# Patient Record
Sex: Female | Born: 1995 | Race: White | Hispanic: No | Smoking: Current every day smoker
Health system: Southern US, Community
[De-identification: ages and names within clinical notes are randomized; demographics above are authoritative.]

## PROBLEM LIST (undated history)

## (undated) ENCOUNTER — Inpatient Hospital Stay: Payer: Self-pay

## (undated) DIAGNOSIS — F909 Attention-deficit hyperactivity disorder, unspecified type: Secondary | ICD-10-CM

## (undated) DIAGNOSIS — K219 Gastro-esophageal reflux disease without esophagitis: Secondary | ICD-10-CM

## (undated) DIAGNOSIS — K589 Irritable bowel syndrome without diarrhea: Secondary | ICD-10-CM

## (undated) DIAGNOSIS — F419 Anxiety disorder, unspecified: Secondary | ICD-10-CM

## (undated) DIAGNOSIS — Z8614 Personal history of Methicillin resistant Staphylococcus aureus infection: Secondary | ICD-10-CM

## (undated) DIAGNOSIS — F329 Major depressive disorder, single episode, unspecified: Secondary | ICD-10-CM

## (undated) DIAGNOSIS — F32A Depression, unspecified: Secondary | ICD-10-CM

## (undated) DIAGNOSIS — R51 Headache: Secondary | ICD-10-CM

## (undated) DIAGNOSIS — J45909 Unspecified asthma, uncomplicated: Secondary | ICD-10-CM

## (undated) DIAGNOSIS — N189 Chronic kidney disease, unspecified: Secondary | ICD-10-CM

## (undated) DIAGNOSIS — N390 Urinary tract infection, site not specified: Secondary | ICD-10-CM

## (undated) DIAGNOSIS — N12 Tubulo-interstitial nephritis, not specified as acute or chronic: Secondary | ICD-10-CM

## (undated) DIAGNOSIS — R519 Headache, unspecified: Secondary | ICD-10-CM

## (undated) DIAGNOSIS — F4310 Post-traumatic stress disorder, unspecified: Secondary | ICD-10-CM

## (undated) DIAGNOSIS — G589 Mononeuropathy, unspecified: Secondary | ICD-10-CM

## (undated) HISTORY — DX: Post-traumatic stress disorder, unspecified: F43.10

## (undated) HISTORY — DX: Unspecified asthma, uncomplicated: J45.909

## (undated) HISTORY — PX: KIDNEY SURGERY: SHX687

## (undated) HISTORY — DX: Attention-deficit hyperactivity disorder, unspecified type: F90.9

## (undated) HISTORY — DX: Urinary tract infection, site not specified: N39.0

## (undated) HISTORY — DX: Irritable bowel syndrome, unspecified: K58.9

---
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
## 2014-03-12 ENCOUNTER — Emergency Department: Payer: Self-pay | Admitting: Emergency Medicine

## 2014-07-12 ENCOUNTER — Emergency Department: Payer: Self-pay | Admitting: Emergency Medicine

## 2014-07-12 LAB — URINALYSIS, COMPLETE
BILIRUBIN, UR: NEGATIVE
Blood: NEGATIVE
Glucose,UR: NEGATIVE mg/dL (ref 0–75)
Hyaline Cast: 2
Nitrite: NEGATIVE
PH: 5 (ref 4.5–8.0)
RBC,UR: 27 /HPF (ref 0–5)
Specific Gravity: 1.033 (ref 1.003–1.030)
Squamous Epithelial: 13
WBC UR: 18 /HPF (ref 0–5)

## 2014-07-12 LAB — PREGNANCY, URINE: Pregnancy Test, Urine: NEGATIVE m[IU]/mL

## 2014-07-14 LAB — URINE CULTURE

## 2014-08-14 ENCOUNTER — Emergency Department: Payer: Self-pay | Admitting: Emergency Medicine

## 2014-09-27 ENCOUNTER — Emergency Department: Payer: Self-pay | Admitting: Emergency Medicine

## 2014-11-23 NOTE — H&P (Signed)
L&D Evaluation:  History:  HPI 19 yo G1 @ 5275w5d gestation by 6wk US derived EDD of 02/13/13 presenting to clinic reporting intermitten menstrual type cramping and loss of mucous plug yesterday.  Noted to be 1cm dilated on susbequent exam, FFN was collected.  No LOF, no VB.  O positive / ABSC neg / RI / VZU / RPR NR / HIV neg / HBsAg neg / 1st Trimester screen negative / 1-hr OGTT 85   Patient's Medical History No Chronic Illness   Patient's Surgical History none   Medications Pre Natal Vitamins   Allergies NKDA   Social History none   Family History Non-Contributory   ROS:  ROS All systems were reviewed.  HEENT, CNS, GI, GU, Respiratory, CV, Renal and Musculoskeletal systems were found to be normal.   Exam:  Vital Signs stable   General no apparent distress   Abdomen gravid, non-tender   Estimated Fetal Weight Average for gestational age   Edema no edema   Pelvic 1/50/-3 in clinic   Mebranes Intact   FHT normal rate with no decels   Ucx absent   Impression:  Impression PTL   Plan:  Plan EFM/NST, monitor contractions and for cervical change   Comments - FFN sent - start BMZ  - Depending on whether monitoring reveals any contractions or cervical change will detmine whetehr to follow outpatient or continue inpatient monitoring.   Electronic Signatures: Lorrene ReidStaebler, Deadrian Toya M (MD)  (Signed 21-May-14 11:28)  Authored: L&D Evaluation   Last Updated: 21-May-14 11:28 by Lorrene ReidStaebler, Ludwika Rodd M (MD)

## 2014-11-23 NOTE — H&P (Signed)
L&D Evaluation:  History:  HPI 19 yo G1 at 583w6d gestational age by 6 week ultrasound. Pregnancy complicated by preterm labor at 31 weeks. She is status post BMTZ 1 week ago for preterm labor. She has been having contractions for several weeks, which she states have not changed in intensity as of today.  She presented to clinic this afternoon after noticing vaginal bleeding. Of note, she had sexual intercourse this morning. She had a speculum exam that did reveal a small amount of blood in the vaginal vault.  Her cervix was checked and found to be 2 cm (up from 1 cm 1 week ago).  She was sent to L&D for further evaluation.  She notes positive fetal movement, she thinks her bleeding has slowed down.  She notes contractions, as above.  Denies leakage of fluid.   O+ / VZI / HBsAg neg / RI / RPR NR / GBS unk   Patient's Medical History No Chronic Illness   Patient's Surgical History bladder surgery   Medications Pre Natal Vitamins  flexeril   Allergies amoxicillin/PCN   Social History none   Family History Non-Contributory   ROS:  ROS All systems were reviewed.  HEENT, CNS, GI, GU, Respiratory, CV, Renal and Musculoskeletal systems were found to be normal., unless noted in HPI   Exam:  Vital Signs stable   Urine Protein not completed   General no apparent distress   Mental Status clear   Chest clear   Heart normal sinus rhythm   Abdomen gravid, non-tender   Estimated Fetal Weight Average for gestational age   Fetal Position cephalic by ultrasound   Back no CVAT   Edema no edema   Pelvic no external lesions, on sterile speculum exam there was ~5-5510mL blood (not dark brown, but not bright red either). No active lesions noteed, no active bleeding from Os identified.  Cvx 2/30/-3   Mebranes Intact   FHT normal rate with no decels   Ucx irregular   Other Bedside ultrasound performed by me showed fetus in cephalic presentation, grossly normal fluid, +fetal movement.   Placenta in patients right.  Edges evaluated and no obvious abruption, active bleeding, or hematomas noted.   Impression:  Impression 1) Intrauterine pregnancy at 363w6d gestational age 72) preterm labor (not laboring currently), 3) third trimester vaginal bleeding, source unknown   Plan:  Plan EFM/NST, monitor contractions and for cervical change   Comments * Cervical testing for gonorrhea/chlamydia sent * recommended to patient to stay in house for observation with concern for abruption since I could not definitively point to a source of her bleeding.  I explained that if her bleeding subsided, then would could more safely discharge her.  The patient explained that she had her mother's car and needed to pick her mother up and that she needed to leave.  Given her situation and her unknown cause for her ongoing bleeding in the 3rd trimester, I recommended that she stay in-house.  The patient then stated that she needed to leave. I reviewed the potiential ramifications of the patient leaving the hospital against medical advice, including but not limited to fetal and/or maternal death in the worst case.  She expressed understanding of this as a risk.  She asked what sort of followup I would recommend. I recommended that she be seen tomorrow in clinic to assess her bleeding at that time.  The patient agreed to this alternative plan given her desire to leave against medical advice.   Follow Up  Appointment need to schedule. tomorrow.   Electronic Signatures: Conard NovakJackson, Ruble Pumphrey D (MD)  (Signed 05-Jun-14 21:18)  Authored: L&D Evaluation   Last Updated: 05-Jun-14 21:18 by Conard NovakJackson, Cyniah Gossard D (MD)

## 2015-03-05 ENCOUNTER — Encounter: Payer: Self-pay | Admitting: Emergency Medicine

## 2015-03-05 ENCOUNTER — Emergency Department
Admission: EM | Admit: 2015-03-05 | Discharge: 2015-03-05 | Disposition: A | Discharge status: Home / Self Care | Payer: Medicaid Other | Attending: Emergency Medicine | Admitting: Emergency Medicine

## 2015-03-05 DIAGNOSIS — Z720 Tobacco use: Secondary | ICD-10-CM | POA: Insufficient documentation

## 2015-03-05 DIAGNOSIS — N739 Female pelvic inflammatory disease, unspecified: Secondary | ICD-10-CM | POA: Insufficient documentation

## 2015-03-05 DIAGNOSIS — Z880 Allergy status to penicillin: Secondary | ICD-10-CM | POA: Insufficient documentation

## 2015-03-05 DIAGNOSIS — R51 Headache: Secondary | ICD-10-CM | POA: Insufficient documentation

## 2015-03-05 DIAGNOSIS — R1030 Lower abdominal pain, unspecified: Secondary | ICD-10-CM | POA: Diagnosis present

## 2015-03-05 DIAGNOSIS — Z3202 Encounter for pregnancy test, result negative: Secondary | ICD-10-CM | POA: Insufficient documentation

## 2015-03-05 DIAGNOSIS — R519 Headache, unspecified: Secondary | ICD-10-CM

## 2015-03-05 HISTORY — DX: Tubulo-interstitial nephritis, not specified as acute or chronic: N12

## 2015-03-05 LAB — CBC WITH DIFFERENTIAL/PLATELET
Basophils Absolute: 0.1 10*3/uL (ref 0–0.1)
Basophils Relative: 1 %
Eosinophils Absolute: 0.3 10*3/uL (ref 0–0.7)
Eosinophils Relative: 3 %
HEMATOCRIT: 43.1 % (ref 35.0–47.0)
HEMOGLOBIN: 14.6 g/dL (ref 12.0–16.0)
LYMPHS ABS: 2.1 10*3/uL (ref 1.0–3.6)
Lymphocytes Relative: 22 %
MCH: 30.2 pg (ref 26.0–34.0)
MCHC: 33.8 g/dL (ref 32.0–36.0)
MCV: 89.4 fL (ref 80.0–100.0)
MONOS PCT: 6 %
Monocytes Absolute: 0.6 10*3/uL (ref 0.2–0.9)
NEUTROS ABS: 6.8 10*3/uL — AB (ref 1.4–6.5)
NEUTROS PCT: 68 %
Platelets: 241 10*3/uL (ref 150–440)
RBC: 4.82 MIL/uL (ref 3.80–5.20)
RDW: 13.3 % (ref 11.5–14.5)
WBC: 9.9 10*3/uL (ref 3.6–11.0)

## 2015-03-05 LAB — URINALYSIS COMPLETE WITH MICROSCOPIC (ARMC ONLY)
BACTERIA UA: NONE SEEN
Bilirubin Urine: NEGATIVE
GLUCOSE, UA: NEGATIVE mg/dL
Hgb urine dipstick: NEGATIVE
Ketones, ur: NEGATIVE mg/dL
NITRITE: NEGATIVE
Protein, ur: NEGATIVE mg/dL
SPECIFIC GRAVITY, URINE: 1.023 (ref 1.005–1.030)
pH: 5 (ref 5.0–8.0)

## 2015-03-05 LAB — COMPREHENSIVE METABOLIC PANEL
ALBUMIN: 4.4 g/dL (ref 3.5–5.0)
ALK PHOS: 88 U/L (ref 38–126)
ALT: 23 U/L (ref 14–54)
ANION GAP: 7 (ref 5–15)
AST: 24 U/L (ref 15–41)
BILIRUBIN TOTAL: 0.2 mg/dL — AB (ref 0.3–1.2)
BUN: 12 mg/dL (ref 6–20)
CALCIUM: 9.2 mg/dL (ref 8.9–10.3)
CO2: 26 mmol/L (ref 22–32)
CREATININE: 0.78 mg/dL (ref 0.44–1.00)
Chloride: 107 mmol/L (ref 101–111)
GFR calc non Af Amer: >60 mL/min (ref >60)
GLUCOSE: 95 mg/dL (ref 65–99)
Potassium: 3.9 mmol/L (ref 3.5–5.1)
Sodium: 140 mmol/L (ref 135–145)
TOTAL PROTEIN: 7.4 g/dL (ref 6.5–8.1)

## 2015-03-05 LAB — WET PREP, GENITAL
CLUE CELLS WET PREP: NONE SEEN
TRICH WET PREP: NONE SEEN
YEAST WET PREP: NONE SEEN

## 2015-03-05 LAB — CHLAMYDIA/NGC RT PCR (ARMC ONLY)
Chlamydia Tr: DETECTED — AB
N gonorrhoeae: NOT DETECTED

## 2015-03-05 LAB — POCT PREGNANCY, URINE: PREG TEST UR: NEGATIVE

## 2015-03-05 LAB — LIPASE, BLOOD: Lipase: 20 U/L — ABNORMAL LOW (ref 22–51)

## 2015-03-05 MED ORDER — DOXYCYCLINE HYCLATE 100 MG PO TABS: 100.0000 mg | ORAL_TABLET | Freq: Two times a day (BID) | ORAL | Status: DC

## 2015-03-05 MED ORDER — AZITHROMYCIN 250 MG PO TABS
1000.0000 mg | ORAL_TABLET | Freq: Once | ORAL | Status: AC
  Administered 2015-03-05: 1000 mg via ORAL
  Filled 2015-03-05: qty 4

## 2015-03-05 MED ORDER — KETOROLAC TROMETHAMINE 60 MG/2ML IM SOLN
60.0000 mg | Freq: Once | INTRAMUSCULAR | Status: AC
  Administered 2015-03-05: 60 mg via INTRAMUSCULAR
  Filled 2015-03-05: qty 2

## 2015-03-05 MED ORDER — DIPHENHYDRAMINE HCL 50 MG PO CAPS
50.0000 mg | ORAL_CAPSULE | Freq: Once | ORAL | Status: AC
  Administered 2015-03-05: 50 mg via ORAL
  Filled 2015-03-05: qty 1

## 2015-03-05 MED ORDER — TRAMADOL HCL 50 MG PO TABS: 50.0000 mg | ORAL_TABLET | Freq: Four times a day (QID) | ORAL | Status: AC | PRN

## 2015-03-05 MED ORDER — DIPHENHYDRAMINE HCL 50 MG/ML IJ SOLN: 50.0000 mg | Freq: Once | INTRAMUSCULAR | Status: DC

## 2015-03-05 MED ORDER — CEFTRIAXONE SODIUM 250 MG IJ SOLR
250.0000 mg | Freq: Once | INTRAMUSCULAR | Status: AC
Start: 2015-03-05 — End: 2015-03-05
  Administered 2015-03-05: 250 mg via INTRAMUSCULAR
  Filled 2015-03-05: qty 250

## 2015-03-05 MED ORDER — METOCLOPRAMIDE HCL 10 MG PO TABS
10.0000 mg | ORAL_TABLET | Freq: Once | ORAL | Status: AC
  Administered 2015-03-05: 10 mg via ORAL
  Filled 2015-03-05: qty 1

## 2015-03-05 NOTE — ED Notes (Signed)
Abdominal cramping, headache and backache x 1 week

## 2015-03-05 NOTE — Discharge Instructions (Signed)
You have been seen in the emergency department for a headache, abdominal pain. As we discussed her workup and most consistent with pelvic inflammatory disease. Please take your entire course of antibiotics as written, pain medication as needed, and follow-up with your primary care provider this week for recheck. Return to the emergency department if your pain worsens, you develop a fever, or any other symptom personally concerning to yourself.    Migraine Headache A migraine headache is an intense, throbbing pain on one or both sides of your head. A migraine can last for 30 minutes to several hours. CAUSES  The exact cause of a migraine headache is not always known. However, a migraine may be caused when nerves in the brain become irritated and release chemicals that cause inflammation. This causes pain. Certain things may also trigger migraines, such as:  Alcohol.  Smoking.  Stress.  Menstruation.  Aged cheeses.  Foods or drinks that contain nitrates, glutamate, aspartame, or tyramine.  Lack of sleep.  Chocolate.  Caffeine.  Hunger.  Physical exertion.  Fatigue.  Medicines used to treat chest pain (nitroglycerine), birth control pills, estrogen, and some blood pressure medicines. SIGNS AND SYMPTOMS  Pain on one or both sides of your head.  Pulsating or throbbing pain.  Severe pain that prevents daily activities.  Pain that is aggravated by any physical activity.  Nausea, vomiting, or both.  Dizziness.  Pain with exposure to bright lights, loud noises, or activity.  General sensitivity to bright lights, loud noises, or smells. Before you get a migraine, you may get warning signs that a migraine is coming (aura). An aura may include:  Seeing flashing lights.  Seeing bright spots, halos, or zigzag lines.  Having tunnel vision or blurred vision.  Having feelings of numbness or tingling.  Having trouble talking.  Having muscle weakness. DIAGNOSIS  A  migraine headache is often diagnosed based on:  Symptoms.  Physical exam.  A CT scan or MRI of your head. These imaging tests cannot diagnose migraines, but they can help rule out other causes of headaches. TREATMENT Medicines may be given for pain and nausea. Medicines can also be given to help prevent recurrent migraines.  HOME CARE INSTRUCTIONS  Only take over-the-counter or prescription medicines for pain or discomfort as directed by your health care provider. The use of long-term narcotics is not recommended.  Lie down in a dark, quiet room when you have a migraine.  Keep a journal to find out what may trigger your migraine headaches. For example, write down:  What you eat and drink.  How much sleep you get.  Any change to your diet or medicines.  Limit alcohol consumption.  Quit smoking if you smoke.  Get 7-9 hours of sleep, or as recommended by your health care provider.  Limit stress.  Keep lights dim if bright lights bother you and make your migraines worse. SEEK IMMEDIATE MEDICAL CARE IF:   Your migraine becomes severe.  You have a fever.  You have a stiff neck.  You have vision loss.  You have muscular weakness or loss of muscle control.  You start losing your balance or have trouble walking.  You feel faint or pass out.  You have severe symptoms that are different from your first symptoms. MAKE SURE YOU:   Understand these instructions.  Will watch your condition.  Will get help right away if you are not doing well or get worse. Document Released: 07/02/2005 Document Revised: 11/16/2013 Document Reviewed: 03/09/2013 [employer] Patient  Information 2015 [place], [place]. This information is not intended to replace advice given to you by your health care provider. Make sure you discuss any questions you have with your health care provider.  Pelvic Inflammatory Disease Pelvic inflammatory disease (PID) is an infection in some or all of the female  organs. PID can be in the uterus, ovaries, fallopian tubes, or the surrounding tissues inside the lower belly area (pelvis). HOME CARE   If given, take your antibiotic medicine as told. Finish them even if you start to feel better.  Only take medicine as told by your doctor.  Do not have sex (intercourse) until treatment is done or as told by your doctor.  Tell your sex partner if you have PID. Your partner may need to be treated.  Keep all doctor visits. GET HELP RIGHT AWAY IF:   You have a fever.  You have more belly (abdominal) or lower belly pain.  You have chills.  You have pain when you pee (urinate).  You are not better after 72 hours.  You have more fluid (discharge) coming from your vagina or fluid that is not normal.  You need pain medicine from your doctor.  You throw up (vomit).  You cannot take your medicines.  Your partner has a sexually transmitted disease (STD). MAKE SURE YOU:   Understand these instructions.  Will watch your condition.  Will get help right away if you are not doing well or get worse. Document Released: 09/28/2008 Document Revised: 10/27/2012 Document Reviewed: 06/28/2011 [facility] Patient Information 2015 [place], [place]. This information is not intended to replace advice given to you by your health care provider. Make sure you discuss any questions you have with your health care provider.

## 2015-03-05 NOTE — ED Provider Notes (Addendum)
[facility] Emergency Department Provider Note  Time seen: 2:23 PM  I have reviewed the triage vital signs and the nursing notes.   HISTORY  Chief Complaint Abdominal Pain    HPI Molly Bray is a 19 y.o. female with a past medical history of pyelonephritis presents the emergency department with 1 week of vaginal discharge, lower abdominal cramping, and a headache for the past 3 days. According to the patient for the past one week she has noticed increased vaginal discharge along with lower abdominal cramping. She has also noted a headache over the past 3 days, worse with loud noise or light. Describes her abdominal cramping as moderate, and discharge is mild to moderate.     Past Medical History  Diagnosis Date  . Pyelonephritis     There are no active problems to display for this patient.   Past Surgical History  Procedure Laterality Date  . Kidney surgery      No current outpatient prescriptions on file.  Allergies Amoxicillin and Penicillins  No family history on file.  Social History Social History  Substance Use Topics  . Smoking status: Current Some Day Smoker  . Smokeless tobacco: None  . Alcohol Use: No    Review of Systems Constitutional: Negative for fever Cardiovascular: Negative for chest pain. Respiratory: Negative for shortness of breath. Gastrointestinal: Positive for lower abdominal cramping. Genitourinary: Negative for dysuria. Positive for vaginal discharge. Musculoskeletal: Some/mild lower back pain 10-point ROS otherwise negative.  ____________________________________________   PHYSICAL EXAM:  VITAL SIGNS: ED Triage Vitals  Enc Vitals Group     BP 03/05/15 1259 119/77 mmHg     Pulse Rate 03/05/15 1259 98     Resp --      Temp 03/05/15 1259 98.7 F (37.1 C)     Temp Source 03/05/15 1259 Oral     SpO2 03/05/15 1259 97 %     Weight 03/05/15 1259 169 lb (76.658 kg)     Height 03/05/15 1259  (1.676  m)     Head Cir --      Peak Flow --      Pain Score 03/05/15 1259 8     Pain Loc --      Pain Edu? --      Excl. in GC? --     Constitutional: Alert and oriented. Well appearing and in no distress. ENT   Mouth/Throat: Mucous membranes are moist. Cardiovascular: Normal rate, regular rhythm. No murmur Respiratory: Normal respiratory effort without tachypnea nor retractions. Breath sounds are clear and equal bilaterally. No wheezes/rales/rhonchi. Gastrointestinal: Soft, mild lower abdominal tenderness palpation. No rebound or guarding. No CVA tenderness palpation. GU: Moderate cervical discharge white/yellow, moderate cervical motion tenderness, no adnexal tenderness. Musculoskeletal: Nontender with normal range of motion in all extremities.  Neurologic:  Normal speech and language. No gross focal neurologic deficits Skin:  Skin is warm, dry and intact.  Psychiatric: Mood and affect are normal. Speech and behavior are normal.   ____________________________________________    INITIAL IMPRESSION / ASSESSMENT AND PLAN / ED COURSE  Pertinent labs & imaging results that were available during my care of the patient were reviewed by me and considered in my medical decision making (see chart for details).  Patient with lower abdominal pain as well as vaginal discharge for one week. Labs are within normal limits. We will check a pelvic exam and send swabs. We will treat for chlamydia with azithromycin, but as the patient has an anaphylactic penicillin allergy  we will hold off on gonorrhea treatment until test results return. Patient is agreeable to that plan, and if her gonorrhea test results positive she will return to the emergency department for further treatment. I discussed with the patient to avoid sexual contact until test results are known. Patient also complaining of a headache which she describes as moderate, which has previously worsened over the past 3 days now so she will phone on  photophobia. We will treat with Toradol, Reglan and Benadryl and monitor for response.  Patient states she has had Rocephin in the past for STD treatments without any ill effects. Patient is positive it was Rocephin. We will dose Rocephin. On pelvic exam the patient has moderate cervical motion tenderness, moderate white/yellow discharge. Otherwise benign exam. We will treat with doxycycline for likely pelvic inflammatory disease. Patient agreeable to plan.  ____________________________________________   FINAL CLINICAL IMPRESSION(S) / ED DIAGNOSES  Headache/migraine Abdominal cramping Vaginal discharge   Minna Antis, MD 03/05/15 1457  Minna Antis, MD 03/05/15 [phone]

## 2015-03-09 ENCOUNTER — Telehealth: Payer: Self-pay | Admitting: Emergency Medicine

## 2015-03-09 NOTE — ED Notes (Signed)
Called pt to inform of positive chlamydia test.  Pt says she has not filled the rx for doxycycline yet due to cost.  Gave her number for [facility]D [facility].  I told her that if she was unable to get meds there for some reason, she should go to medication management.  She agrees.

## 2015-04-19 ENCOUNTER — Emergency Department
Admission: EM | Admit: 2015-04-19 | Discharge: 2015-04-19 | Disposition: A | Discharge status: Home / Self Care | Payer: Medicaid Other | Attending: Emergency Medicine | Admitting: Emergency Medicine

## 2015-04-19 ENCOUNTER — Encounter: Payer: Self-pay | Admitting: Emergency Medicine

## 2015-04-19 DIAGNOSIS — R197 Diarrhea, unspecified: Secondary | ICD-10-CM | POA: Insufficient documentation

## 2015-04-19 DIAGNOSIS — N3090 Cystitis, unspecified without hematuria: Secondary | ICD-10-CM | POA: Insufficient documentation

## 2015-04-19 DIAGNOSIS — Z720 Tobacco use: Secondary | ICD-10-CM | POA: Diagnosis not present

## 2015-04-19 DIAGNOSIS — Z3202 Encounter for pregnancy test, result negative: Secondary | ICD-10-CM | POA: Diagnosis not present

## 2015-04-19 DIAGNOSIS — N390 Urinary tract infection, site not specified: Secondary | ICD-10-CM

## 2015-04-19 DIAGNOSIS — Z880 Allergy status to penicillin: Secondary | ICD-10-CM | POA: Insufficient documentation

## 2015-04-19 DIAGNOSIS — N730 Acute parametritis and pelvic cellulitis: Secondary | ICD-10-CM

## 2015-04-19 DIAGNOSIS — N739 Female pelvic inflammatory disease, unspecified: Secondary | ICD-10-CM | POA: Insufficient documentation

## 2015-04-19 DIAGNOSIS — R1030 Lower abdominal pain, unspecified: Secondary | ICD-10-CM | POA: Diagnosis present

## 2015-04-19 LAB — WET PREP, GENITAL
CLUE CELLS WET PREP: NONE SEEN
TRICH WET PREP: NONE SEEN
YEAST WET PREP: NONE SEEN

## 2015-04-19 LAB — CBC
HCT: 44.2 % (ref 35.0–47.0)
HEMOGLOBIN: 14.6 g/dL (ref 12.0–16.0)
MCH: 29.9 pg (ref 26.0–34.0)
MCHC: 33 g/dL (ref 32.0–36.0)
MCV: 90.4 fL (ref 80.0–100.0)
Platelets: 251 10*3/uL (ref 150–440)
RBC: 4.88 MIL/uL (ref 3.80–5.20)
RDW: 13.5 % (ref 11.5–14.5)
WBC: 17.1 10*3/uL — ABNORMAL HIGH (ref 3.6–11.0)

## 2015-04-19 LAB — COMPREHENSIVE METABOLIC PANEL
ALBUMIN: 4.3 g/dL (ref 3.5–5.0)
ALK PHOS: 90 U/L (ref 38–126)
ALT: 15 U/L (ref 14–54)
ANION GAP: 6 (ref 5–15)
AST: 19 U/L (ref 15–41)
BUN: 13 mg/dL (ref 6–20)
CALCIUM: 9.3 mg/dL (ref 8.9–10.3)
CHLORIDE: 105 mmol/L (ref 101–111)
CO2: 28 mmol/L (ref 22–32)
CREATININE: 0.8 mg/dL (ref 0.44–1.00)
GFR calc Af Amer: >60 mL/min (ref >60)
GFR calc non Af Amer: >60 mL/min (ref >60)
GLUCOSE: 116 mg/dL — AB (ref 65–99)
Potassium: 3.6 mmol/L (ref 3.5–5.1)
SODIUM: 139 mmol/L (ref 135–145)
Total Bilirubin: 1 mg/dL (ref 0.3–1.2)
Total Protein: 7.5 g/dL (ref 6.5–8.1)

## 2015-04-19 LAB — URINALYSIS COMPLETE WITH MICROSCOPIC (ARMC ONLY)
Bilirubin Urine: NEGATIVE
Glucose, UA: NEGATIVE mg/dL
Hgb urine dipstick: NEGATIVE
KETONES UR: NEGATIVE mg/dL
Nitrite: NEGATIVE
PH: 6 (ref 5.0–8.0)
PROTEIN: NEGATIVE mg/dL
Specific Gravity, Urine: 1.029 (ref 1.005–1.030)

## 2015-04-19 LAB — CHLAMYDIA/NGC RT PCR (ARMC ONLY)
CHLAMYDIA TR: NOT DETECTED
N gonorrhoeae: NOT DETECTED

## 2015-04-19 LAB — POCT PREGNANCY, URINE: PREG TEST UR: NEGATIVE

## 2015-04-19 MED ORDER — AZITHROMYCIN 250 MG PO TABS
ORAL_TABLET | ORAL | Status: AC
  Administered 2015-04-19: 1000 mg via ORAL
  Filled 2015-04-19: qty 4

## 2015-04-19 MED ORDER — OXYCODONE-ACETAMINOPHEN 5-325 MG PO TABS
2.0000 | ORAL_TABLET | Freq: Once | ORAL | Status: AC
Start: 2015-04-19 — End: 2015-04-19
  Administered 2015-04-19: 2 via ORAL
  Filled 2015-04-19: qty 2

## 2015-04-19 MED ORDER — METRONIDAZOLE 500 MG PO TABS: 500.0000 mg | ORAL_TABLET | Freq: Two times a day (BID) | ORAL | Status: DC

## 2015-04-19 MED ORDER — CIPROFLOXACIN IN D5W 400 MG/200ML IV SOLN
INTRAVENOUS | Status: AC
  Administered 2015-04-19: 400 mg via INTRAVENOUS
  Filled 2015-04-19: qty 200

## 2015-04-19 MED ORDER — DOXYCYCLINE HYCLATE 100 MG PO CAPS: 100.0000 mg | ORAL_CAPSULE | Freq: Two times a day (BID) | ORAL | Status: DC

## 2015-04-19 MED ORDER — AZITHROMYCIN 250 MG PO TABS
1000.0000 mg | ORAL_TABLET | Freq: Once | ORAL | Status: AC
  Administered 2015-04-19: 1000 mg via ORAL

## 2015-04-19 MED ORDER — CIPROFLOXACIN IN D5W 400 MG/200ML IV SOLN
400.0000 mg | Freq: Once | INTRAVENOUS | Status: AC
  Administered 2015-04-19: 400 mg via INTRAVENOUS
  Filled 2015-04-19: qty 200

## 2015-04-19 MED ORDER — OXYCODONE-ACETAMINOPHEN 5-325 MG PO TABS: 2.0000 | ORAL_TABLET | Freq: Four times a day (QID) | ORAL | Status: DC | PRN

## 2015-04-19 NOTE — ED Notes (Addendum)
C/o RLQ abd.  Pain since last night, with diarrhea and nausea, also having some painful urination

## 2015-04-19 NOTE — Discharge Instructions (Signed)
Pelvic Inflammatory Disease °Pelvic inflammatory disease (PID) refers to an infection in some or all of the female organs. The infection can be in the uterus, ovaries, fallopian tubes, or the surrounding tissues in the pelvis. PID can cause abdominal or pelvic pain that comes on suddenly (acute pelvic pain). PID is a serious infection because it can lead to lasting (chronic) pelvic pain or the inability to have children (infertility). °CAUSES °This condition is most often caused by an infection that is spread during sexual contact. However, the infection can also be caused by the normal bacteria that are found in the vaginal tissues if these bacteria travel upward into the reproductive organs. PID can also occur following: °· The birth of a baby. °· A miscarriage. °· An abortion. °· Major pelvic surgery. °· The use of an intrauterine device (IUD). °· A sexual assault. °RISK FACTORS °This condition is more likely to develop in women who: °· Are younger than 20 years of age. °· Are sexually active at a young age. °· Use nonbarrier contraception. °· Have multiple sexual partners. °· Have sex with someone who has symptoms of an STD (sexually transmitted disease). °· Use oral contraception. °At times, certain behaviors can also increase the possibility of getting PID, such as: °· Using a vaginal douche. °· Having an IUD in place. °SYMPTOMS °Symptoms of this condition include: °· Abdominal or pelvic pain. °· Fever. °· Chills. °· Abnormal vaginal discharge. °· Abnormal uterine bleeding. °· Unusual pain shortly after the end of a menstrual period. °· Painful urination. °· Pain with sexual intercourse. °· Nausea and vomiting. °DIAGNOSIS °To diagnose this condition, your health care provider will do a physical exam and take your medical history. A pelvic exam typically reveals great tenderness in the uterus and the surrounding pelvic tissues. You may also have tests, such as: °· Lab tests, including a pregnancy test, blood  tests, and urine test. °· Culture tests of the vagina and cervix to check for an STD. °· Ultrasound. °· A laparoscopic procedure to look inside the pelvis. °· Examining vaginal secretions under a microscope. °TREATMENT °Treatment for this condition may involve one or more approaches. °· Antibiotic medicines may be prescribed to be taken by mouth. °· Sexual partners may need to be treated if the infection is caused by an STD. °· For more severe cases, hospitalization may be needed to give antibiotics directly into a vein through an IV tube. °· Surgery may be needed if other treatments do not help, but this is rare. °It may take weeks until you are completely well. If you are diagnosed with PID, you should also be checked for human immunodeficiency virus (HIV). Your health care provider may test you for infection again 3 months after treatment. You should not have unprotected sex. °HOME CARE INSTRUCTIONS °· Take over-the-counter and prescription medicines only as told by your health care provider. °· If you were prescribed an antibiotic medicine, take it as told by your health care provider. Do not stop taking the antibiotic even if you start to feel better. °· Do not have sexual intercourse until treatment is completed or as told by your health care provider. If PID is confirmed, your recent sexual partners will need treatment, especially if you had unprotected sex. °· Keep all follow-up visits as told by your health care provider. This is important. °SEEK MEDICAL CARE IF: °· You have increased or abnormal vaginal discharge. °· Your pain does not improve. °· You vomit. °· You have a fever. °· You   cannot tolerate your medicines. °· Your partner has an STD. °· You have pain when you urinate. °SEEK IMMEDIATE MEDICAL CARE IF: °· You have increased abdominal or pelvic pain. °· You have chills. °· Your symptoms are not better in 72 hours even with treatment. °  °This information is not intended to replace advice given to  you by your health care provider. Make sure you discuss any questions you have with your health care provider. °  °Document Released: 07/02/2005 Document Revised: 03/23/2015 Document Reviewed: 08/09/2014 °Elsevier Interactive Patient Education ©2016 [employer] ° °Urinary Tract Infection °A urinary tract infection (UTI) can occur any place along the urinary tract. The tract includes the kidneys, ureters, bladder, and urethra. A type of germ called bacteria often causes a UTI. UTIs are often helped with antibiotic medicine.  °HOME CARE  °· If given, take antibiotics as told by your doctor. Finish them even if you start to feel better. °· Drink enough fluids to keep your pee (urine) clear or pale yellow. °· Avoid tea, drinks with caffeine, and bubbly (carbonated) drinks. °· Pee often. Avoid holding your pee in for a long time. °· Pee before and after having sex (intercourse). °· Wipe from front to back after you poop (bowel movement) if you are a woman. Use each tissue only once. °GET HELP RIGHT AWAY IF:  °· You have back pain. °· You have lower belly (abdominal) pain. °· You have chills. °· You feel sick to your stomach (nauseous). °· You throw up (vomit). °· Your burning or discomfort with peeing does not go away. °· You have a fever. °· Your symptoms are not better in 3 days. °MAKE SURE YOU:  °· Understand these instructions. °· Will watch your condition. °· Will get help right away if you are not doing well or get worse. °  °This information is not intended to replace advice given to you by your health care provider. Make sure you discuss any questions you have with your health care provider. °  °Document Released: 12/19/2007 Document Revised: 07/23/2014 Document Reviewed: 01/31/2012 °Elsevier Interactive Patient Education ©2016 [employer] ° °

## 2015-04-19 NOTE — ED Provider Notes (Signed)
[facility] Emergency Department Provider Note     Time seen: ----------------------------------------- 5:37 PM on 04/19/2015 -----------------------------------------    I have reviewed the triage vital signs and the nursing notes.   HISTORY  Chief Complaint Abdominal Pain    HPI Molly Bray is a 19 y.o. female who presents ER with lower abdominal pain that radiates to the right side. Patient states she's had painless since last night, has had diarrhea and nausea. Also having some painful urination. Urination makes her symptoms worse, nothing makes it better.She also notes vaginal discharge, no bleeding.   Past Medical History  Diagnosis Date  . Pyelonephritis     There are no active problems to display for this patient.   Past Surgical History  Procedure Laterality Date  . Kidney surgery      Allergies Amoxicillin and Penicillins  Social History Social History  Substance Use Topics  . Smoking status: Current Some Day Smoker  . Smokeless tobacco: None  . Alcohol Use: No    Review of Systems Constitutional: Negative for fever. Eyes: Negative for visual changes. ENT: Negative for sore throat. Cardiovascular: Negative for chest pain. Respiratory: Negative for shortness of breath. Gastrointestinal: Positive for abdominal pain with nausea and diarrhea Genitourinary: Positive for dysuria Musculoskeletal: Negative for back pain. Skin: Negative for rash. Neurological: Negative for headaches, focal weakness or numbness.  10-point ROS otherwise negative.  ____________________________________________   PHYSICAL EXAM:  VITAL SIGNS: ED Triage Vitals  Enc Vitals Group     BP 04/19/15 1627 119/67 mmHg     Pulse Rate 04/19/15 1627 105     Resp 04/19/15 1627 18     Temp 04/19/15 1627 98.2 F (36.8 C)     Temp Source 04/19/15 1627 Oral     SpO2 04/19/15 1627 98 %     Weight 04/19/15 1627 170 lb (77.111 kg)     Height 04/19/15  1627  (1.676 m)     Head Cir --      Peak Flow --      Pain Score 04/19/15 1628 10     Pain Loc --      Pain Edu? --      Excl. in GC? --     Constitutional: Alert and oriented. Well appearing and in no distress. Eyes: Conjunctivae are normal. PERRL. Normal extraocular movements. ENT   Head: Normocephalic and atraumatic.   Nose: No congestion/rhinnorhea.   Mouth/Throat: Mucous membranes are moist.   Neck: No stridor. Cardiovascular: Normal rate, regular rhythm. Normal and symmetric distal pulses are present in all extremities. No murmurs, rubs, or gallops. Respiratory: Normal respiratory effort without tachypnea nor retractions. Breath sounds are clear and equal bilaterally. No wheezes/rales/rhonchi. Gastrointestinal: Lower abdominal tenderness, worse on the right. No rebound or guarding. Normal bowel sounds. Musculoskeletal: Nontender with normal range of motion in all extremities. No joint effusions.  No lower extremity tenderness nor edema. Neurologic:  Normal speech and language. No gross focal neurologic deficits are appreciated. Speech is normal. No gait instability. Skin:  Skin is warm, dry and intact. No rash noted. Psychiatric: Mood and affect are normal. Speech and behavior are normal. Patient exhibits appropriate insight and judgment.  ____________________________________________  ED COURSE:  Pertinent labs & imaging results that were available during my care of the patient were reviewed by me and considered in my medical decision making (see chart for details). We'll check basic labs and urine. Patient will need a pelvic examination. ____________________________________________    LABS (pertinent  positives/negatives)  Labs Reviewed  WET PREP, GENITAL - Abnormal; Notable for the following:    WBC, Wet Prep HPF POC MANY (*)    All other components within normal limits  COMPREHENSIVE METABOLIC PANEL - Abnormal; Notable for the following:    Glucose,  Bld 116 (*)    All other components within normal limits  CBC - Abnormal; Notable for the following:    WBC 17.1 (*)    All other components within normal limits  URINALYSIS COMPLETEWITH MICROSCOPIC ([facility] ONLY) - Abnormal; Notable for the following:    Color, Urine AMBER (*)    APPearance CLOUDY (*)    Leukocytes, UA 3+ (*)    Bacteria, UA RARE (*)    Squamous Epithelial / LPF 6-30 (*)    All other components within normal limits  CHLAMYDIA/NGC RT PCR ([facility] ONLY)  POCT PREGNANCY, URINE   ____________________________________________  FINAL ASSESSMENT AND PLAN  Abdominal pain, PID, cystitis  Plan: Patient with labs and imaging as dictated above. Some of her labs likely reflect contaminant from her vaginal discharge into her urine. She is started on Cipro here given 400 mg IV, also 1 g of Zithromax by mouth. She did not have any CVA tenderness, however the Cipro should cover her for pyelonephritis. She'll be discharged with doxycycline, Flagyl and Percocet. She is advised to follow-up in 2 days for recheck.   Emily Filbert, MD   Emily Filbert, MD 04/19/15 [phone]

## 2015-04-24 ENCOUNTER — Emergency Department
Admission: EM | Admit: 2015-04-24 | Discharge: 2015-04-24 | Disposition: A | Discharge status: Home / Self Care | Payer: Medicaid Other | Attending: Emergency Medicine | Admitting: Emergency Medicine

## 2015-04-24 DIAGNOSIS — Z720 Tobacco use: Secondary | ICD-10-CM | POA: Insufficient documentation

## 2015-04-24 DIAGNOSIS — Z880 Allergy status to penicillin: Secondary | ICD-10-CM | POA: Insufficient documentation

## 2015-04-24 DIAGNOSIS — Z3202 Encounter for pregnancy test, result negative: Secondary | ICD-10-CM | POA: Insufficient documentation

## 2015-04-24 DIAGNOSIS — R1030 Lower abdominal pain, unspecified: Secondary | ICD-10-CM | POA: Diagnosis present

## 2015-04-24 DIAGNOSIS — N12 Tubulo-interstitial nephritis, not specified as acute or chronic: Secondary | ICD-10-CM | POA: Insufficient documentation

## 2015-04-24 DIAGNOSIS — Z792 Long term (current) use of antibiotics: Secondary | ICD-10-CM | POA: Diagnosis not present

## 2015-04-24 LAB — CBC WITH DIFFERENTIAL/PLATELET
BASOS ABS: 0.1 10*3/uL (ref 0–0.1)
Basophils Relative: 1 %
EOS PCT: 2 %
Eosinophils Absolute: 0.3 10*3/uL (ref 0–0.7)
HEMATOCRIT: 45.6 % (ref 35.0–47.0)
HEMOGLOBIN: 15.2 g/dL (ref 12.0–16.0)
LYMPHS ABS: 2.9 10*3/uL (ref 1.0–3.6)
LYMPHS PCT: 26 %
MCH: 30.1 pg (ref 26.0–34.0)
MCHC: 33.4 g/dL (ref 32.0–36.0)
MCV: 90.3 fL (ref 80.0–100.0)
Monocytes Absolute: 0.8 10*3/uL (ref 0.2–0.9)
Monocytes Relative: 7 %
NEUTROS ABS: 7.4 10*3/uL — AB (ref 1.4–6.5)
NEUTROS PCT: 64 %
Platelets: 254 10*3/uL (ref 150–440)
RBC: 5.05 MIL/uL (ref 3.80–5.20)
RDW: 13.6 % (ref 11.5–14.5)
WBC: 11.4 10*3/uL — AB (ref 3.6–11.0)

## 2015-04-24 LAB — URINALYSIS COMPLETE WITH MICROSCOPIC (ARMC ONLY)
GLUCOSE, UA: NEGATIVE mg/dL
Hgb urine dipstick: NEGATIVE
Nitrite: NEGATIVE
PROTEIN: NEGATIVE mg/dL
SPECIFIC GRAVITY, URINE: 1.034 — AB (ref 1.005–1.030)
pH: 5 (ref 5.0–8.0)

## 2015-04-24 LAB — COMPREHENSIVE METABOLIC PANEL
ALBUMIN: 4.5 g/dL (ref 3.5–5.0)
ALK PHOS: 85 U/L (ref 38–126)
ALT: 17 U/L (ref 14–54)
ANION GAP: 5 (ref 5–15)
AST: 24 U/L (ref 15–41)
BILIRUBIN TOTAL: 0.4 mg/dL (ref 0.3–1.2)
BUN: 12 mg/dL (ref 6–20)
CALCIUM: 9.2 mg/dL (ref 8.9–10.3)
CO2: 27 mmol/L (ref 22–32)
CREATININE: 0.79 mg/dL (ref 0.44–1.00)
Chloride: 106 mmol/L (ref 101–111)
GFR calc non Af Amer: >60 mL/min (ref >60)
GLUCOSE: 70 mg/dL (ref 65–99)
Potassium: 3.8 mmol/L (ref 3.5–5.1)
Sodium: 138 mmol/L (ref 135–145)
TOTAL PROTEIN: 7.6 g/dL (ref 6.5–8.1)

## 2015-04-24 LAB — PREGNANCY, URINE: Preg Test, Ur: NEGATIVE

## 2015-04-24 MED ORDER — LEVOFLOXACIN IN D5W 750 MG/150ML IV SOLN
750.0000 mg | Freq: Once | INTRAVENOUS | Status: AC
  Administered 2015-04-24: 750 mg via INTRAVENOUS
  Filled 2015-04-24: qty 150

## 2015-04-24 MED ORDER — ONDANSETRON HCL 4 MG/2ML IJ SOLN
4.0000 mg | Freq: Once | INTRAMUSCULAR | Status: AC
  Administered 2015-04-24: 4 mg via INTRAVENOUS
  Filled 2015-04-24: qty 2

## 2015-04-24 MED ORDER — SODIUM CHLORIDE 0.9 % IV BOLUS (SEPSIS)
1000.0000 mL | Freq: Once | INTRAVENOUS | Status: AC
  Administered 2015-04-24: 1000 mL via INTRAVENOUS

## 2015-04-24 MED ORDER — SULFAMETHOXAZOLE-TRIMETHOPRIM 800-160 MG PO TABS: 1.0000 | ORAL_TABLET | Freq: Two times a day (BID) | ORAL | Status: DC

## 2015-04-24 MED ORDER — MORPHINE SULFATE (PF) 4 MG/ML IV SOLN
4.0000 mg | Freq: Once | INTRAVENOUS | Status: AC
  Administered 2015-04-24: 4 mg via INTRAVENOUS
  Filled 2015-04-24: qty 1

## 2015-04-24 NOTE — Discharge Instructions (Signed)
You were evaluated for lower abdominal discomfort and urinary symptoms, and your found have a persistent/continued evidence of urinary tract infection. You're can be treated for total of 8 additional days for urinary tract/kidney infection, pyelonephritis.  Return to the emergency room for any fever, new or worsening pain, inability to urinate, dizziness, passing out, or any other symptoms concerning to you.  Start your new antibiotic tomorrow.   Pyelonephritis, Adult Pyelonephritis is a kidney infection. The kidneys are the organs that filter a person's blood and move waste out of the bloodstream and into the urine. Urine passes from the kidneys, through the ureters, and into the bladder. There are two main types of pyelonephritis:  Infections that come on quickly without any warning (acute pyelonephritis).  Infections that last for a long period of time (chronic pyelonephritis). In most cases, the infection clears up with treatment and does not cause further problems. More severe infections or chronic infections can sometimes spread to the bloodstream or lead to other problems with the kidneys. CAUSES This condition is usually caused by:  Bacteria traveling from the bladder to the kidney through infected urine. The urine in the bladder can become infected with bacteria from:  Bladder infection (cystitis).  Inflammation of the prostate gland (prostatitis).  Sexual intercourse, in females.  Bacteria traveling from the bloodstream to the kidney. RISK FACTORS This condition is more likely to develop in:  Pregnant women.  Older people.  People who have diabetes.  People who have kidney stones or bladder stones.  People who have other abnormalities of the kidney or ureter.  People who have a catheter placed in the bladder.  People who have cancer.  People who are sexually active.  Women who use spermicides.  People who have had a prior urinary tract  infection. SYMPTOMS Symptoms of this condition include:  Frequent urination.  Strong or persistent urge to urinate.  Burning or stinging when urinating.  Abdominal pain.  Back pain.  Pain in the side or flank area.  Fever.  Chills.  Blood in the urine, or dark urine.  Nausea.  Vomiting. DIAGNOSIS This condition may be diagnosed based on:  Medical history and physical exam.  Urine tests.  Blood tests. You may also have imaging tests of the kidneys, such as an ultrasound or CT scan. TREATMENT Treatment for this condition may depend on the severity of the infection.  If the infection is mild and is found early, you may be treated with antibiotic medicines taken by mouth. You will need to drink fluids to remain hydrated.  If the infection is more severe, you may need to stay in the hospital and receive antibiotics given directly into a vein through an IV tube. You may also need to receive fluids through an IV tube if you are not able to remain hydrated. After your hospital stay, you may need to take oral antibiotics for a period of time. Other treatments may be required, depending on the cause of the infection. HOME CARE INSTRUCTIONS Medicines  Take over-the-counter and prescription medicines only as told by your health care provider.  If you were prescribed an antibiotic medicine, take it as told by your health care provider. Do not stop taking the antibiotic even if you start to feel better. General Instructions  Drink enough fluid to keep your urine clear or pale yellow.  Avoid caffeine, tea, and carbonated beverages. They tend to irritate the bladder.  Urinate often. Avoid holding in urine for long periods of time.  Urinate before and after sex.  After a bowel movement, women should cleanse from front to back. Use each tissue only once.  Keep all follow-up visits as told by your health care provider. This is important. SEEK MEDICAL CARE IF:  Your symptoms  do not get better after 2 days of treatment.  Your symptoms get worse.  You have a fever. SEEK IMMEDIATE MEDICAL CARE IF:  You are unable to take your antibiotics or fluids.  You have shaking chills.  You vomit.  You have severe flank or back pain.  You have extreme weakness or fainting.   This information is not intended to replace advice given to you by your health care provider. Make sure you discuss any questions you have with your health care provider.   Document Released: 07/02/2005 Document Revised: 03/23/2015 Document Reviewed: 10/25/2014 Elsevier Interactive Patient Education [employer]

## 2015-04-24 NOTE — ED Provider Notes (Signed)
[facility] Emergency Department Provider Note   ____________________________________________  Time seen: 11:30 AM I have reviewed the triage vital signs and the triage nursing note.  HISTORY  Chief Complaint Abdominal Pain   Historian Patient  HPI Molly Bray is a 19 y.o. female is here for evaluation of lower abdominal/pelvic cramping pain as well as left flank pain. She was seen on 04/19/15 and diagnosed with PID and possible urinary tract infection. She has been on doxycycline and metronidazole, however reports continued lower abdominal cramping pain which is at present 8 out of 10. She's not had any vaginal discharge nor has her period started. She is having urinary pressure when she attempts to urinate. She denies fevers. She did have nausea and threw up once today at work, and thinks this was probably a side effect of the medications that she has been taking.    Past Medical History  Diagnosis Date  . Pyelonephritis     There are no active problems to display for this patient.   Past Surgical History  Procedure Laterality Date  . Kidney surgery      Current Outpatient Rx  Name  Route  Sig  Dispense  Refill  . doxycycline (VIBRA-TABS) 100 MG tablet   Oral   Take 1 tablet (100 mg total) by mouth 2 (two) times daily.   20 tablet   0   . doxycycline (VIBRAMYCIN) 100 MG capsule   Oral   Take 1 capsule (100 mg total) by mouth 2 (two) times daily.   14 capsule   0   . metroNIDAZOLE (FLAGYL) 500 MG tablet   Oral   Take 1 tablet (500 mg total) by mouth 2 (two) times daily.   14 tablet   0   . oxyCODONE-acetaminophen (PERCOCET) 5-325 MG tablet   Oral   Take 2 tablets by mouth every 6 (six) hours as needed for moderate pain or severe pain.   30 tablet   0   . sulfamethoxazole-trimethoprim (BACTRIM DS,SEPTRA DS) 800-160 MG tablet   Oral   Take 1 tablet by mouth 2 (two) times daily.   14 tablet   0   . traMADol (ULTRAM) 50 MG  tablet   Oral   Take 1 tablet (50 mg total) by mouth every 6 (six) hours as needed.   20 tablet   0     Allergies Amoxicillin and Penicillins  No family history on file.  Social History Social History  Substance Use Topics  . Smoking status: Current Some Day Smoker  . Smokeless tobacco: Not on file  . Alcohol Use: No    Review of Systems  Constitutional: Negative for fever. Eyes: Negative for visual changes. ENT: Negative for sore throat. Cardiovascular: Negative for chest pain. Respiratory: Negative for shortness of breath. Gastrointestinal: Positive per history of present illness. Genitourinary: Positive for dysuria. Musculoskeletal: Negative for left flank pain. Skin: Negative for rash. Neurological: Negative for headache. 10 point Review of Systems otherwise negative ____________________________________________   PHYSICAL EXAM:  VITAL SIGNS: ED Triage Vitals  Enc Vitals Group     BP 04/24/15 1021 115/56 mmHg     Pulse Rate 04/24/15 1021 100     Resp 04/24/15 1021 16     Temp 04/24/15 1021 98.3 F (36.8 C)     Temp Source 04/24/15 1021 Oral     SpO2 04/24/15 1021 98 %     Weight 04/24/15 1021 170 lb (77.111 kg)     Height 04/24/15  1021  (1.676 m)     Head Cir --      Peak Flow --      Pain Score 04/24/15 1023 6     Pain Loc --      Pain Edu? --      Excl. in GC? --      Constitutional: Alert and oriented. Well appearing and in no distress. Eyes: Conjunctivae are normal. PERRL. Normal extraocular movements. ENT   Head: Normocephalic and atraumatic.   Nose: No congestion/rhinnorhea.   Mouth/Throat: Mucous membranes are moist.   Neck: No stridor. Cardiovascular/Chest: Normal rate, regular rhythm.  No murmurs, rubs, or gallops. Respiratory: Normal respiratory effort without tachypnea nor retractions. Breath sounds are clear and equal bilaterally. No wheezes/rales/rhonchi. Gastrointestinal: Soft. No distention, no guarding, no  rebound. Moderate tenderness suprapubic and lower pelvic area. No point tenderness over McBurney's point.  Genitourinary/rectal:Deferred Musculoskeletal: Nontender with normal range of motion in all extremities. No joint effusions.  No lower extremity tenderness.  No edema. Neurologic:  Normal speech and language. No gross or focal neurologic deficits are appreciated. Skin:  Skin is warm, dry and intact. No rash noted. Psychiatric: Mood and affect are normal. Speech and behavior are normal. Patient exhibits appropriate insight and judgment.  ____________________________________________   EKG I, Governor Rooks, MD, the attending physician have personally viewed and interpreted all ECGs.  No EKG performed ____________________________________________  LABS (pertinent positives/negatives)  Comprehensive metabolic panel without significant abnormality Urinalysis 1+ leukocytes, 6-30 white blood cells, rare bacteria, trace ketones White blood count 11.4 with left shift. Hemoglobin 15.2 and platelet count 54  ____________________________________________  RADIOLOGY All Xrays were viewed by me. Imaging interpreted by Radiologist.  None __________________________________________  PROCEDURES  Procedure(s) performed: None  Critical Care performed: None  ____________________________________________   ED COURSE / ASSESSMENT AND PLAN  CONSULTATIONS: None  Pertinent labs & imaging results that were available during my care of the patient were reviewed by me and considered in my medical decision making (see chart for details).  I reviewed patient's chart from several days ago, where she was given Cipro in the emergency department although it appears UTI/pyelonephritis considered much less likely given that skin contaminant. She was discharged on doxycycline and metronidazole and Percocet for pain associated with PID. Patient does not have insurance and does not have primary care physician  to follow up with.  No fever today, pulse is 100.  Patient's urinalysis is still consistent with a urinary tract infection. Her white blood cell count is still elevated, however it is improved from previous. Her renal function is normal. I rechecked her results from the gonorrhea and chlamydia test several days ago which were negative. I suspect how nephritis/urine tract infection may have been the source all along, and patient has not had adequate coverage for UTI. She was given IV dose of Levaquin here in the emergency department due to allergy to penicillins. Patient will be discharged with Bactrim.  Repeat abdominal exam shows no focal right lower quadrant tenderness or focal tenderness on the abdomen. I do not suspect appendicitis, tubo-ovarian abscess, or any other surgical/medical emergency that would necessitate CT imaging or ultrasound imaging at this point in time. I discussed this with the patient, she feels comfortable/confident about avoiding CT radiation at this point in time.   Patient / Family / Caregiver informed of clinical course, medical decision-making process, and agree with plan.   I discussed return precautions, follow-up instructions, and discharged instructions with patient and/or family.  ___________________________________________  FINAL CLINICAL IMPRESSION(S) / ED DIAGNOSES   Final diagnoses:  Pyelonephritis       Governor Rooks, MD 04/24/15 1430

## 2015-04-24 NOTE — ED Notes (Signed)
Pt states that she was seen here on 10/5 for same abd pain, and dx as kidney infection, PID. Has been taking prescribed abx as ordered, with no relief of pain or discomfort. Locates pain to RLQ and reports burning with urination. Returned today for further evaluation.

## 2015-04-24 NOTE — ED Notes (Signed)
Pt reports lower abd pain that radiates to back. Diagnosed with kidney infection and PID. Pt reports continued pain. Denies fever.

## 2015-05-28 ENCOUNTER — Emergency Department: Payer: Medicaid Other

## 2015-05-28 ENCOUNTER — Emergency Department
Admission: EM | Admit: 2015-05-28 | Discharge: 2015-05-28 | Disposition: A | Discharge status: Home / Self Care | Payer: Medicaid Other | Attending: Emergency Medicine | Admitting: Emergency Medicine

## 2015-05-28 DIAGNOSIS — K8020 Calculus of gallbladder without cholecystitis without obstruction: Secondary | ICD-10-CM | POA: Insufficient documentation

## 2015-05-28 DIAGNOSIS — Z880 Allergy status to penicillin: Secondary | ICD-10-CM | POA: Diagnosis not present

## 2015-05-28 DIAGNOSIS — Z79899 Other long term (current) drug therapy: Secondary | ICD-10-CM | POA: Diagnosis not present

## 2015-05-28 DIAGNOSIS — Z792 Long term (current) use of antibiotics: Secondary | ICD-10-CM | POA: Insufficient documentation

## 2015-05-28 DIAGNOSIS — Z720 Tobacco use: Secondary | ICD-10-CM | POA: Diagnosis not present

## 2015-05-28 DIAGNOSIS — K8050 Calculus of bile duct without cholangitis or cholecystitis without obstruction: Secondary | ICD-10-CM

## 2015-05-28 DIAGNOSIS — R109 Unspecified abdominal pain: Secondary | ICD-10-CM | POA: Diagnosis present

## 2015-05-28 LAB — URINALYSIS COMPLETE WITH MICROSCOPIC (ARMC ONLY)
BACTERIA UA: NONE SEEN
Bilirubin Urine: NEGATIVE
Glucose, UA: NEGATIVE mg/dL
Nitrite: NEGATIVE
PROTEIN: NEGATIVE mg/dL
Specific Gravity, Urine: 1.03 (ref 1.005–1.030)
pH: 5 (ref 5.0–8.0)

## 2015-05-28 LAB — COMPREHENSIVE METABOLIC PANEL
ALBUMIN: 4.6 g/dL (ref 3.5–5.0)
ALK PHOS: 92 U/L (ref 38–126)
ALT: 13 U/L — AB (ref 14–54)
ANION GAP: 5 (ref 5–15)
AST: 17 U/L (ref 15–41)
BUN: 13 mg/dL (ref 6–20)
CALCIUM: 9.4 mg/dL (ref 8.9–10.3)
CO2: 27 mmol/L (ref 22–32)
Chloride: 108 mmol/L (ref 101–111)
Creatinine, Ser: 0.72 mg/dL (ref 0.44–1.00)
GFR calc Af Amer: >60 mL/min (ref >60)
GFR calc non Af Amer: >60 mL/min (ref >60)
GLUCOSE: 105 mg/dL — AB (ref 65–99)
Potassium: 3.9 mmol/L (ref 3.5–5.1)
SODIUM: 140 mmol/L (ref 135–145)
Total Bilirubin: 0.6 mg/dL (ref 0.3–1.2)
Total Protein: 7.7 g/dL (ref 6.5–8.1)

## 2015-05-28 LAB — CBC
HCT: 43.1 % (ref 35.0–47.0)
HEMOGLOBIN: 14.5 g/dL (ref 12.0–16.0)
MCH: 30.5 pg (ref 26.0–34.0)
MCHC: 33.6 g/dL (ref 32.0–36.0)
MCV: 90.8 fL (ref 80.0–100.0)
Platelets: 260 10*3/uL (ref 150–440)
RBC: 4.75 MIL/uL (ref 3.80–5.20)
RDW: 13.6 % (ref 11.5–14.5)
WBC: 10.6 10*3/uL (ref 3.6–11.0)

## 2015-05-28 LAB — LIPASE, BLOOD: Lipase: 31 U/L (ref 11–51)

## 2015-05-28 MED ORDER — DIPHENHYDRAMINE HCL 50 MG/ML IJ SOLN
INTRAMUSCULAR | Status: AC
  Administered 2015-05-28: 25 mg via INTRAVENOUS
  Filled 2015-05-28: qty 1

## 2015-05-28 MED ORDER — ONDANSETRON HCL 4 MG/2ML IJ SOLN
4.0000 mg | Freq: Once | INTRAMUSCULAR | Status: AC
  Administered 2015-05-28: 4 mg via INTRAVENOUS
  Filled 2015-05-28: qty 2

## 2015-05-28 MED ORDER — DIPHENHYDRAMINE HCL 50 MG/ML IJ SOLN
25.0000 mg | Freq: Once | INTRAMUSCULAR | Status: AC
  Administered 2015-05-28: 25 mg via INTRAVENOUS

## 2015-05-28 MED ORDER — SODIUM CHLORIDE 0.9 % IV BOLUS (SEPSIS)
1000.0000 mL | Freq: Once | INTRAVENOUS | Status: AC
  Administered 2015-05-28: 1000 mL via INTRAVENOUS

## 2015-05-28 MED ORDER — OXYCODONE-ACETAMINOPHEN 5-325 MG PO TABS: 1.0000 | ORAL_TABLET | Freq: Four times a day (QID) | ORAL | Status: DC | PRN

## 2015-05-28 MED ORDER — MORPHINE SULFATE (PF) 4 MG/ML IV SOLN
4.0000 mg | Freq: Once | INTRAVENOUS | Status: AC
  Administered 2015-05-28: 4 mg via INTRAVENOUS
  Filled 2015-05-28: qty 1

## 2015-05-28 NOTE — ED Notes (Signed)
Discussed discharge instructions, prescriptions, and follow-up care with patient. No questions or concerns at this time. Pt stable at discharge.  

## 2015-05-28 NOTE — Discharge Instructions (Signed)
Please call the number provided as soon as possible to arrange a follow-up appointment with surgery to discuss your abdominal pain and cholelithiasis (gallbladder stones). Please take your pain medication as needed, as written. Return to the emergency department for any worsening pain, fever, or any other symptom personally concerning to your self.   Cholelithiasis Cholelithiasis (also called gallstones) is a form of gallbladder disease. The gallbladder is a small organ that helps you digest fats. Symptoms of gallstones are:  Feeling sick to your stomach (nausea).  Throwing up (vomiting).  Belly pain.  Yellowing of the skin (jaundice).  Sudden pain. You may feel the pain for minutes to hours.  Fever.  Pain to the touch. HOME CARE  Only take medicines as told by your doctor.  Eat a low-fat diet until you see your doctor again. Eating fat can result in pain.  Follow up with your doctor as told. Attacks usually happen time after time. Surgery is usually needed for permanent treatment. GET HELP RIGHT AWAY IF:   Your pain gets worse.  Your pain is not helped by medicines.  You have a fever and lasting symptoms for more than 2-3 days.  You have a fever and your symptoms suddenly get worse.  You keep feeling sick to your stomach and throwing up. MAKE SURE YOU:   Understand these instructions.  Will watch your condition.  Will get help right away if you are not doing well or get worse.   This information is not intended to replace advice given to you by your health care provider. Make sure you discuss any questions you have with your health care provider.   Document Released: 12/19/2007 Document Revised: 03/04/2013 Document Reviewed: 12/24/2012 Elsevier Interactive Patient Education [employer]

## 2015-05-28 NOTE — ED Provider Notes (Signed)
[facility] Emergency Department Provider Note  Time seen: 3:53 PM  I have reviewed the triage vital signs and the nursing notes.   HISTORY  Chief Complaint Abdominal Pain    HPI Molly Bray is a 19 y.o. female with no past medical history who presents to the emergency department with right sided abdominal pain. According to the patient for the past year she has had intermittent right-sided pain which is thought to be due to her gallbladder. Patient has had an ultrasound done in the past showing gallbladder stones, but has not followed up with anyone. States she ate dinner last 90 9 PM, and the pain started at 1 AM. Describes the pain as right-sided to right upper quadrant dull/aching pain. States nausea with one episode of vomiting last night, none today. Denies diarrhea, dysuria, vaginal discharge, the patient is currently on her period. Describes her abdominal pain as moderate, right-sided. Has not tried eating. Mild nausea. No modifying factor identified.     Past Medical History  Diagnosis Date  . Pyelonephritis     There are no active problems to display for this patient.   Past Surgical History  Procedure Laterality Date  . Kidney surgery      Current Outpatient Rx  Name  Route  Sig  Dispense  Refill  . doxycycline (VIBRA-TABS) 100 MG tablet   Oral   Take 1 tablet (100 mg total) by mouth 2 (two) times daily.   20 tablet   0   . doxycycline (VIBRAMYCIN) 100 MG capsule   Oral   Take 1 capsule (100 mg total) by mouth 2 (two) times daily.   14 capsule   0   . metroNIDAZOLE (FLAGYL) 500 MG tablet   Oral   Take 1 tablet (500 mg total) by mouth 2 (two) times daily.   14 tablet   0   . oxyCODONE-acetaminophen (PERCOCET) 5-325 MG tablet   Oral   Take 2 tablets by mouth every 6 (six) hours as needed for moderate pain or severe pain.   30 tablet   0   . sulfamethoxazole-trimethoprim (BACTRIM DS,SEPTRA DS) 800-160 MG tablet    Oral   Take 1 tablet by mouth 2 (two) times daily.   14 tablet   0   . traMADol (ULTRAM) 50 MG tablet   Oral   Take 1 tablet (50 mg total) by mouth every 6 (six) hours as needed.   20 tablet   0     Allergies Amoxicillin and Penicillins  No family history on file.  Social History Social History  Substance Use Topics  . Smoking status: Current Some Day Smoker  . Smokeless tobacco: None  . Alcohol Use: No    Review of Systems Constitutional: Negative for fever. Cardiovascular: Negative for chest pain. Respiratory: Negative for shortness of breath. Gastrointestinal: Positive for right-sided abdominal pain. Positive for nausea and vomiting. Negative for diarrhea or constipation. Genitourinary: Negative for dysuria. Negative for vaginal discharge. Musculoskeletal: Negative for back pain. Neurological: Negative for headache 10-point ROS otherwise negative.  ____________________________________________   PHYSICAL EXAM:  VITAL SIGNS: ED Triage Vitals  Enc Vitals Group     BP 05/28/15 1425 121/71 mmHg     Pulse Rate 05/28/15 1425 106     Resp 05/28/15 1425 18     Temp 05/28/15 1425 98.1 F (36.7 C)     Temp Source 05/28/15 1425 Oral     SpO2 05/28/15 1425 98 %     Weight  05/28/15 1425 170 lb (77.111 kg)     Height 05/28/15 1425 5\' 6"  (1.676 m)     Head Cir --      Peak Flow --      Pain Score 05/28/15 1426 8     Pain Loc --      Pain Edu? --      Excl. in GC? --     Constitutional: Alert and oriented. Well appearing and in no distress. Eyes: Normal exam ENT   Head: Normocephalic and atraumatic   Mouth/Throat: Mucous membranes are moist. Cardiovascular: Normal rate, regular rhythm. No murmur Respiratory: Normal respiratory effort without tachypnea nor retractions. Breath sounds are clear and equal bilaterally. No wheezes/rales/rhonchi. Gastrointestinal: Soft, mild right upper quadrant tenderness palpation. No rebound or guarding. No  distention. Musculoskeletal: Nontender with normal range of motion in all extremities.  Neurologic:  Normal speech and language. No gross focal neurologic deficits Skin:  Skin is warm, dry and intact.  Psychiatric: Mood and affect are normal. Speech and behavior are normal.   ____________________________________________     RADIOLOGY  Ultrasound shows single 1 cm stone, no signs of cholecystitis.  ____________________________________________   INITIAL IMPRESSION / ASSESSMENT AND PLAN / ED COURSE  Pertinent labs & imaging results that were available during my care of the patient were reviewed by me and considered in my medical decision making (see chart for details).  Patient with right upper quadrant pain, history of known gallstones. We will repeat an ultrasound today to evaluate for cholecystitis. Minimal tenderness on exam. Normal labs. We will treat pain and nausea, IV hydrate, and obtain an ultrasound help further evaluate. Suspect likely biliary colic, as long as the ultrasound appears well we'll likely recommend outpatient follow-up.  Ultrasound shows gallbladder stone 1 cm in size, but no signs of cholecystitis. I discussed with the patient the need to follow up with general surgery for an elective cholecystectomy. The patient is agreeable. We'll discharge her pain medication. Patient is to call surgery Monday morning.  ____________________________________________   FINAL CLINICAL IMPRESSION(S) / ED DIAGNOSES  Biliary colic   Minna AntisKevin Jennfer Gassen, MD 05/28/15 1737

## 2015-05-28 NOTE — ED Notes (Signed)
Pt noted to have small pink rash to RFA where IV tubing was secured to skin. MD notified, orders received for 25mg  Benadryl IV and then remove IV.

## 2015-05-28 NOTE — ED Notes (Signed)
Pt c/o RUQ pain since last night with nausea, known hx of gall bladder issues,  Pt has ultrasound picture with her.. Denies vomiting or diarrhea

## 2015-05-28 NOTE — ED Notes (Signed)
Rash no longer noted to RFA. Pt has no c/o difficulty breathing or itching.

## 2015-10-01 ENCOUNTER — Emergency Department: Payer: Medicaid Other

## 2015-10-01 ENCOUNTER — Encounter: Payer: Self-pay | Admitting: Emergency Medicine

## 2015-10-01 ENCOUNTER — Emergency Department
Admission: EM | Admit: 2015-10-01 | Discharge: 2015-10-01 | Disposition: A | Discharge status: Home / Self Care | Payer: Medicaid Other | Attending: Emergency Medicine | Admitting: Emergency Medicine

## 2015-10-01 DIAGNOSIS — R1032 Left lower quadrant pain: Secondary | ICD-10-CM

## 2015-10-01 DIAGNOSIS — Z792 Long term (current) use of antibiotics: Secondary | ICD-10-CM | POA: Insufficient documentation

## 2015-10-01 DIAGNOSIS — R102 Pelvic and perineal pain: Secondary | ICD-10-CM

## 2015-10-01 DIAGNOSIS — N858 Other specified noninflammatory disorders of uterus: Secondary | ICD-10-CM | POA: Diagnosis not present

## 2015-10-01 DIAGNOSIS — N83202 Unspecified ovarian cyst, left side: Secondary | ICD-10-CM | POA: Diagnosis not present

## 2015-10-01 DIAGNOSIS — F17200 Nicotine dependence, unspecified, uncomplicated: Secondary | ICD-10-CM | POA: Diagnosis not present

## 2015-10-01 DIAGNOSIS — N644 Mastodynia: Secondary | ICD-10-CM | POA: Insufficient documentation

## 2015-10-01 DIAGNOSIS — Z3202 Encounter for pregnancy test, result negative: Secondary | ICD-10-CM | POA: Diagnosis not present

## 2015-10-01 DIAGNOSIS — Z880 Allergy status to penicillin: Secondary | ICD-10-CM | POA: Insufficient documentation

## 2015-10-01 LAB — URINALYSIS COMPLETE WITH MICROSCOPIC (ARMC ONLY)
Bilirubin Urine: NEGATIVE
GLUCOSE, UA: NEGATIVE mg/dL
HGB URINE DIPSTICK: NEGATIVE
KETONES UR: NEGATIVE mg/dL
NITRITE: NEGATIVE
Protein, ur: NEGATIVE mg/dL
RBC / HPF: NONE SEEN RBC/hpf (ref 0–5)
SPECIFIC GRAVITY, URINE: 1.025 (ref 1.005–1.030)
pH: 6 (ref 5.0–8.0)

## 2015-10-01 LAB — WET PREP, GENITAL
CLUE CELLS WET PREP: NONE SEEN
Sperm: NONE SEEN
Trich, Wet Prep: NONE SEEN
Yeast Wet Prep HPF POC: NONE SEEN

## 2015-10-01 LAB — CHLAMYDIA/NGC RT PCR (ARMC ONLY)
CHLAMYDIA TR: NOT DETECTED
N GONORRHOEAE: NOT DETECTED

## 2015-10-01 LAB — POCT PREGNANCY, URINE: Preg Test, Ur: NEGATIVE

## 2015-10-01 NOTE — ED Notes (Signed)
MD and this RN at bedside for pelvic exam. Patient tolerated well.

## 2015-10-01 NOTE — ED Notes (Signed)
Pt reports last month period was only 1 day.  Has not had period this month.  One positive UPT at home and 3 negative but feels like last time she was pregnant.  Has had some lower abdominal cramping.

## 2015-10-01 NOTE — ED Notes (Signed)
Patient transported to US 

## 2015-10-01 NOTE — Discharge Instructions (Signed)

## 2015-10-01 NOTE — ED Notes (Signed)
Patient is currently being treated for a UTI, taking cipro. Patient c/o lower abd pain. Pain has been on and off for the last 8 days. Patient states this is the way she felt when she was pregnant. Patient states last time she was pregnant it only showed up during an US and not on a blood or urine test. Denies N/V/D.

## 2015-10-01 NOTE — ED Notes (Signed)
MD at bedside to followup 

## 2015-10-01 NOTE — ED Provider Notes (Signed)
[facility] Emergency Department Provider Note  ____________________________________________  Time seen: Approximately 5:20 PM  I have reviewed the triage vital signs and the nursing notes.   HISTORY  Chief Complaint possible pregnancy     HPI Molly Bray is a 20 y.o. female who is currently taking Cipro for UTI is presenting with cramping lower abdominal pain which she describes as needed out of 10 at this time. She denies any radiation of the pain. Think she is pregnant. York Spaniel that she missed her period this month and only had a light period last month. Said that she has taken multiple pregnancy tests at home with one positive with a "faint line" and 3 negative tests. However, she says the last time she was pregnant she says that her pregnancy test did not, positive until she was 11 weeks. She says she also has nausea as well as aching in her breasts. She denies any burning or frequency with urination. However, she says that her urine smelled bad and this is how she knew she had a urinary tract infection. She says she has a history of pelvic inflammatory disease but is only sexually active with her husband at this time and she says that they were both tested and were negative for STDs prior to their marriage. Says that she does have a baseline vaginal discharge which is unchanged. Denies any vaginal bleeding.   Past Medical History  Diagnosis Date  . Pyelonephritis     There are no active problems to display for this patient.   Past Surgical History  Procedure Laterality Date  . Kidney surgery      Current Outpatient Rx  Name  Route  Sig  Dispense  Refill  . doxycycline (VIBRA-TABS) 100 MG tablet   Oral   Take 1 tablet (100 mg total) by mouth 2 (two) times daily.   20 tablet   0   . doxycycline (VIBRAMYCIN) 100 MG capsule   Oral   Take 1 capsule (100 mg total) by mouth 2 (two) times daily.   14 capsule   0   . metroNIDAZOLE (FLAGYL) 500  MG tablet   Oral   Take 1 tablet (500 mg total) by mouth 2 (two) times daily.   14 tablet   0   . oxyCODONE-acetaminophen (ROXICET) 5-325 MG tablet   Oral   Take 1 tablet by mouth every 6 (six) hours as needed.   20 tablet   0   . sulfamethoxazole-trimethoprim (BACTRIM DS,SEPTRA DS) 800-160 MG tablet   Oral   Take 1 tablet by mouth 2 (two) times daily.   14 tablet   0   . traMADol (ULTRAM) 50 MG tablet   Oral   Take 1 tablet (50 mg total) by mouth every 6 (six) hours as needed.   20 tablet   0     Allergies Amoxicillin and Penicillins  History reviewed. No pertinent family history.  Social History Social History  Substance Use Topics  . Smoking status: Current Some Day Smoker  . Smokeless tobacco: None  . Alcohol Use: No    Review of Systems Constitutional: No fever/chills Eyes: No visual changes. ENT: No sore throat. Cardiovascular: Denies chest pain. Respiratory: Denies shortness of breath. Gastrointestinal:  no vomiting.  No diarrhea.  No constipation. Genitourinary: Negative for dysuria. Musculoskeletal: Negative for back pain. Skin: Negative for rash. Neurological: Negative for headaches, focal weakness or numbness.  10-point ROS otherwise negative.  ____________________________________________   PHYSICAL EXAM:  VITAL  SIGNS: ED Triage Vitals  Enc Vitals Group     BP 10/01/15 1704 110/70 mmHg     Pulse Rate 10/01/15 1704 100     Resp 10/01/15 1704 16     Temp 10/01/15 1704 98.7 F (37.1 C)     Temp Source 10/01/15 1704 Oral     SpO2 10/01/15 1704 98 %     Weight 10/01/15 1704 170 lb (77.111 kg)     Height 10/01/15 1704 5\' 3"  (1.6 m)     Head Cir --      Peak Flow --      Pain Score 10/01/15 1702 8     Pain Loc --      Pain Edu? --      Excl. in GC? --     Constitutional: Alert and oriented. Well appearing and in no acute distress. Eyes: Conjunctivae are normal. PERRL. EOMI. Head: Atraumatic. Nose: No  congestion/rhinnorhea. Mouth/Throat: Mucous membranes are moist.   Neck: No stridor.   Cardiovascular: Normal rate, regular rhythm. Grossly normal heart sounds.  Good peripheral circulation. Respiratory: Normal respiratory effort.  No retractions. Lungs CTAB. Gastrointestinal: Soft With mild suprapubic as well as left lower quadrant tenderness. There is no rebound or guarding. No distention.  No CVA tenderness. Genitourinary: Normal external exam. Speculum exam with a small amount of white discharge. Bimanual exam without any cervical motion tenderness. No masses but with mild to moderate left adnexal tenderness to palpation. Musculoskeletal: No lower extremity tenderness nor edema.  No joint effusions. Neurologic:  Normal speech and language. No gross focal neurologic deficits are appreciated. No gait instability. Skin:  Skin is warm, dry and intact. No rash noted. Psychiatric: Mood and affect are normal. Speech and behavior are normal.  ____________________________________________   LABS (all labs ordered are listed, but only abnormal results are displayed)  Labs Reviewed  WET PREP, GENITAL - Abnormal; Notable for the following:    WBC, Wet Prep HPF POC TOO NUMEROUS TO COUNT (*)    All other components within normal limits  URINALYSIS COMPLETEWITH MICROSCOPIC ([facility] ONLY) - Abnormal; Notable for the following:    Color, Urine YELLOW (*)    APPearance HAZY (*)    Leukocytes, UA 2+ (*)    Bacteria, UA RARE (*)    Squamous Epithelial / LPF 6-30 (*)    All other components within normal limits  CHLAMYDIA/NGC RT PCR ([facility] ONLY)  POC URINE PREG, ED  POCT PREGNANCY, URINE   ____________________________________________  EKG   ____________________________________________  RADIOLOGY  IMPRESSION: 1. Normal appearance of the uterus and right ovary. 2. Complex left ovarian mass favored to represent cyst with retracted clot. Given the patient's symptoms of pain and  the appearance, followup ultrasound is recommended in 8-12 weeks to document resolution.   Electronically Signed By: Norva PavlovElizabeth Brown M.D. On: 10/01/2015 18:42       ____________________________________________   PROCEDURES    ____________________________________________   INITIAL IMPRESSION / ASSESSMENT AND PLAN / ED COURSE  Pertinent labs & imaging results that were available during my care of the patient were reviewed by me and considered in my medical decision making (see chart for details).  ----------------------------------------- 7:35 PM on 10/01/2015 -----------------------------------------  Reviewed the labs as well as imaging results with the patient. She is aware of the left ovarian mass. Likely the cause of her pain. She says that she is no longer having any foul odor to her urine. She is not concerned for STDs at this time and appears  to have a chronically elevated white blood cell count on her vaginal swabs. She does not want STD treatment or further UTI treatment this point. I will be sending a culture of her urine to see if it is susceptible to Cipro which she is currently taking at this time. She was given a copy of her Report Take with Her for Follow-Up with OB/GYN. She Knows That She Will Need a Repeat Ultrasound at 12 Weeks. ____________________________________________   FINAL CLINICAL IMPRESSION(S) / ED DIAGNOSES  Final diagnoses:  Left adnexal tenderness  Left-sided ovarian cyst. Pelvic pain.    Myrna Blazer, MD 10/01/15 [phone]

## 2015-10-05 LAB — URINE CULTURE

## 2015-11-21 DIAGNOSIS — F17200 Nicotine dependence, unspecified, uncomplicated: Secondary | ICD-10-CM | POA: Insufficient documentation

## 2015-11-28 ENCOUNTER — Encounter: Payer: Self-pay | Admitting: *Deleted

## 2015-11-30 ENCOUNTER — Encounter: Payer: Self-pay | Admitting: Emergency Medicine

## 2015-11-30 ENCOUNTER — Emergency Department
Admission: EM | Admit: 2015-11-30 | Discharge: 2015-11-30 | Disposition: A | Discharge status: Home / Self Care | Payer: Medicaid Other | Attending: Emergency Medicine | Admitting: Emergency Medicine

## 2015-11-30 DIAGNOSIS — R220 Localized swelling, mass and lump, head: Secondary | ICD-10-CM | POA: Diagnosis present

## 2015-11-30 DIAGNOSIS — Z87891 Personal history of nicotine dependence: Secondary | ICD-10-CM | POA: Diagnosis not present

## 2015-11-30 DIAGNOSIS — B9689 Other specified bacterial agents as the cause of diseases classified elsewhere: Secondary | ICD-10-CM

## 2015-11-30 DIAGNOSIS — T783XXA Angioneurotic edema, initial encounter: Secondary | ICD-10-CM

## 2015-11-30 DIAGNOSIS — L089 Local infection of the skin and subcutaneous tissue, unspecified: Secondary | ICD-10-CM | POA: Diagnosis not present

## 2015-11-30 LAB — CBC WITH DIFFERENTIAL/PLATELET
BASOS ABS: 0.1 10*3/uL (ref 0–0.1)
Basophils Relative: 1 %
Eosinophils Absolute: 0.5 10*3/uL (ref 0–0.7)
HCT: 42.7 % (ref 35.0–47.0)
Hemoglobin: 14.3 g/dL (ref 12.0–16.0)
Lymphs Abs: 2.1 10*3/uL (ref 1.0–3.6)
MCH: 29.9 pg (ref 26.0–34.0)
MCHC: 33.6 g/dL (ref 32.0–36.0)
MCV: 89.2 fL (ref 80.0–100.0)
MONO ABS: 0.7 10*3/uL (ref 0.2–0.9)
Neutro Abs: 6 10*3/uL (ref 1.4–6.5)
Neutrophils Relative %: 64 %
PLATELETS: 248 10*3/uL (ref 150–440)
RBC: 4.78 MIL/uL (ref 3.80–5.20)
RDW: 13.5 % (ref 11.5–14.5)
WBC: 9.3 10*3/uL (ref 3.6–11.0)

## 2015-11-30 MED ORDER — PREDNISONE 20 MG PO TABS: ORAL_TABLET | ORAL | Status: DC

## 2015-11-30 MED ORDER — SULFAMETHOXAZOLE-TRIMETHOPRIM 800-160 MG PO TABS: 1.0000 | ORAL_TABLET | Freq: Two times a day (BID) | ORAL | Status: DC

## 2015-11-30 MED ORDER — METHYLPREDNISOLONE SODIUM SUCC 125 MG IJ SOLR
80.0000 mg | Freq: Once | INTRAMUSCULAR | Status: AC
  Administered 2015-11-30: 80 mg via INTRAMUSCULAR
  Filled 2015-11-30: qty 2

## 2015-11-30 NOTE — ED Notes (Signed)
Swelling to left facial and orbital region with appearance of a possible pimple to left temporal region but no head to it ,

## 2015-11-30 NOTE — ED Notes (Signed)
AAOx3.  Skin warm and dry.  NAD.  Facial swelling improved.

## 2015-11-30 NOTE — Discharge Instructions (Signed)
Angioedema  Angioedema is sudden puffiness (swelling), often of the skin. It can happen:  · On your face or privates (genitals).  · In your belly (abdomen) or other body parts.  It usually happens quickly and gets better in 1 or 2 days. It often starts at night and is found when you wake up. You may get red, itchy patches of skin (hives). Attacks can be dangerous if your breathing passages get puffy.  The condition may happen only once, or it can come back at random times. It may happen for several years before it goes away for good.  HOME CARE  · Only take medicines as told by your doctor.  · Always carry your emergency allergy medicines with you.  · Wear a medical bracelet as told by your doctor.  · Avoid things that you know will cause attacks (triggers).  GET HELP IF:  · You have another attack.  · Your attacks happen more often or get worse.  · The condition was passed to you by your parents and you want to have children.  GET HELP RIGHT AWAY IF:   · Your mouth, tongue, or lips are very puffy.  · You have trouble breathing.  · You have trouble swallowing.  · You pass out (faint).  MAKE SURE YOU:   · Understand these instructions.  · Will watch your condition.  · Will get help right away if you are not doing well or get worse.     This information is not intended to replace advice given to you by your health care provider. Make sure you discuss any questions you have with your health care provider.     Document Released: 06/20/2009 Document Revised: 04/22/2013 Document Reviewed: 02/23/2013  Elsevier Interactive Patient Education ©2016 [employer]

## 2015-11-30 NOTE — ED Provider Notes (Signed)
[facility] Emergency Department Provider Note  ____________________________________________  Time seen: Approximately 12:17 PM  I have reviewed the triage vital signs and the nursing notes.   HISTORY  Chief Complaint Facial Swelling    HPI Molly Bray is a 20 y.o. female , NAD, presents emergency department with 2 day history of swelling about her left eye and cheek. Face she had a pimple (about the left cheek that she's been waiting to raise to a pustule. During that time she's noted increasing swelling about her left eye and cheek. Woke this morning with the eye swollen shut. Has improved through the day after taking Benadryl. Has not noted any drainage nor redness. Has not had any runny nose, congestion, ear pain, sore throat. No injury or trauma to the eye or face. Denies fever, chills, body aches, abdominal pain, nausea, vomiting. No changes in medications, lotions, soaps, detergents nor any new foods or beverages. Denies any swelling about the lips, tongue, throat. No difficulty swallowing or breathing.   Past Medical History  Diagnosis Date  . Pyelonephritis   . Recurrent UTI     There are no active problems to display for this patient.   Past Surgical History  Procedure Laterality Date  . Kidney surgery      Current Outpatient Rx  Name  Route  Sig  Dispense  Refill  . doxycycline (VIBRA-TABS) 100 MG tablet   Oral   Take 1 tablet (100 mg total) by mouth 2 (two) times daily.   20 tablet   0   . doxycycline (VIBRAMYCIN) 100 MG capsule   Oral   Take 1 capsule (100 mg total) by mouth 2 (two) times daily.   14 capsule   0   . metroNIDAZOLE (FLAGYL) 500 MG tablet   Oral   Take 1 tablet (500 mg total) by mouth 2 (two) times daily.   14 tablet   0   . oxyCODONE-acetaminophen (ROXICET) 5-325 MG tablet   Oral   Take 1 tablet by mouth every 6 (six) hours as needed.   20 tablet   0   . predniSONE (DELTASONE) 20 MG tablet       Take 2 tablets by mouth, once daily, for 5 days   10 tablet   0   . sulfamethoxazole-trimethoprim (BACTRIM DS,SEPTRA DS) 800-160 MG tablet   Oral   Take 1 tablet by mouth 2 (two) times daily.   14 tablet   0   . traMADol (ULTRAM) 50 MG tablet   Oral   Take 1 tablet (50 mg total) by mouth every 6 (six) hours as needed.   20 tablet   0     Allergies Amoxicillin and Penicillins  No family history on file.  Social History Social History  Substance Use Topics  . Smoking status: Former Games developer  . Smokeless tobacco: None  . Alcohol Use: No     Review of Systems  Constitutional: No fever/chills, Fatigue Eyes: Swelling about right eyelids. No visual changes. No discharge ENT: No sore throat, Nasal congestion, runny nose, sneezing, ear pain. No swelling about the lips, tongue, throat. Cardiovascular: No chest pain. Respiratory: No cough. No shortness of breath. No wheezing.  Gastrointestinal: No abdominal pain.  No nausea, vomiting.   Musculoskeletal: Negative for neck pain.  Skin: Positive skin sore left cheek. Negative for rash. Neurological: Negative for headaches, focal weakness or numbness. 10-point ROS otherwise negative.  ____________________________________________   PHYSICAL EXAM:  VITAL SIGNS: ED Triage Vitals  Enc Vitals Group     BP 11/30/15 1156 114/64 mmHg     Pulse Rate 11/30/15 1156 105     Resp 11/30/15 1156 20     Temp 11/30/15 1156 98.3 F (36.8 C)     Temp Source 11/30/15 1156 Oral     SpO2 11/30/15 1156 98 %     Weight 11/30/15 1156 163 lb (73.936 kg)     Height 11/30/15 1156 5\' 3"  (1.6 m)     Head Cir --      Peak Flow --      Pain Score 11/30/15 1202 8     Pain Loc --      Pain Edu? --      Excl. in GC? --      Constitutional: Alert and oriented. Well appearing and in no acute distress. Eyes: Edema noted below right eye about the upper cheek line. No redness or warmth to the area. Conjunctivae are normal. PERRLA. EOMI without pain.   Head: Atraumatic. ENT:      Ears: Right TM visualized with mild effusion but no bulging, erythema, perforation. Left TM visualized without erythema, effusion, bulging, perforation.      Nose: No congestion/rhinnorhea.      Mouth/Throat: Mucous membranes are moist. Pharynx without erythema, swelling, exudate. Neck: Supple with full range of motion Hematological/Lymphatic/Immunilogical: No cervical lymphadenopathy. Cardiovascular: Normal rate, regular rhythm. Normal S1 and S2.  Good peripheral circulation. Respiratory: Normal respiratory effort without tachypnea or retractions. Lungs CTAB with breath sounds noted in all lung fields Neurologic:  Normal speech and language. No gross focal neurologic deficits are appreciated.  Skin:  Small, flat pimple noted about the left cheek distal to the edematous area. Skin is warm, dry and intact. No rash noted. Psychiatric: Mood and affect are normal. Speech and behavior are normal. Patient exhibits appropriate insight and judgement.   ____________________________________________   LABS (all labs ordered are listed, but only abnormal results are displayed)  Labs Reviewed  CBC WITH DIFFERENTIAL/PLATELET   ____________________________________________  EKG  None ____________________________________________  RADIOLOGY  None ____________________________________________    PROCEDURES  Procedure(s) performed: None    Medications  methylPREDNISolone sodium succinate (SOLU-MEDROL) 125 mg/2 mL injection 80 mg (80 mg Intramuscular Given 11/30/15 1240)     ____________________________________________   INITIAL IMPRESSION / ASSESSMENT AND PLAN / ED COURSE  Pertinent lab results that were available during my care of the patient were reviewed by me and considered in my medical decision making (see chart for details).  Patient's diagnosis is consistent with allergic angioedema with localized bacterial skin infection. Patient will be  discharged home with prescriptions for prednisone and Bactrim DS to take as instructed. Patient is to follow up with her primary care provider in 48 hours for recheck. Patient is given ED precautions to return to the ED for any worsening or new symptoms.      ____________________________________________  FINAL CLINICAL IMPRESSION(S) / ED DIAGNOSES  Final diagnoses:  Allergic angioedema, initial encounter  Localized bacterial skin infection      NEW MEDICATIONS STARTED DURING THIS VISIT:  New Prescriptions   PREDNISONE (DELTASONE) 20 MG TABLET    Take 2 tablets by mouth, once daily, for 5 days   SULFAMETHOXAZOLE-TRIMETHOPRIM (BACTRIM DS,SEPTRA DS) 800-160 MG TABLET    Take 1 tablet by mouth 2 (two) times daily.         Hope PigeonJami L Alajiah Dutkiewicz, PA-C 11/30/15 1326  Myrna Blazeravid Matthew Schaevitz, MD 11/30/15 1455

## 2015-12-06 ENCOUNTER — Ambulatory Visit: Payer: Self-pay | Admitting: Urology

## 2015-12-20 ENCOUNTER — Ambulatory Visit: Payer: Self-pay | Admitting: Urology

## 2015-12-20 ENCOUNTER — Encounter: Payer: Self-pay | Admitting: Urology

## 2016-03-15 ENCOUNTER — Emergency Department
Admission: EM | Admit: 2016-03-15 | Discharge: 2016-03-16 | Disposition: A | Discharge status: Home / Self Care | Payer: Medicaid Other | Attending: Emergency Medicine | Admitting: Emergency Medicine

## 2016-03-15 ENCOUNTER — Encounter: Payer: Self-pay | Admitting: *Deleted

## 2016-03-15 DIAGNOSIS — Z87891 Personal history of nicotine dependence: Secondary | ICD-10-CM | POA: Diagnosis not present

## 2016-03-15 DIAGNOSIS — Y9389 Activity, other specified: Secondary | ICD-10-CM | POA: Diagnosis not present

## 2016-03-15 DIAGNOSIS — S0990XA Unspecified injury of head, initial encounter: Secondary | ICD-10-CM | POA: Diagnosis present

## 2016-03-15 DIAGNOSIS — Z5181 Encounter for therapeutic drug level monitoring: Secondary | ICD-10-CM | POA: Diagnosis not present

## 2016-03-15 DIAGNOSIS — R10814 Left lower quadrant abdominal tenderness: Secondary | ICD-10-CM | POA: Insufficient documentation

## 2016-03-15 DIAGNOSIS — F10120 Alcohol abuse with intoxication, uncomplicated: Secondary | ICD-10-CM | POA: Diagnosis not present

## 2016-03-15 DIAGNOSIS — Y999 Unspecified external cause status: Secondary | ICD-10-CM | POA: Insufficient documentation

## 2016-03-15 DIAGNOSIS — S0101XA Laceration without foreign body of scalp, initial encounter: Secondary | ICD-10-CM

## 2016-03-15 DIAGNOSIS — F10920 Alcohol use, unspecified with intoxication, uncomplicated: Secondary | ICD-10-CM

## 2016-03-15 DIAGNOSIS — Y9289 Other specified places as the place of occurrence of the external cause: Secondary | ICD-10-CM | POA: Diagnosis not present

## 2016-03-15 LAB — CBC
HCT: 48.1 % — ABNORMAL HIGH (ref 35.0–47.0)
Hemoglobin: 16.6 g/dL — ABNORMAL HIGH (ref 12.0–16.0)
MCH: 31.8 pg (ref 26.0–34.0)
MCHC: 34.5 g/dL (ref 32.0–36.0)
MCV: 92.1 fL (ref 80.0–100.0)
PLATELETS: 272 10*3/uL (ref 150–440)
RBC: 5.22 MIL/uL — AB (ref 3.80–5.20)
RDW: 14.1 % (ref 11.5–14.5)
WBC: 12.7 10*3/uL — AB (ref 3.6–11.0)
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
## 2016-03-15 NOTE — ED Notes (Signed)
Pt pulled iv out with dr brown in room.  Pt states she doesn't want anything done until her mother gets here.  Pt vomiting. siderails up x 2

## 2016-03-16 ENCOUNTER — Encounter: Payer: Self-pay | Admitting: Radiology

## 2016-03-16 ENCOUNTER — Emergency Department: Payer: Medicaid Other

## 2016-03-16 LAB — COMPREHENSIVE METABOLIC PANEL
ALBUMIN: 4.5 g/dL (ref 3.5–5.0)
ALT: 25 U/L (ref 14–54)
AST: 28 U/L (ref 15–41)
Alkaline Phosphatase: 82 U/L (ref 38–126)
Anion gap: 9 (ref 5–15)
BUN: 11 mg/dL (ref 6–20)
CHLORIDE: 107 mmol/L (ref 101–111)
CO2: 25 mmol/L (ref 22–32)
CREATININE: 0.73 mg/dL (ref 0.44–1.00)
Calcium: 9.3 mg/dL (ref 8.9–10.3)
GFR calc Af Amer: >60 mL/min (ref >60)
GLUCOSE: 85 mg/dL (ref 65–99)
POTASSIUM: 3.4 mmol/L — AB (ref 3.5–5.1)
Sodium: 141 mmol/L (ref 135–145)
Total Bilirubin: 0.2 mg/dL — ABNORMAL LOW (ref 0.3–1.2)
Total Protein: 8.1 g/dL (ref 6.5–8.1)

## 2016-03-16 LAB — URINE DRUG SCREEN, QUALITATIVE (ARMC ONLY)
AMPHETAMINES, UR SCREEN: NOT DETECTED
Barbiturates, Ur Screen: NOT DETECTED
Benzodiazepine, Ur Scrn: NOT DETECTED
CANNABINOID 50 NG, UR ~~LOC~~: NOT DETECTED
COCAINE METABOLITE, UR ~~LOC~~: NOT DETECTED
MDMA (ECSTASY) UR SCREEN: NOT DETECTED
Methadone Scn, Ur: NOT DETECTED
OPIATE, UR SCREEN: NOT DETECTED
PHENCYCLIDINE (PCP) UR S: NOT DETECTED
Tricyclic, Ur Screen: NOT DETECTED

## 2016-03-16 LAB — ETHANOL: Alcohol, Ethyl (B): 164 mg/dL — ABNORMAL HIGH (ref <5)

## 2016-03-16 MED ORDER — PENTAFLUOROPROP-TETRAFLUOROETH EX AERO
INHALATION_SPRAY | CUTANEOUS | Status: DC | PRN
  Administered 2016-03-16: 04:00:00 via TOPICAL

## 2016-03-16 MED ORDER — ONDANSETRON HCL 4 MG/2ML IJ SOLN
4.0000 mg | Freq: Once | INTRAMUSCULAR | Status: AC
  Administered 2016-03-16: 4 mg via INTRAVENOUS

## 2016-03-16 MED ORDER — LIDOCAINE HCL (PF) 1 % IJ SOLN
INTRAMUSCULAR | Status: AC
  Administered 2016-03-16: 04:00:00
  Filled 2016-03-16: qty 5

## 2016-03-16 MED ORDER — KETOROLAC TROMETHAMINE 30 MG/ML IJ SOLN
30.0000 mg | Freq: Once | INTRAMUSCULAR | Status: AC
  Administered 2016-03-16: 30 mg via INTRAVENOUS
  Filled 2016-03-16: qty 1

## 2016-03-16 MED ORDER — ONDANSETRON HCL 4 MG/2ML IJ SOLN
INTRAMUSCULAR | Status: AC
  Administered 2016-03-16: 4 mg via INTRAVENOUS
  Filled 2016-03-16: qty 2

## 2016-03-16 MED ORDER — PENTAFLUOROPROP-TETRAFLUOROETH EX AERO
INHALATION_SPRAY | CUTANEOUS | Status: AC
  Filled 2016-03-16: qty 30

## 2016-03-16 MED ORDER — IOPAMIDOL (ISOVUE-300) INJECTION 61%
100.0000 mL | Freq: Once | INTRAVENOUS | Status: AC | PRN
  Administered 2016-03-16: 100 mL via INTRAVENOUS

## 2016-03-16 NOTE — ED Notes (Signed)
Patient pending discharge. A+OX4, Ambulation without difficulty. Gait noted as straight and steady. Patient requested phone to contact family for a ride home.
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
## 2016-03-16 NOTE — ED Notes (Signed)
Mother and aunt left contact information:  Mom [phone] Gray BernhardtReba, aunt [phone]
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
## 2016-03-16 NOTE — ED Notes (Signed)
Pt very calm and cooperative however, expresses concerns about missing an interview today.

## 2016-03-16 NOTE — ED Notes (Signed)
Patient ask for drink notified nurse Dorinda Hillonald, patient given drink.
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
## 2016-03-29 ENCOUNTER — Encounter: Payer: Self-pay | Admitting: Emergency Medicine

## 2016-03-29 ENCOUNTER — Emergency Department
Admission: EM | Admit: 2016-03-29 | Discharge: 2016-03-29 | Disposition: A | Discharge status: Home / Self Care | Payer: Medicaid Other | Attending: Emergency Medicine | Admitting: Emergency Medicine

## 2016-03-29 DIAGNOSIS — Z87891 Personal history of nicotine dependence: Secondary | ICD-10-CM | POA: Diagnosis not present

## 2016-03-29 DIAGNOSIS — Z4802 Encounter for removal of sutures: Secondary | ICD-10-CM | POA: Diagnosis not present

## 2016-03-29 MED ORDER — EYE WASH OPHTH SOLN
OPHTHALMIC | Status: AC
  Filled 2016-03-29: qty 118

## 2016-03-29 MED ORDER — TETRACAINE HCL 0.5 % OP SOLN
OPHTHALMIC | Status: AC
  Filled 2016-03-29: qty 2

## 2016-03-29 MED ORDER — FLUORESCEIN SODIUM 1 MG OP STRP
ORAL_STRIP | OPHTHALMIC | Status: AC
  Filled 2016-03-29: qty 1

## 2016-03-29 NOTE — ED Notes (Signed)
Staples removed 

## 2016-03-29 NOTE — ED Notes (Signed)
Pt discharged home after verbalizing understanding of discharge instructions; nad noted. 

## 2016-03-29 NOTE — ED Notes (Addendum)
meds removed under wrong pt; these were for hernandez; mrn [phone]

## 2016-03-29 NOTE — ED Triage Notes (Signed)
Here for staple removal

## 2016-03-29 NOTE — ED Provider Notes (Signed)
[facility] Emergency Department Provider Note    ____________________________________________  Time seen: 1533  I have reviewed the triage vital signs and the nursing notes.   HISTORY  Chief Complaint Suture / Staple Removal    HPI Molly Bray is a 20 y.o. female who presents to the emergency department for staple removal. Staples were inserted hereon 03/15/16. She denies complaints.    Past Medical History:  Diagnosis Date  . Pyelonephritis   . Recurrent UTI     There are no active problems to display for this patient.   Past Surgical History:  Procedure Laterality Date  . KIDNEY SURGERY        Allergies Amoxicillin and Penicillins  No family history on file.  Social History Social History  Substance Use Topics  . Smoking status: Former Games developermoker  . Smokeless tobacco: Never Used  . Alcohol use No    Review of Systems  Constitutional: Denies fever.  HEENT: No change from baseline Respiratory: No cough or shortness of breath Musculoskeletal: No pain. Skin: healing wound; pain gradually resolving.  ____________________________________________   PHYSICAL EXAM:  VITAL SIGNS: ED Triage Vitals [03/29/16 1515]  Enc Vitals Group     BP 120/78     Pulse Rate 88     Resp 20     Temp 98 F (36.7 C)     Temp Source Oral     SpO2 98 %     Weight      Height      Head Circumference      Peak Flow      Pain Score      Pain Loc      Pain Edu?      Excl. in GC?     Constitutional: Appears well. No distress HEENT: Atraumtaic, normal appearance, EOMI, sclera normal, voice normal. Respiratory: Respirations even and unlabored.  Cardiovascular: Capillary refill normal. Peripheral pulses 2+ Musculoskeletal: Full ROM x 4. Skin: Well healing laceration to the scalp with 4 staples remaining. No evidence of infection or cellulitis. Neurovascular: Gait steady; Alert and oriented x 4.   PROCEDURES  Procedure(s) performed:  SUTURE REMOVAL Performed by:   Consent: Verbal consent obtained. Patient identity confirmed: provided demographic data Time out: Immediately prior to procedure a "time out" was called to verify the correct patient, procedure, equipment, support staff and site/side marked as required.  Location details: Scalp  Wound Appearance: clean  Sutures/Staples Removed: 4 by ER tech. Patient states that she removed the one of the staples herself.   Facility: sutures placed in this facility Patient tolerance: Patient tolerated the procedure well with no immediate complications.    ____________________________________________   INITIAL IMPRESSION / ASSESSMENT AND PLAN / ED COURSE  Pertinent labs & imaging results that were available during my care of the patient were reviewed by me and considered in my medical decision making (see chart for details).  Wound care discussed.  Patient was advised to return to the ER for symptoms that change or worsen if unable to schedule an appointment with primary care.  ____________________________________________   FINAL CLINICAL IMPRESSION(S) / ED DIAGNOSES  Final diagnoses:  Removal of staples      Chinita PesterCari B Oak Dorey, FNP 03/29/16 1607    Minna AntisKevin Paduchowski, MD 03/29/16 2308

## 2016-04-07 ENCOUNTER — Encounter: Payer: Self-pay | Admitting: Emergency Medicine

## 2016-04-07 ENCOUNTER — Emergency Department: Payer: Medicaid Other

## 2016-04-07 ENCOUNTER — Emergency Department
Admission: EM | Admit: 2016-04-07 | Discharge: 2016-04-07 | Disposition: A | Discharge status: Home / Self Care | Payer: Medicaid Other | Attending: Emergency Medicine | Admitting: Emergency Medicine

## 2016-04-07 DIAGNOSIS — Y92410 Unspecified street and highway as the place of occurrence of the external cause: Secondary | ICD-10-CM | POA: Insufficient documentation

## 2016-04-07 DIAGNOSIS — Z87891 Personal history of nicotine dependence: Secondary | ICD-10-CM | POA: Diagnosis not present

## 2016-04-07 DIAGNOSIS — R600 Localized edema: Secondary | ICD-10-CM | POA: Diagnosis present

## 2016-04-07 DIAGNOSIS — Y999 Unspecified external cause status: Secondary | ICD-10-CM | POA: Insufficient documentation

## 2016-04-07 DIAGNOSIS — Y9389 Activity, other specified: Secondary | ICD-10-CM | POA: Insufficient documentation

## 2016-04-07 DIAGNOSIS — R609 Edema, unspecified: Secondary | ICD-10-CM

## 2016-04-07 MED ORDER — FUROSEMIDE 20 MG PO TABS: 20.0000 mg | ORAL_TABLET | Freq: Every day | ORAL | 0 refills | Status: DC

## 2016-04-07 NOTE — ED Notes (Signed)
Patient transported to Ultrasound 
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
## 2016-04-07 NOTE — ED Notes (Signed)
NAD noted at time of D/C. Pt denies questions or concerns. Pt ambulatory to the lobby at this time.  

## 2016-07-02 ENCOUNTER — Encounter: Payer: Self-pay | Admitting: Emergency Medicine

## 2016-07-02 DIAGNOSIS — R51 Headache: Secondary | ICD-10-CM | POA: Insufficient documentation

## 2016-07-02 DIAGNOSIS — Z5321 Procedure and treatment not carried out due to patient leaving prior to being seen by health care provider: Secondary | ICD-10-CM | POA: Diagnosis not present

## 2016-07-02 DIAGNOSIS — Y999 Unspecified external cause status: Secondary | ICD-10-CM | POA: Insufficient documentation

## 2016-07-02 DIAGNOSIS — Y939 Activity, unspecified: Secondary | ICD-10-CM | POA: Diagnosis not present

## 2016-07-02 DIAGNOSIS — Y92480 Sidewalk as the place of occurrence of the external cause: Secondary | ICD-10-CM | POA: Diagnosis not present

## 2016-07-02 DIAGNOSIS — Z87891 Personal history of nicotine dependence: Secondary | ICD-10-CM | POA: Insufficient documentation

## 2016-07-02 DIAGNOSIS — S0990XA Unspecified injury of head, initial encounter: Secondary | ICD-10-CM | POA: Diagnosis present
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
## 2016-07-03 ENCOUNTER — Emergency Department
Admission: EM | Admit: 2016-07-03 | Discharge: 2016-07-03 | Disposition: A | Discharge status: Home / Self Care | Payer: Medicaid Other | Attending: Emergency Medicine | Admitting: Emergency Medicine

## 2016-07-03 NOTE — ED Notes (Signed)
No answer when called from lobby 

## 2016-08-30 ENCOUNTER — Other Ambulatory Visit: Payer: Self-pay

## 2016-09-14 ENCOUNTER — Emergency Department
Admission: EM | Admit: 2016-09-14 | Discharge: 2016-09-14 | Disposition: A | Discharge status: Home / Self Care | Payer: Medicaid Other | Attending: Emergency Medicine | Admitting: Emergency Medicine

## 2016-09-14 ENCOUNTER — Encounter: Payer: Self-pay | Admitting: Emergency Medicine

## 2016-09-14 ENCOUNTER — Emergency Department: Payer: Medicaid Other

## 2016-09-14 DIAGNOSIS — Z87891 Personal history of nicotine dependence: Secondary | ICD-10-CM | POA: Insufficient documentation

## 2016-09-14 DIAGNOSIS — L0390 Cellulitis, unspecified: Secondary | ICD-10-CM

## 2016-09-14 DIAGNOSIS — M79643 Pain in unspecified hand: Secondary | ICD-10-CM

## 2016-09-14 DIAGNOSIS — M79642 Pain in left hand: Secondary | ICD-10-CM | POA: Diagnosis present

## 2016-09-14 DIAGNOSIS — L03114 Cellulitis of left upper limb: Secondary | ICD-10-CM | POA: Insufficient documentation

## 2016-09-14 LAB — CBC WITH DIFFERENTIAL/PLATELET
BASOS ABS: 0.1 10*3/uL (ref 0–0.1)
BASOS PCT: 1 %
EOS ABS: 0.1 10*3/uL (ref 0–0.7)
EOS PCT: 1 %
HCT: 44.5 % (ref 35.0–47.0)
HEMOGLOBIN: 15.6 g/dL (ref 12.0–16.0)
LYMPHS ABS: 2.2 10*3/uL (ref 1.0–3.6)
Lymphocytes Relative: 16 %
MCH: 31.5 pg (ref 26.0–34.0)
MCHC: 35 g/dL (ref 32.0–36.0)
MCV: 90 fL (ref 80.0–100.0)
Monocytes Absolute: 1.2 10*3/uL — ABNORMAL HIGH (ref 0.2–0.9)
Monocytes Relative: 9 %
NEUTROS PCT: 73 %
Neutro Abs: 9.8 10*3/uL — ABNORMAL HIGH (ref 1.4–6.5)
PLATELETS: 303 10*3/uL (ref 150–440)
RBC: 4.94 MIL/uL (ref 3.80–5.20)
RDW: 13.4 % (ref 11.5–14.5)
WBC: 13.3 10*3/uL — AB (ref 3.6–11.0)

## 2016-09-14 MED ORDER — SULFAMETHOXAZOLE-TRIMETHOPRIM 800-160 MG PO TABS: 1.0000 | ORAL_TABLET | Freq: Two times a day (BID) | ORAL | 0 refills | Status: DC

## 2016-09-14 MED ORDER — LIDOCAINE 5 % EX OINT: 1.0000 "application " | TOPICAL_OINTMENT | Freq: Two times a day (BID) | CUTANEOUS | 0 refills | Status: DC | PRN
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
## 2016-09-14 NOTE — Discharge Instructions (Signed)
Please seek medical attention for any high fevers, chest pain, shortness of breath, change in behavior, persistent vomiting, bloody stool or any other new or concerning symptoms.  

## 2016-09-14 NOTE — ED Provider Notes (Signed)
-----------------------------------------   4:25 PM on 09/14/2016 -----------------------------------------  Patient apparently talked to patient relations. Stated she had further questions after she had been discharged. I told patient relations that I would be happy to see her again to answer further questions that the patient did not bring up during my exam. Did tell patient relations that she could be brought back to a room. However patient apparently refused.   Phineas SemenGraydon Shawnell Dykes, MD 09/14/16 [phone]
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
## 2016-09-14 NOTE — ED Provider Notes (Signed)
[facility] Emergency Department Provider Note  ____________________________________________   I have reviewed the triage vital signs and the nursing notes.   HISTORY  Chief Complaint Hand Pain   History limited by: Not Limited   HPI Molly Bray is a 21 y.o. female who presents to the emergency department today because of concerns for left hand pain. The patient states that she had a tattoo done roughly 1 week ago. This was done at her house. Shortly after that she started noticing some discomfort and swelling to the back of her hand. She also feels a sense of numbness to the head. No fevers.   Past Medical History:  Diagnosis Date  . Pyelonephritis   . Recurrent UTI     There are no active problems to display for this patient.   Past Surgical History:  Procedure Laterality Date  . KIDNEY SURGERY      Prior to Admission medications   Medication Sig Start Date End Date Taking? Authorizing Provider  furosemide (LASIX) 20 MG tablet Take 1 tablet (20 mg total) by mouth daily. 04/07/16 04/07/17  Joni Reiningonald K Smith, PA-C    Allergies Amoxicillin and Penicillins  History reviewed. No pertinent family history.  Social History Social History  Substance Use Topics  . Smoking status: Former Games developermoker  . Smokeless tobacco: Never Used  . Alcohol use No    Review of Systems  Constitutional: Negative for fever. Cardiovascular: Negative for chest pain. Respiratory: Negative for shortness of breath. Gastrointestinal: Negative for abdominal pain, vomiting and diarrhea. Musculoskeletal: Left hand pain. Neurological: Negative for headaches, focal weakness or numbness.  10-point ROS otherwise negative.  ____________________________________________   PHYSICAL EXAM:  VITAL SIGNS: ED Triage Vitals  Enc Vitals Group     BP 09/14/16 1438 129/79     Pulse Rate 09/14/16 1438 (!) 114     Resp 09/14/16 1438 18     Temp --      Temp src --    SpO2 09/14/16 1438 99 %     Weight 09/14/16 1439 165 lb (74.8 kg)     Height 09/14/16 1439 5\' 3"  (1.6 m)     Head Circumference --      Peak Flow --      Pain Score 09/14/16 1439 10   Constitutional: Alert and oriented. Well appearing and in no distress. Eyes: Conjunctivae are normal. Normal extraocular movements. ENT   Head: Normocephalic and atraumatic.   Nose: No congestion/rhinnorhea.   Mouth/Throat: Mucous membranes are moist. Cardiovascular: Normal rate, regular rhythm.   Respiratory: Normal respiratory effort without tachypnea nor retractions.  Musculoskeletal: Left hand radial pulse 2+. Trace ulnar pulse but symmetric when compared to right hand. Cap refill < 2 seconds in all fingers. Full flexion with some tenderness.  Neurologic:  Normal speech and language. No gross focal neurologic deficits are appreciated.  Skin:  Tattoo over left hand with some swelling. No fluctuance. Some erythema extending outside of the tattoo lines. Psychiatric: Mood and affect are normal. Speech and behavior are normal. Patient exhibits appropriate insight and judgment.  ____________________________________________    LABS (pertinent positives/negatives)  Labs Reviewed  CBC WITH DIFFERENTIAL/PLATELET - Abnormal; Notable for the following:       Result Value   WBC 13.3 (*)    Neutro Abs 9.8 (*)    Monocytes Absolute 1.2 (*)    All other components within normal limits     ____________________________________________   EKG  None  ____________________________________________    RADIOLOGY  None  ____________________________________________   PROCEDURES  Procedures  ____________________________________________   INITIAL IMPRESSION / ASSESSMENT AND PLAN / ED COURSE  Pertinent labs & imaging results that were available during my care of the patient were reviewed by me and considered in my medical decision making (see chart for details).  Patient presented to the  emergency department today with concerns for left hand pain after a tattoo performed at her house. On exam there is some swelling to the area the tattoo. Some erythema extending outside the lines of the tattoo suggesting cellulitis. Pulses present. Cap refill normal. At this time think cellulitis is possible. Will place patient on antibiotics. Additionally discussed importance of follow up in 1-2 days. Will give patient orthopedic follow up information.  ____________________________________________   FINAL CLINICAL IMPRESSION(S) / ED DIAGNOSES  Final diagnoses:  Pain of hand, unspecified laterality  Cellulitis, unspecified cellulitis site     Note: This dictation was prepared with Dragon dictation. Any transcriptional errors that result from this process are unintentional     Phineas Semen, MD 09/14/16 [phone]

## 2016-09-14 NOTE — ED Notes (Signed)
Discussed with dr Fanny Bienquale. orders per recommendations.

## 2016-09-24 ENCOUNTER — Emergency Department
Admission: EM | Admit: 2016-09-24 | Discharge: 2016-09-24 | Discharge status: Absent without leave | Payer: Medicaid Other

## 2016-09-24 NOTE — ED Notes (Signed)
Pt noted leaving lobby 

## 2016-10-01 ENCOUNTER — Encounter: Payer: Self-pay | Admitting: *Deleted

## 2016-10-01 ENCOUNTER — Telehealth: Payer: Self-pay | Admitting: Certified Nurse Midwife

## 2016-10-01 ENCOUNTER — Emergency Department
Admission: EM | Admit: 2016-10-01 | Discharge: 2016-10-01 | Disposition: A | Discharge status: Home / Self Care | Payer: Medicaid Other | Attending: Emergency Medicine | Admitting: Emergency Medicine

## 2016-10-01 DIAGNOSIS — Z5321 Procedure and treatment not carried out due to patient leaving prior to being seen by health care provider: Secondary | ICD-10-CM | POA: Diagnosis not present

## 2016-10-01 DIAGNOSIS — N939 Abnormal uterine and vaginal bleeding, unspecified: Secondary | ICD-10-CM | POA: Insufficient documentation

## 2016-10-01 LAB — CBC
HCT: 43.9 % (ref 35.0–47.0)
Hemoglobin: 15.1 g/dL (ref 12.0–16.0)
MCH: 31 pg (ref 26.0–34.0)
MCHC: 34.3 g/dL (ref 32.0–36.0)
MCV: 90.3 fL (ref 80.0–100.0)
PLATELETS: 318 10*3/uL (ref 150–440)
RBC: 4.86 MIL/uL (ref 3.80–5.20)
RDW: 13.9 % (ref 11.5–14.5)
WBC: 12.8 10*3/uL — AB (ref 3.6–11.0)

## 2016-10-01 LAB — HCG, QUANTITATIVE, PREGNANCY: HCG, BETA CHAIN, QUANT, S: 1 m[IU]/mL (ref <5)

## 2016-10-01 LAB — POCT PREGNANCY, URINE: PREG TEST UR: NEGATIVE

## 2016-10-01 NOTE — ED Notes (Addendum)
Pt states her [facility]. Pt states she is leaving and [facility]. Pt in no apparent distress.

## 2016-10-01 NOTE — Telephone Encounter (Signed)
Patient walked into clinic to be seen stating that [facility] didn't have anything any time soon, but recommended that she come to [facility] to possibly be a walk-in. Patient c/o "bad ovarian pain," passing blood clots and that she took 3 pregnancy tests, all showing a positive result. Spoke with Pattricia BossAnnie, CNM to see if patient could be seen in clinic today, she advised that she could see her only if she wasn't in excruciating pain. When asked patient, she stated that her ovaries hurt really bad and what should she do. Patient advised to go be examined by ER physician, who would be able to better supply the care that she needed in case of an ectopic pregnancy. Patient verbalized understanding.

## 2016-10-01 NOTE — ED Triage Notes (Signed)
States positive pregnancy test 3 weeks ago, states Tuesday she began to have vaginal bleeding and cramping, states she was unable to get into obgyn, states she believes she may have a miscarriage or eptopic, awake and alert

## 2016-11-18 ENCOUNTER — Encounter: Payer: Self-pay | Admitting: Emergency Medicine

## 2016-11-18 ENCOUNTER — Emergency Department
Admission: EM | Admit: 2016-11-18 | Discharge: 2016-11-19 | Disposition: A | Discharge status: Home / Self Care | Payer: Medicaid Other | Attending: Emergency Medicine | Admitting: Emergency Medicine

## 2016-11-18 ENCOUNTER — Emergency Department: Payer: Medicaid Other

## 2016-11-18 DIAGNOSIS — Y92009 Unspecified place in unspecified non-institutional (private) residence as the place of occurrence of the external cause: Secondary | ICD-10-CM | POA: Diagnosis not present

## 2016-11-18 DIAGNOSIS — S0083XA Contusion of other part of head, initial encounter: Secondary | ICD-10-CM | POA: Diagnosis not present

## 2016-11-18 DIAGNOSIS — Y9389 Activity, other specified: Secondary | ICD-10-CM | POA: Diagnosis not present

## 2016-11-18 DIAGNOSIS — Z87891 Personal history of nicotine dependence: Secondary | ICD-10-CM | POA: Insufficient documentation

## 2016-11-18 DIAGNOSIS — S0990XA Unspecified injury of head, initial encounter: Secondary | ICD-10-CM | POA: Diagnosis present

## 2016-11-18 DIAGNOSIS — Z79899 Other long term (current) drug therapy: Secondary | ICD-10-CM | POA: Diagnosis not present

## 2016-11-18 DIAGNOSIS — H1131 Conjunctival hemorrhage, right eye: Secondary | ICD-10-CM | POA: Insufficient documentation

## 2016-11-18 DIAGNOSIS — Y999 Unspecified external cause status: Secondary | ICD-10-CM | POA: Insufficient documentation

## 2016-11-18 MED ORDER — ONDANSETRON 4 MG PO TBDP
4.0000 mg | ORAL_TABLET | Freq: Once | ORAL | Status: AC
  Administered 2016-11-19: 4 mg via ORAL
  Filled 2016-11-18: qty 1
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
## 2016-11-19 ENCOUNTER — Emergency Department: Payer: Medicaid Other

## 2016-11-19 MED ORDER — IBUPROFEN 800 MG PO TABS: 800.0000 mg | ORAL_TABLET | Freq: Three times a day (TID) | ORAL | 0 refills | Status: DC | PRN

## 2016-11-19 MED ORDER — IBUPROFEN 800 MG PO TABS
800.0000 mg | ORAL_TABLET | Freq: Once | ORAL | Status: AC
  Administered 2016-11-19: 800 mg via ORAL
  Filled 2016-11-19: qty 1

## 2016-11-19 MED ORDER — HYDROCODONE-ACETAMINOPHEN 5-325 MG PO TABS: 1.0000 | ORAL_TABLET | Freq: Four times a day (QID) | ORAL | 0 refills | Status: DC | PRN

## 2016-11-19 MED ORDER — HYDROCODONE-ACETAMINOPHEN 5-325 MG PO TABS
1.0000 | ORAL_TABLET | Freq: Once | ORAL | Status: AC
  Administered 2016-11-19: 1 via ORAL
  Filled 2016-11-19: qty 1

## 2016-11-19 NOTE — ED Notes (Signed)
Second attempt to get CT scan done and patient is stating she can't go because she is in too much pain. First attempt she stated she was too sick on her stomach. After Zofran given, and second attempt at CT patient strongly encouraged to get the test done so her results can be read.

## 2016-11-19 NOTE — Discharge Instructions (Signed)
1. Apply ice to affected area several times daily to reduce swelling. 2. You may take pain medicines as needed (Motrin/Norco #15). 3. Return to the ER for worsening symptoms, persistent vomiting, lethargy or other concerns.
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
## 2016-11-19 NOTE — ED Notes (Signed)
Patient and boyfriend laying in the stretcher together. Asked boyfriend to get out of the bed as it was not appropriate for two patients to be in the same stretcher. Patient tolerated SL Zofran without incident.

## 2016-12-28 ENCOUNTER — Encounter: Payer: Self-pay | Admitting: Emergency Medicine

## 2016-12-28 DIAGNOSIS — Z3A01 Less than 8 weeks gestation of pregnancy: Secondary | ICD-10-CM | POA: Insufficient documentation

## 2016-12-28 DIAGNOSIS — R1030 Lower abdominal pain, unspecified: Secondary | ICD-10-CM | POA: Diagnosis present

## 2016-12-28 DIAGNOSIS — F17210 Nicotine dependence, cigarettes, uncomplicated: Secondary | ICD-10-CM | POA: Insufficient documentation

## 2016-12-28 LAB — URINALYSIS, COMPLETE (UACMP) WITH MICROSCOPIC
Bilirubin Urine: NEGATIVE
Glucose, UA: NEGATIVE mg/dL
Hgb urine dipstick: NEGATIVE
KETONES UR: NEGATIVE mg/dL
Nitrite: NEGATIVE
PH: 6 (ref 5.0–8.0)
Protein, ur: NEGATIVE mg/dL
SPECIFIC GRAVITY, URINE: 1.015 (ref 1.005–1.030)

## 2016-12-28 LAB — POCT PREGNANCY, URINE: PREG TEST UR: POSITIVE — AB

## 2016-12-28 NOTE — ED Triage Notes (Signed)
Pt states that she has been having lower abdominal and back pain. Pt states that she had a positive pregnancy test about ten minutes before she came here and "I got it with me". Pt reports that she is here to confirm pregnancy and to make sure that is why she is having pains. Pt is in NAD at this time in triage.

## 2016-12-29 ENCOUNTER — Encounter: Payer: Self-pay | Admitting: Radiology

## 2016-12-29 ENCOUNTER — Emergency Department: Payer: Medicaid Other

## 2016-12-29 ENCOUNTER — Emergency Department
Admission: EM | Admit: 2016-12-29 | Discharge: 2016-12-29 | Disposition: A | Discharge status: Home / Self Care | Payer: Medicaid Other | Attending: Emergency Medicine | Admitting: Emergency Medicine

## 2016-12-29 DIAGNOSIS — Z3A01 Less than 8 weeks gestation of pregnancy: Secondary | ICD-10-CM

## 2016-12-29 DIAGNOSIS — R1030 Lower abdominal pain, unspecified: Secondary | ICD-10-CM

## 2016-12-29 LAB — CBC
HCT: 42.3 % (ref 35.0–47.0)
Hemoglobin: 14.3 g/dL (ref 12.0–16.0)
MCH: 31 pg (ref 26.0–34.0)
MCHC: 33.9 g/dL (ref 32.0–36.0)
MCV: 91.3 fL (ref 80.0–100.0)
PLATELETS: 281 10*3/uL (ref 150–440)
RBC: 4.63 MIL/uL (ref 3.80–5.20)
RDW: 13.3 % (ref 11.5–14.5)
WBC: 12.8 10*3/uL — ABNORMAL HIGH (ref 3.6–11.0)

## 2016-12-29 LAB — WET PREP, GENITAL
CLUE CELLS WET PREP: NONE SEEN
Sperm: NONE SEEN
Trich, Wet Prep: NONE SEEN
YEAST WET PREP: NONE SEEN

## 2016-12-29 LAB — COMPREHENSIVE METABOLIC PANEL
ALT: 17 U/L (ref 14–54)
ANION GAP: 7 (ref 5–15)
AST: 22 U/L (ref 15–41)
Albumin: 4.6 g/dL (ref 3.5–5.0)
Alkaline Phosphatase: 68 U/L (ref 38–126)
BUN: 7 mg/dL (ref 6–20)
CHLORIDE: 107 mmol/L (ref 101–111)
CO2: 24 mmol/L (ref 22–32)
Calcium: 9.4 mg/dL (ref 8.9–10.3)
Creatinine, Ser: 0.68 mg/dL (ref 0.44–1.00)
Glucose, Bld: 118 mg/dL — ABNORMAL HIGH (ref 65–99)
POTASSIUM: 3.3 mmol/L — AB (ref 3.5–5.1)
Sodium: 138 mmol/L (ref 135–145)
Total Bilirubin: 0.5 mg/dL (ref 0.3–1.2)
Total Protein: 7.6 g/dL (ref 6.5–8.1)

## 2016-12-29 LAB — CHLAMYDIA/NGC RT PCR (ARMC ONLY)
Chlamydia Tr: NOT DETECTED
N GONORRHOEAE: NOT DETECTED

## 2016-12-29 LAB — HCG, QUANTITATIVE, PREGNANCY: hCG, Beta Chain, Quant, S: 27669 m[IU]/mL — ABNORMAL HIGH (ref <5)

## 2016-12-29 NOTE — ED Provider Notes (Signed)
[facility] Emergency Department Provider Note   ____________________________________________   First MD Initiated Contact with Patient 12/29/16 0101     (approximate)  I have reviewed the triage vital signs and the nursing notes.   HISTORY  Chief Complaint Abdominal Pain (positive home pregnancy test) and Possible Pregnancy    HPI Leah Thornberry is a 21 y.o. female who comes into the hospital today with some abdominal pain. The patient states that she took a pregnancy test earlier today and it was positive. She had a test at the beginning of the month and she reports it was negative. She's been having some breast sensitivity as well as some nausea. She reports that her abdominal pain has also been going on intermittently since the beginning of the month and it goes around to her back. The patient's last menstrual period was around Mother's Day she states May 14. She was due to have her period yesterday and she states that it never came. The patient is a G3 P0 111. The patient reports her last initial period was normal. She states that her pain as a 7 out of 10 in intensity and she is been taking Tylenol for pain. She denies any vaginal bleeding but has had some discharge. The patient's here today for evaluation.   Past Medical History:  Diagnosis Date  . Pyelonephritis   . Recurrent UTI     There are no active problems to display for this patient.   Past Surgical History:  Procedure Laterality Date  . KIDNEY SURGERY      Prior to Admission medications   Medication Sig Start Date End Date Taking? Authorizing Provider  furosemide (LASIX) 20 MG tablet Take 1 tablet (20 mg total) by mouth daily. 04/07/16 04/07/17  Joni Reining, PA-C  HYDROcodone-acetaminophen (NORCO) 5-325 MG tablet Take 1 tablet by mouth every 6 (six) hours as needed for moderate pain. 11/19/16   Irean Hong, MD  ibuprofen (ADVIL,MOTRIN) 800 MG tablet Take 1 tablet (800 mg  total) by mouth every 8 (eight) hours as needed for moderate pain. 11/19/16   Irean Hong, MD  lidocaine (XYLOCAINE) 5 % ointment Apply 1 application topically 2 (two) times daily as needed. 09/14/16   Phineas Semen, MD  sulfamethoxazole-trimethoprim (BACTRIM DS,SEPTRA DS) 800-160 MG tablet Take 1 tablet by mouth 2 (two) times daily. 09/14/16   Phineas Semen, MD    Allergies Amoxicillin and Penicillins  No family history on file.  Social History Social History  Substance Use Topics  . Smoking status: Current Every Day Smoker    Packs/day: 0.50    Types: Cigarettes  . Smokeless tobacco: Never Used  . Alcohol use No    Review of Systems  Constitutional: No fever/chills Eyes: No visual changes. ENT: No sore throat. Cardiovascular: Denies chest pain. Respiratory: Denies shortness of breath. Gastrointestinal:  abdominal pain.   nausea, no vomiting.  No diarrhea.  No constipation. Genitourinary: Negative for dysuria. Musculoskeletal:  back pain. Skin: Negative for rash. Neurological: Negative for headaches, focal weakness or numbness.   ____________________________________________   PHYSICAL EXAM:  VITAL SIGNS: ED Triage Vitals  Enc Vitals Group     BP 12/28/16 2226 (!) 114/52     Pulse Rate 12/28/16 2226 (!) 107     Resp 12/28/16 2226 16     Temp 12/28/16 2226 98.6 F (37 C)     Temp Source 12/28/16 2226 Oral     SpO2 12/28/16 2226 100 %  Weight 12/28/16 2228 165 lb (74.8 kg)     Height 12/28/16 2228 5\' 6"  (1.676 m)     Head Circumference --      Peak Flow --      Pain Score 12/28/16 2226 7     Pain Loc --      Pain Edu? --      Excl. in GC? --     Constitutional: Alert and oriented. Well appearing and in Mild distress. Eyes: Conjunctivae are normal. PERRL. EOMI. Head: Atraumatic. Nose: No congestion/rhinnorhea. Mouth/Throat: Mucous membranes are moist.  Oropharynx non-erythematous. Cardiovascular: Normal rate, regular rhythm. Grossly normal heart  sounds.  Good peripheral circulation. Respiratory: Normal respiratory effort.  No retractions. Lungs CTAB. Gastrointestinal: Soft and nontender. No distention.Positive bowel sounds Genitourinary: Normal external genitalia, scant vaginal discharge, no cervical motion tenderness to palpation, some discomfort in the adnexa to palpation. Musculoskeletal: No lower extremity tenderness nor edema.  . Neurologic:  Normal speech and language.  Skin:  Skin is warm, dry and intact. Marland Kitchen Psychiatric: Mood and affect are normal.  ____________________________________________   LABS (all labs ordered are listed, but only abnormal results are displayed)  Labs Reviewed  WET PREP, GENITAL - Abnormal; Notable for the following:       Result Value   WBC, Wet Prep HPF POC MANY (*)    All other components within normal limits  URINALYSIS, COMPLETE (UACMP) WITH MICROSCOPIC - Abnormal; Notable for the following:    Color, Urine YELLOW (*)    APPearance CLEAR (*)    Leukocytes, UA TRACE (*)    Bacteria, UA RARE (*)    Squamous Epithelial / LPF 0-5 (*)    All other components within normal limits  CBC - Abnormal; Notable for the following:    WBC 12.8 (*)    All other components within normal limits  COMPREHENSIVE METABOLIC PANEL - Abnormal; Notable for the following:    Potassium 3.3 (*)    Glucose, Bld 118 (*)    All other components within normal limits  HCG, QUANTITATIVE, PREGNANCY - Abnormal; Notable for the following:    hCG, Beta Chain, Quant, S 27,669 (*)    All other components within normal limits  POCT PREGNANCY, URINE - Abnormal; Notable for the following:    Preg Test, Ur POSITIVE (*)    All other components within normal limits  CHLAMYDIA/NGC RT PCR [facility] ONLY)   ____________________________________________  EKG  none ____________________________________________  RADIOLOGY  US Ob Comp Less 14 Wks  Result Date: 12/29/2016 CLINICAL DATA:  Acute onset of lower abdominal  and lower back pain. Initial encounter. EXAM: OBSTETRIC <14 WK [place] AND TRANSVAGINAL OB US TECHNIQUE: Both transabdominal and transvaginal ultrasound examinations were performed for complete evaluation of the gestation as well as the maternal uterus, adnexal regions, and pelvic cul-de-sac. Transvaginal technique was performed to assess early pregnancy. COMPARISON:  None. FINDINGS: Intrauterine gestational sac: Single; visualized and normal in shape. Yolk sac:  Yes Embryo:  No Cardiac Activity: N/A MSD: 1.1 cm   5 w   6  d Subchorionic hemorrhage:  None visualized. Maternal uterus/adnexae: The uterus is otherwise unremarkable in appearance. The ovaries are within normal limits. The right ovary measures 2.8 x 1.5 x 1.7 cm, while the left ovary measures 3.5 x 2.2 x 1.9 cm. No suspicious adnexal masses are seen; there is no evidence for ovarian torsion. No free fluid is seen within the pelvic cul-de-sac. IMPRESSION: Single intrauterine gestational sac noted, with a mean sac diameter of  1.1 cm. A yolk sac is seen. No embryo is yet visualized. This reflects a gestational age of [redacted] weeks 6 days, which does not match the gestational age by LMP, though it remains too early to determine a new estimated date of delivery. If the patient's quantitative beta HCG level continues to rise, would perform follow-up pelvic ultrasound in 2 weeks to confirm the gestational age and estimated date of delivery. Electronically Signed   By: Roanna RaiderJeffery  Chang M.D.   On: 12/29/2016 02:40   [place] Ob Transvaginal  Result Date: 12/29/2016 CLINICAL DATA:  Acute onset of lower abdominal and lower back pain. Initial encounter. EXAM: OBSTETRIC <14 WK US AND TRANSVAGINAL OB US TECHNIQUE: Both transabdominal and transvaginal ultrasound examinations were performed for complete evaluation of the gestation as well as the maternal uterus, adnexal regions, and pelvic cul-de-sac. Transvaginal technique was performed to assess early pregnancy. COMPARISON:  None.  FINDINGS: Intrauterine gestational sac: Single; visualized and normal in shape. Yolk sac:  Yes Embryo:  No Cardiac Activity: N/A MSD: 1.1 cm   5 w   6  d Subchorionic hemorrhage:  None visualized. Maternal uterus/adnexae: The uterus is otherwise unremarkable in appearance. The ovaries are within normal limits. The right ovary measures 2.8 x 1.5 x 1.7 cm, while the left ovary measures 3.5 x 2.2 x 1.9 cm. No suspicious adnexal masses are seen; there is no evidence for ovarian torsion. No free fluid is seen within the pelvic cul-de-sac. IMPRESSION: Single intrauterine gestational sac noted, with a mean sac diameter of 1.1 cm. A yolk sac is seen. No embryo is yet visualized. This reflects a gestational age of [redacted] weeks 6 days, which does not match the gestational age by LMP, though it remains too early to determine a new estimated date of delivery. If the patient's quantitative beta HCG level continues to rise, would perform follow-up pelvic ultrasound in 2 weeks to confirm the gestational age and estimated date of delivery. Electronically Signed   By: Roanna RaiderJeffery  Chang M.D.   On: 12/29/2016 02:40    ____________________________________________   PROCEDURES  Procedure(s) performed: None  Procedures  Critical Care performed: No  ____________________________________________   INITIAL IMPRESSION / ASSESSMENT AND PLAN / ED COURSE  Pertinent labs & imaging results that were available during my care of the patient were reviewed by me and considered in my medical decision making (see chart for details).  This is a 21 year old female who comes into the hospital today with some abdominal pain. The patient had a pregnancy test that was positive today. We will check some blood work and a pelvic exam. I will send the patient for an ultrasound given her abdominal pain to evaluate for possible ectopic pregnancy.  Clinical Course as of Dec 29 301  Sat Dec 29, 2016  0252 Single intrauterine gestational sac noted,  with a mean sac diameter of 1.1 cm. A yolk sac is seen. No embryo is yet visualized. This reflects a gestational age of [redacted] weeks 6 days, which does not match the gestational age by LMP, though it remains too early to determine a new estimated date of delivery.  If the patient's quantitative beta HCG level continues to rise, would perform follow-up pelvic ultrasound in 2 weeks to confirm the gestational age and estimated date of delivery.   US OB Comp Less 14 Wks [AW]  0258 The patient's ultrasound shows a gestational sac and yolk sac but does not see an embryo. At this point in embryo would be expected but  it is possible that the patient may have a blighted ovum. The patient is ready to be discharged home so she will be discharged to follow-up with OB/GYN or the health department.  [AW]    Clinical Course User Index [AW] Zenda Alpers Melchor Amour, MD     ____________________________________________   FINAL CLINICAL IMPRESSION(S) / ED DIAGNOSES  Final diagnoses:  Less than [redacted] weeks gestation of pregnancy  Lower abdominal pain      NEW MEDICATIONS STARTED DURING THIS VISIT:  New Prescriptions   No medications on file     Note:  This document was prepared using Dragon voice recognition software and may include unintentional dictation errors.    Rebecka Apley, MD 12/29/16 [phone]

## 2016-12-29 NOTE — ED Notes (Signed)
Patient transported to Ultrasound 

## 2016-12-29 NOTE — Discharge Instructions (Signed)
These follow-up with OB/GYN in 2 days for repeat of her blood work and another ultrasound.

## 2016-12-29 NOTE — ED Notes (Signed)
Pt states positive pregnancy test yesterday. Pt states she has lower back pain and pelvic pain. Pt denies fever, states has had some nausea. Pt talking rapidly on phone in no acute distress. Pt states last menstral period was 11/26/2016.

## 2016-12-31 ENCOUNTER — Encounter: Payer: Self-pay | Admitting: Obstetrics and Gynecology

## 2016-12-31 ENCOUNTER — Ambulatory Visit (INDEPENDENT_AMBULATORY_CARE_PROVIDER_SITE_OTHER): Payer: Medicaid Other | Admitting: Obstetrics and Gynecology

## 2016-12-31 VITALS — BP 102/54 | HR 105 | Ht 63.0 in | Wt 159.0 lb

## 2016-12-31 DIAGNOSIS — Z3A01 Less than 8 weeks gestation of pregnancy: Secondary | ICD-10-CM

## 2016-12-31 DIAGNOSIS — O3680X0 Pregnancy with inconclusive fetal viability, not applicable or unspecified: Secondary | ICD-10-CM

## 2016-12-31 NOTE — Patient Instructions (Signed)
"  Society of [employer] in Ultrasound Guidelines for Transvaginal Ultrasonographic Diagnosis of Early Pregnancy Loss" and adopted in ACOG Practice Bulletin Number 150, May 2015 (reaffirmed 2017) "Early Pregnancy Loss"  - CRL of 7mm or greater and absence of fetal heartbeat  - Mean sac diameter of 25mm or greater and no embryo  - Absence of embryo with a discernable heartbeat 2 week after initial ultrasound showing a gestational sac without a yolk sac  - Absence of embryo with a discernable heartbeat 11 days after initial ultrasound showing a gestational sac with  yolk sac present

## 2016-12-31 NOTE — Progress Notes (Signed)
Obstetrics & Gynecology Office Visit   Chief Complaint:  Chief Complaint  Patient presents with  . ER follow up    no more cramping    History of Present Illness: 21 yo G2P0101 at approximately [redacted] week gestation presenting for ER follow up of positive urine pregnancy test and lower abdominal pain and cramping.  HCG 12/29/2016 27,669 mIU/mL with US showing early intrauterine gestational sac, yolk sac, no fetal pole or cardiac activity.  No bleeding at time of ER presentation or since.  Cramping symptoms have improved.  No history of prior 1st trimester miscarriage.  No trauma.     Review of Systems: 10 point review of systems negative unless otherwise noted in HPI  Past Medical History:  Past Medical History:  Diagnosis Date  . Pyelonephritis   . Recurrent UTI     Past Surgical History:  Past Surgical History:  Procedure Laterality Date  . KIDNEY SURGERY      Gynecologic History: Patient's last menstrual period was 11/26/2016.  Obstetric History: G1P0  Family History:  History reviewed. No pertinent family history.  Social History:  Social History   Social History  . Marital status: Legally Separated    Spouse name: N/A  . Number of children: N/A  . Years of education: N/A   Occupational History  . Not on file.   Social History Main Topics  . Smoking status: Current Every Day Smoker    Packs/day: 0.50    Types: Cigarettes  . Smokeless tobacco: Never Used  . Alcohol use No  . Drug use: No  . Sexual activity: Yes   Other Topics Concern  . Not on file   Social History Narrative  . No narrative on file    Allergies: PCN  Medications: Prior to Admission medications   Medication Sig Start Date End Date Taking? Authorizing Provider  furosemide (LASIX) 20 MG tablet Take 1 tablet (20 mg total) by mouth daily. 04/07/16 04/07/17 Yes Joni ReiningSmith, Ronald K, PA-C  HYDROcodone-acetaminophen (NORCO) 5-325 MG tablet Take 1 tablet by mouth every 6 (six) hours as needed  for moderate pain. 11/19/16  Yes Irean HongSung, Jade J, MD  ibuprofen (ADVIL,MOTRIN) 800 MG tablet Take 1 tablet (800 mg total) by mouth every 8 (eight) hours as needed for moderate pain. 11/19/16  Yes Irean HongSung, Jade J, MD  lidocaine (XYLOCAINE) 5 % ointment Apply 1 application topically 2 (two) times daily as needed. 09/14/16  Yes Phineas SemenGoodman, Graydon, MD  sulfamethoxazole-trimethoprim (BACTRIM DS,SEPTRA DS) 800-160 MG tablet Take 1 tablet by mouth 2 (two) times daily. 09/14/16  Yes Phineas SemenGoodman, Graydon, MD    Physical Exam Vitals:  Vitals:   12/31/16 1438  BP: (!) 102/54  Pulse: (!) 105   Patient's last menstrual period was 11/26/2016.  General: NAD HEENT: normocephalic, anicteric Thyroid: no enlargement, no palpable nodules Pulmonary: No increased work of breathing Extremities: no edema, erythema, or tenderness Neurologic: Grossly intact Psychiatric: mood appropriate, affect full  Female chaperone present for pelvic and breast  portions of the physical exam  Assessment: 21 y.o. G2P0101 early pregnancy of uncertain viability Plan: Problem List Items Addressed This Visit    None    Visit Diagnoses    Pregnancy with inconclusive fetal viability, single or unspecified fetus    -  Primary   Relevant Orders   US OB Transvaginal   Beta HCG, Quant   [redacted] weeks gestation of pregnancy       Relevant Orders   US OB Transvaginal  Beta HCG, Quant      "Society of Radiologyst in Ultrasound Guidelines for Transvaginal Ultrasonographic Diagnosis of Early Pregnancy Loss" and adopted in ACOG Practice Bulletin Number 150, May 2015 (reaffirmed 2017) "Early Pregnancy Loss"  - CRL of 7mm or greater and absence of fetal heartbeat  - Mean sac diameter of 25mm or greater and no embryo  - Absence of embryo with a discernable heartbeat 2 week after initial ultrasound showing a gestational sac without a yolk sac  - Absence of embryo with a discernable heartbeat 11 days after initial ultrasound showing a gestational sac  with  yolk sac present (Will repeat ultrasound in 11 day from 6/16  MSD 1.1cm on 6/16 with yolk sac but no fetal pole yet.  Repeat US 11-14 days.  Repeat HCG today  A total of 15 minutes were spent in face-to-face contact with the patient during this encounter with over half of that time devoted to counseling and coordination of care.

## 2017-01-01 ENCOUNTER — Encounter: Payer: Self-pay | Admitting: Obstetrics and Gynecology

## 2017-01-01 LAB — BETA HCG QUANT (REF LAB): hCG Quant: 34492 m[IU]/mL

## 2017-01-04 NOTE — Telephone Encounter (Signed)
Patient confirmed appointments

## 2017-01-08 ENCOUNTER — Ambulatory Visit (INDEPENDENT_AMBULATORY_CARE_PROVIDER_SITE_OTHER): Payer: Medicaid Other

## 2017-01-08 DIAGNOSIS — Z3A01 Less than 8 weeks gestation of pregnancy: Secondary | ICD-10-CM | POA: Diagnosis not present

## 2017-01-08 DIAGNOSIS — O3680X0 Pregnancy with inconclusive fetal viability, not applicable or unspecified: Secondary | ICD-10-CM

## 2017-01-08 IMAGING — US US TRANSVAGINAL NON-OB
1 series · 13 of 25 positions shown · non-contrast
Comparison: 06/20/2012

CLINICAL DATA: Left lower quadrant pain and tenderness for 1 week.
LMP 08/26/2015.

EXAM:
TRANSABDOMINAL AND TRANSVAGINAL ULTRASOUND OF PELVIS
DOPPLER ULTRASOUND OF OVARIES
TECHNIQUE: Both transabdominal and transvaginal ultrasound examinations of the
pelvis were performed. Transabdominal technique was performed for
global imaging of the pelvis including uterus, ovaries, adnexal
regions, and pelvic cul-de-sac.
It was necessary to proceed with endovaginal exam following the
transabdominal exam to visualize the caps endometrium, ovaries.
Color and duplex Doppler ultrasound was utilized to evaluate blood
flow to the ovaries.

[Series 1: us transvaginal non-ob · 0.20mm/px · 13 of 106 slices shown]
[im 1/106]
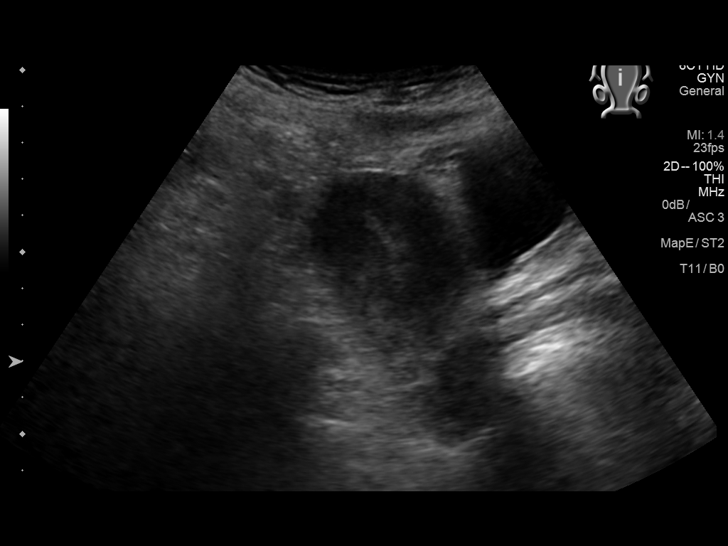
[im 9/106]
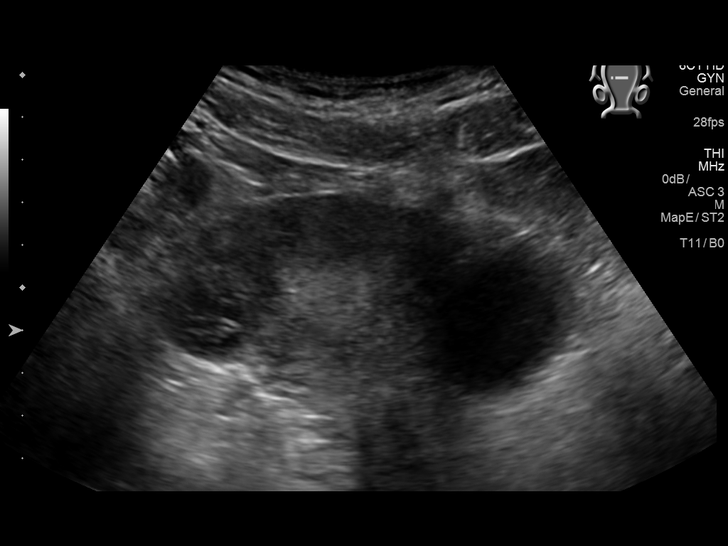
[im 18/106]
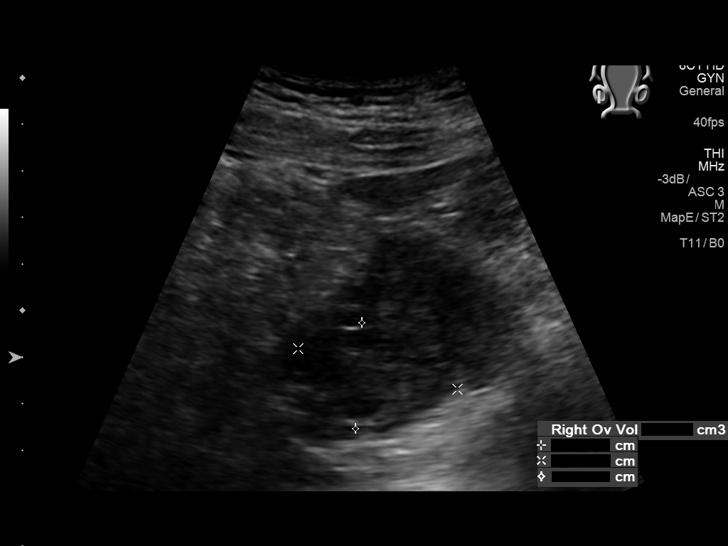
[im 27/106]
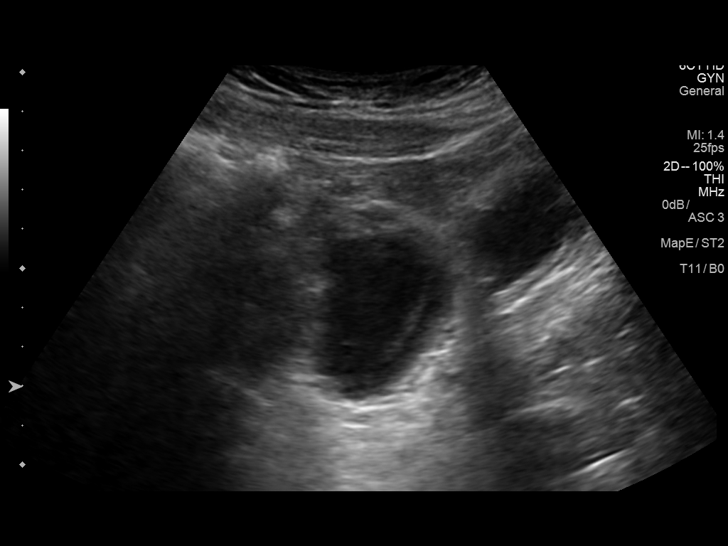
[im 36/106]
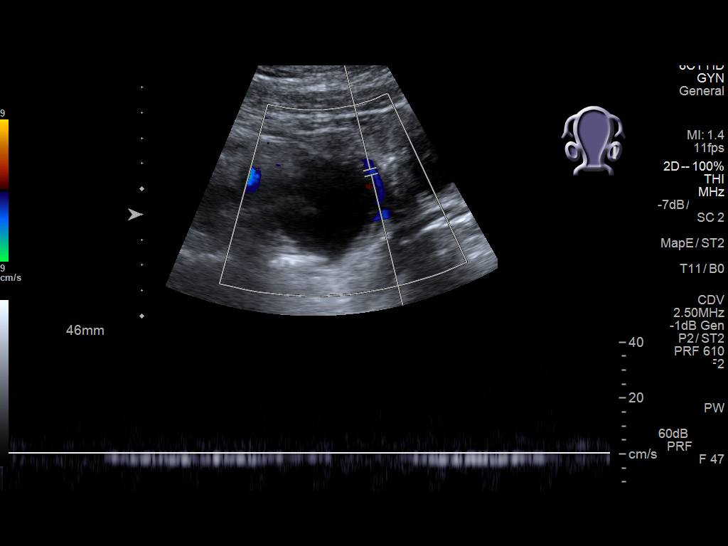
[im 44/106]
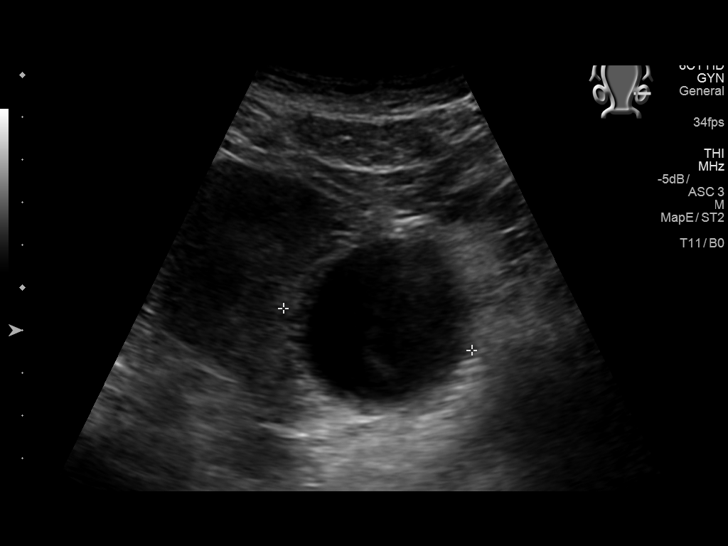
[im 53/106]
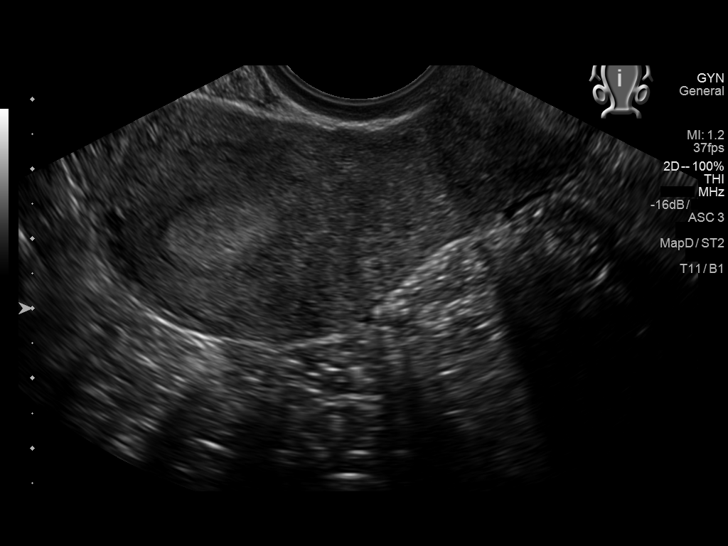
[im 62/106]
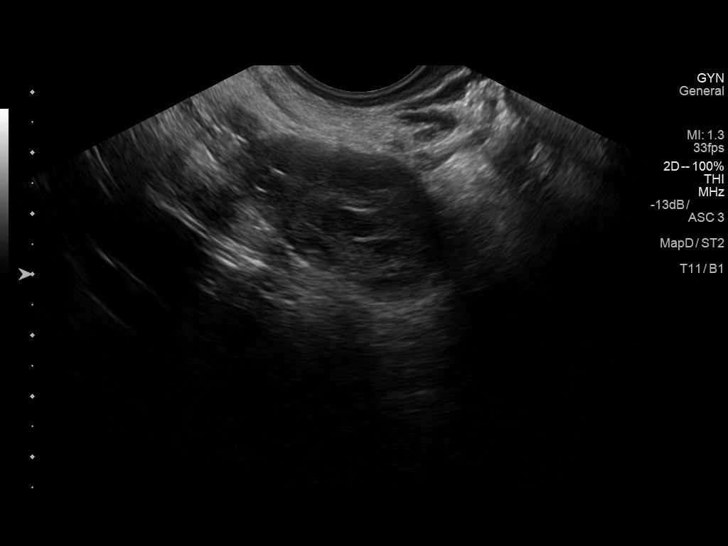
[im 71/106]
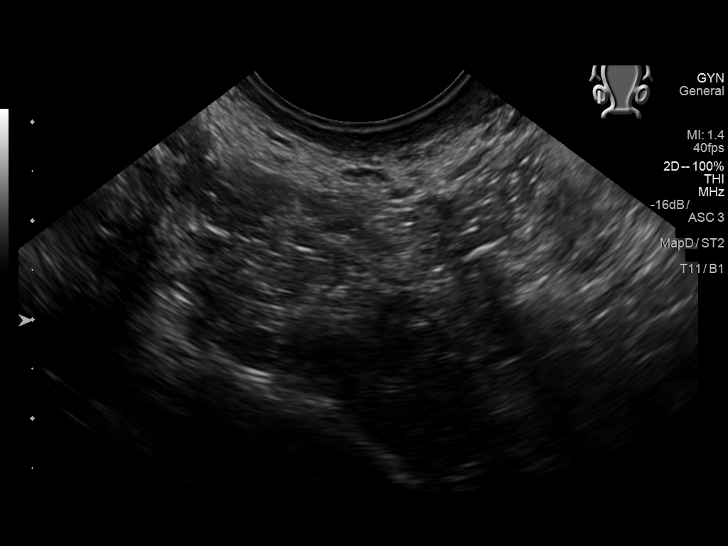
[im 79/106]
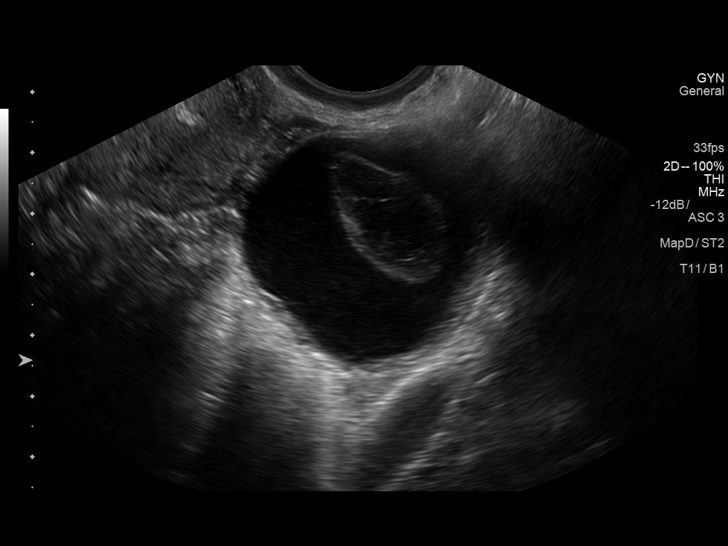
[im 88/106]
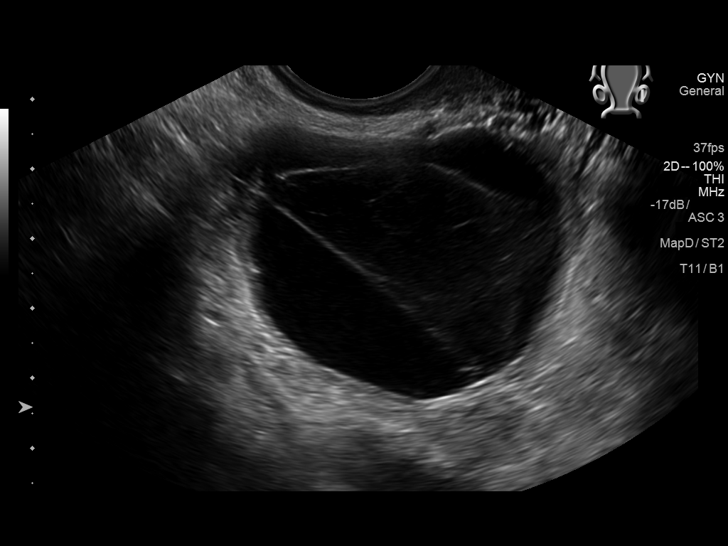
[im 97/106]
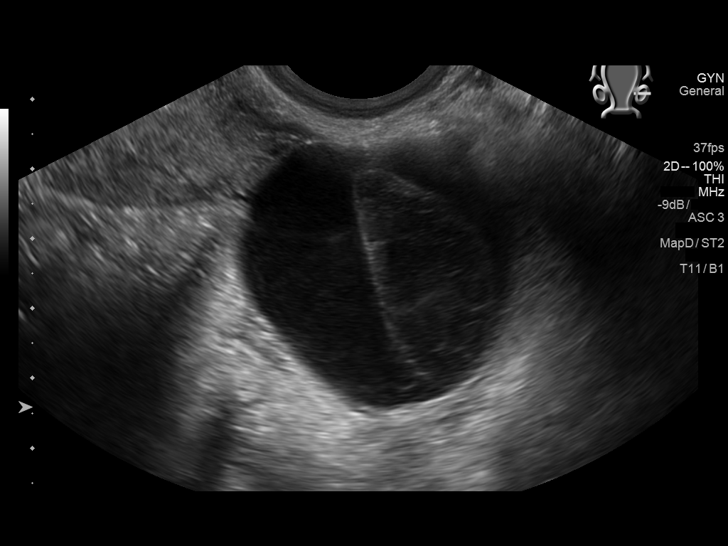
[im 106/106]
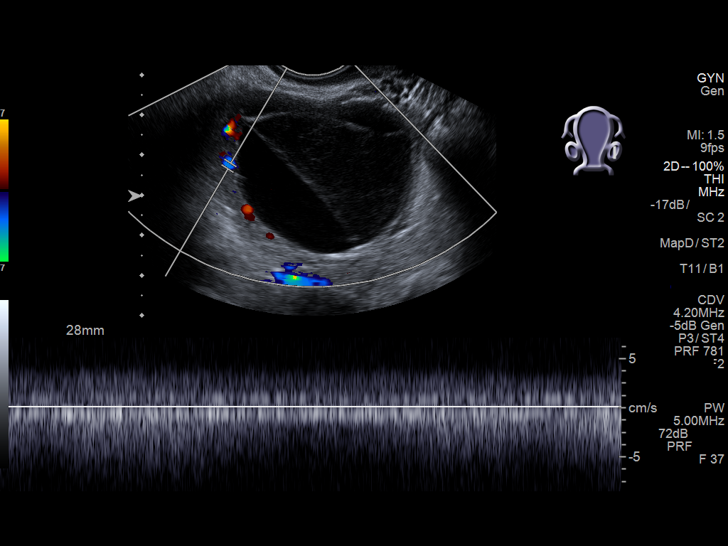

[13 of 25 positions shown; findings below may reference images not displayed]

FINDINGS: Uterus

Measurements: 8.1 x 3.8 x 3.4 cm. No fibroids or other mass
visualized.

Endometrium

Thickness: 12.1 mm.  No focal abnormality visualized.

Right ovary

Measurements: 4.5 x 2.2 x 2.7 cm. Normal appearance/no adnexal mass.

Left ovary

Measurements: 5.8 x 3.9 x 5.0 cm. A mixed echogenicity mass is
identified in the left ovary measuring 4.5 x 4.0 x 4.0 cm. Portions
are anechoic and there is a hypoechoic mass likely representing
retracted clot within this cyst. No internal vascularity is
identified within this mass. Normal vascularity identified within
the ovarian parenchyma.

Pulsed Doppler evaluation of both ovaries demonstrates normal
low-resistance arterial and venous waveforms.

Other findings

No abnormal free fluid.
IMPRESSION: 1. Normal appearance of the uterus and right ovary.
2. Complex left ovarian mass favored to represent cyst with
retracted clot. Given the patient's symptoms of pain and the
appearance, followup ultrasound is recommended in 8-12 weeks to
document resolution.

## 2017-01-09 ENCOUNTER — Ambulatory Visit (INDEPENDENT_AMBULATORY_CARE_PROVIDER_SITE_OTHER): Payer: Medicaid Other | Admitting: Obstetrics and Gynecology

## 2017-01-09 ENCOUNTER — Encounter: Payer: Medicaid Other | Admitting: Obstetrics and Gynecology

## 2017-01-09 ENCOUNTER — Encounter: Payer: Self-pay | Admitting: Obstetrics and Gynecology

## 2017-01-09 VITALS — BP 100/56 | Wt 157.0 lb

## 2017-01-09 DIAGNOSIS — Z3A01 Less than 8 weeks gestation of pregnancy: Secondary | ICD-10-CM

## 2017-01-09 DIAGNOSIS — O09219 Supervision of pregnancy with history of pre-term labor, unspecified trimester: Secondary | ICD-10-CM

## 2017-01-09 DIAGNOSIS — Z315 Encounter for genetic counseling: Secondary | ICD-10-CM

## 2017-01-09 DIAGNOSIS — O09899 Supervision of other high risk pregnancies, unspecified trimester: Secondary | ICD-10-CM

## 2017-01-09 DIAGNOSIS — O09629 Supervision of young multigravida, unspecified trimester: Secondary | ICD-10-CM

## 2017-01-09 LAB — HM PAP SMEAR

## 2017-01-09 NOTE — Progress Notes (Signed)
Patient ID: Molly Bray, female   DOB: Feb 17, 1996, 21 y.o.   MRN: [id number]       New Obstetric Patient H&P    Chief Complaint: "Desires prenatal care"   History of Present Illness: Patient is a 21 y.o. [id number] Not Hispanic or Latino female, presents with amenorrhea and positive home pregnancy test. Based on her  [place] today, her EDD is Estimated Date of Delivery: 08/26/17 and her EGA is [redacted]w[redacted]d (by [redacted]w[redacted]d [place]).    She had a urine pregnancy test which was positive 2 week(s)  ago. Her last menstrual period was normal and lasted for  5 day(s). Since her LMP she claims she has experienced some nausea and fatigue. She denies vaginal bleeding. Her past medical history is noncontributory. Her prior pregnancies are notable for preterm labor and delivery at 34 weeks.  Since her LMP, she admits to the use of tobacco products  no She claims she has gained  No weight since the start of her pregnancy.  There are cats in the home in the home  yes If yes indoor and outdoor (she does not change litter box) She admits close contact with children on a regular basis  yes  She has had chicken pox in the past yes She has had Tuberculosis exposures, symptoms, or previously tested positive for TB   no Current or past history of domestic violence. no  Genetic Screening/Teratology Counseling: (Includes patient, baby's father, or anyone in either family with:)   1. Patient's age >/= 78 at [facility]  no 2. Thalassemia ([place], [place], Mediterranean, or Asian background): MCV<80  no 3. Neural tube defect (meningomyelocele, spina bifida, anencephaly)  no 4. Congenital heart defect  no  5. Down syndrome  no 6. Tay-Sachs (Jewish, [place]  no 7. Canavan's Disease  no 8. Sickle cell disease or trait (African)  no  9. Hemophilia or other blood disorders  no  10. Muscular dystrophy  no  11. Cystic fibrosis  no  12. Huntington's Chorea  no  13. Mental retardation/autism  yes 14. Other inherited genetic or  chromosomal disorder  no 15. Maternal metabolic disorder (DM, PKU, etc)  no 16. Patient or FOB with a child with a birth defect not listed above no  16a. Patient or FOB with a birth defect themselves no 17. Recurrent pregnancy loss, or stillbirth  no  18. Any medications since LMP other than prenatal vitamins (include vitamins, supplements, OTC meds, drugs, alcohol)  no 19. Any other genetic/environmental exposure to discuss  no  Infection History:   1. Lives with someone with TB or TB exposed  no  2. Patient or partner has history of genital herpes  no 3. Rash or viral illness since LMP  no 4. History of STI (GC, CT, HPV, syphilis, HIV)  no 5. History of recent travel :  no  Other pertinent information:  Yes, son with autism    Review of Systems:10 point review of systems negative unless otherwise noted in HPI  Past Medical History:  Past Medical History:  Diagnosis Date  . Pyelonephritis   . Recurrent UTI     Past Surgical History:  Past Surgical History:  Procedure Laterality Date  . KIDNEY SURGERY      Gynecologic History: Patient's last menstrual period was 10/27/2016.  Obstetric History: [id number]  Family History:  No family history on file.  Social History:  Social History   Social History  . Marital status: Legally Separated    Spouse name: N/A  .  Number of children: N/A  . Years of education: N/A   Occupational History  . Not on file.   Social History Main Topics  . Smoking status: Current Every Day Smoker    Packs/day: 0.50    Types: Cigarettes  . Smokeless tobacco: Never Used  . Alcohol use No  . Drug use: No  . Sexual activity: Yes   Other Topics Concern  . Not on file   Social History Narrative  . No narrative on file    Allergies:  PCN  Medications: Prior to Admission medications   Not on File    Physical Exam Vitals: Blood pressure (!) 100/56, weight 157 lb (71.2 kg), last menstrual period 10/27/2016.  General: NAD HEENT:  normocephalic, anicteric Thyroid: no enlargement, no palpable nodules Pulmonary: No increased work of breathing, CTAB Cardiovascular: RRR, distal pulses 2+ Abdomen: NABS, soft, non-tender, non-distended.  Umbilicus without lesions.  No hepatomegaly, splenomegaly or masses palpable. No evidence of hernia  Genitourinary:  External: Normal external female genitalia.  Normal urethral meatus, normal  Bartholin's and Skene's glands.    Vagina: Normal vaginal mucosa, no evidence of prolapse.    Cervix: Grossly normal in appearance, no bleeding  Uterus:  Non-enlarged, mobile, normal contour.  No CMT  Adnexa: ovaries non-enlarged, no adnexal masses  Rectal: deferred Extremities: no edema, erythema, or tenderness Neurologic: Grossly intact Psychiatric: mood appropriate, affect full   Assessment: 21 y.o. [id number] at 6776w2d presenting to initiate prenatal care  Plan: 1) Avoid alcoholic beverages. 2) Patient encouraged not to smoke.  3) Discontinue the use of all non-medicinal drugs and chemicals.  4) Take prenatal vitamins daily.  5) Nutrition, food safety (fish, cheese advisories, and high nitrite foods) and exercise discussed. 6) Hospital and practice style discussed with cross coverage system.  7) Genetic Screening, such as with 1st Trimester Screening, cell free fetal DNA, AFP testing, and Ultrasound, as well as with amniocentesis and CVS as appropriate, is discussed with patient. At the conclusion of today's visit patient requested genetic testing, including fragile X testing 8) Patient is asked about travel to areas at risk for the Zika virus, and counseled to avoid travel and exposure to mosquitoes or sexual partners who may have themselves been exposed to the virus. Testing is discussed, and will be ordered as appropriate.

## 2017-01-09 NOTE — Patient Instructions (Signed)
First Trimester of Pregnancy The first trimester of pregnancy is from week 1 until the end of week 13 (months 1 through 3). A week after a sperm fertilizes an egg, the egg will implant on the wall of the uterus. This embryo will begin to develop into a baby. Genes from you and your partner will form the baby. The female genes will determine whether the baby will be a boy or a girl. At 6-8 weeks, the eyes and face will be formed, and the heartbeat can be seen on ultrasound. At the end of 12 weeks, all the baby's organs will be formed. Now that you are pregnant, you will want to do everything you can to have a healthy baby. Two of the most important things are to get good prenatal care and to follow your health care provider's instructions. Prenatal care is all the medical care you receive before the baby's birth. This care will help prevent, find, and treat any problems during the pregnancy and childbirth. Body changes during your first trimester Your body goes through many changes during pregnancy. The changes vary from woman to woman.  You may gain or lose a couple of pounds at first.  You may feel sick to your stomach (nauseous) and you may throw up (vomit). If the vomiting is uncontrollable, call your health care provider.  You may tire easily.  You may develop headaches that can be relieved by medicines. All medicines should be approved by your health care provider.  You may urinate more often. Painful urination may mean you have a bladder infection.  You may develop heartburn as a result of your pregnancy.  You may develop constipation because certain hormones are causing the muscles that push stool through your intestines to slow down.  You may develop hemorrhoids or swollen veins (varicose veins).  Your breasts may begin to grow larger and become tender. Your nipples may stick out more, and the tissue that surrounds them (areola) may become darker.  Your gums may bleed and may be  sensitive to brushing and flossing.  Dark spots or blotches (chloasma, mask of pregnancy) may develop on your face. This will likely fade after the baby is born.  Your menstrual periods will stop.  You may have a loss of appetite.  You may develop cravings for certain kinds of food.  You may have changes in your emotions from day to day, such as being excited to be pregnant or being concerned that something may go wrong with the pregnancy and baby.  You may have more vivid and strange dreams.  You may have changes in your hair. These can include thickening of your hair, rapid growth, and changes in texture. Some women also have hair loss during or after pregnancy, or hair that feels dry or thin. Your hair will most likely return to normal after your baby is born.  What to expect at prenatal visits During a routine prenatal visit:  You will be weighed to make sure you and the baby are growing normally.  Your blood pressure will be taken.  Your abdomen will be measured to track your baby's growth.  The fetal heartbeat will be listened to between weeks 10 and 14 of your pregnancy.  Test results from any previous visits will be discussed.  Your health care provider may ask you:  How you are feeling.  If you are feeling the baby move.  If you have had any abnormal symptoms, such as leaking fluid, bleeding, severe headaches,   or abdominal cramping.  If you are using any tobacco products, including cigarettes, chewing tobacco, and electronic cigarettes.  If you have any questions.  Other tests that may be performed during your first trimester include:  Blood tests to find your blood type and to check for the presence of any previous infections. The tests will also be used to check for low iron levels (anemia) and protein on red blood cells (Rh antibodies). Depending on your risk factors, or if you previously had diabetes during pregnancy, you may have tests to check for high blood  sugar that affects pregnant women (gestational diabetes).  Urine tests to check for infections, diabetes, or protein in the urine.  An ultrasound to confirm the proper growth and development of the baby.  Fetal screens for spinal cord problems (spina bifida) and Down syndrome.  HIV (human immunodeficiency virus) testing. Routine prenatal testing includes screening for HIV, unless you choose not to have this test.  You may need other tests to make sure you and the baby are doing well.  Follow these instructions at home: Medicines  Follow your health care provider's instructions regarding medicine use. Specific medicines may be either safe or unsafe to take during pregnancy.  Take a prenatal vitamin that contains at least 600 micrograms (mcg) of folic acid.  If you develop constipation, try taking a stool softener if your health care provider approves. Eating and drinking  Eat a balanced diet that includes fresh fruits and vegetables, whole grains, good sources of protein such as meat, eggs, or tofu, and low-fat dairy. Your health care provider will help you determine the amount of weight gain that is right for you.  Avoid raw meat and uncooked cheese. These carry germs that can cause birth defects in the baby.  Eating four or five small meals rather than three large meals a day may help relieve nausea and vomiting. If you start to feel nauseous, eating a few soda crackers can be helpful. Drinking liquids between meals, instead of during meals, also seems to help ease nausea and vomiting.  Limit foods that are high in fat and processed sugars, such as fried and sweet foods.  To prevent constipation: ? Eat foods that are high in fiber, such as fresh fruits and vegetables, whole grains, and beans. ? Drink enough fluid to keep your urine clear or pale yellow. Activity  Exercise only as directed by your health care provider. Most women can continue their usual exercise routine during  pregnancy. Try to exercise for 30 minutes at least 5 days a week. Exercising will help you: ? Control your weight. ? Stay in shape. ? Be prepared for labor and delivery.  Experiencing pain or cramping in the lower abdomen or lower back is a good sign that you should stop exercising. Check with your health care provider before continuing with normal exercises.  Try to avoid standing for long periods of time. Move your legs often if you must stand in one place for a long time.  Avoid heavy lifting.  Wear low-heeled shoes and practice good posture.  You may continue to have sex unless your health care provider tells you not to. Relieving pain and discomfort  Wear a good support bra to relieve breast tenderness.  Take warm sitz baths to soothe any pain or discomfort caused by hemorrhoids. Use hemorrhoid cream if your health care provider approves.  Rest with your legs elevated if you have leg cramps or low back pain.  If you develop   varicose veins in your legs, wear support hose. Elevate your feet for 15 minutes, 3-4 times a day. Limit salt in your diet. Prenatal care  Schedule your prenatal visits by the twelfth week of pregnancy. They are usually scheduled monthly at first, then more often in the last 2 months before delivery.  Write down your questions. Take them to your prenatal visits.  Keep all your prenatal visits as told by your health care provider. This is important. Safety  Wear your seat belt at all times when driving.  Make a list of emergency phone numbers, including numbers for family, friends, the hospital, and police and fire departments. General instructions  Ask your health care provider for a referral to a local prenatal education class. Begin classes no later than the beginning of month 6 of your pregnancy.  Ask for help if you have counseling or nutritional needs during pregnancy. Your health care provider can offer advice or refer you to specialists for help  with various needs.  Do not use hot tubs, steam rooms, or saunas.  Do not douche or use tampons or scented sanitary pads.  Do not cross your legs for long periods of time.  Avoid cat litter boxes and soil used by cats. These carry germs that can cause birth defects in the baby and possibly loss of the fetus by miscarriage or stillbirth.  Avoid all smoking, herbs, alcohol, and medicines not prescribed by your health care provider. Chemicals in these products affect the formation and growth of the baby.  Do not use any products that contain nicotine or tobacco, such as cigarettes and e-cigarettes. If you need help quitting, ask your health care provider. You may receive counseling support and other resources to help you quit.  Schedule a dentist appointment. At home, brush your teeth with a soft toothbrush and be gentle when you floss. Contact a health care provider if:  You have dizziness.  You have mild pelvic cramps, pelvic pressure, or nagging pain in the abdominal area.  You have persistent nausea, vomiting, or diarrhea.  You have a bad smelling vaginal discharge.  You have pain when you urinate.  You notice increased swelling in your face, hands, legs, or ankles.  You are exposed to fifth disease or chickenpox.  You are exposed to German measles (rubella) and have never had it. Get help right away if:  You have a fever.  You are leaking fluid from your vagina.  You have spotting or bleeding from your vagina.  You have severe abdominal cramping or pain.  You have rapid weight gain or loss.  You vomit blood or material that looks like coffee grounds.  You develop a severe headache.  You have shortness of breath.  You have any kind of trauma, such as from a fall or a car accident. Summary  The first trimester of pregnancy is from week 1 until the end of week 13 (months 1 through 3).  Your body goes through many changes during pregnancy. The changes vary from  woman to woman.  You will have routine prenatal visits. During those visits, your health care provider will examine you, discuss any test results you may have, and talk with you about how you are feeling. This information is not intended to replace advice given to you by your health care provider. Make sure you discuss any questions you have with your health care provider. Document Released: 06/26/2001 Document Revised: 06/13/2016 Document Reviewed: 06/13/2016 Elsevier Interactive Patient Education  2017 Elsevier   Inc.  

## 2017-01-09 NOTE — Progress Notes (Signed)
NOB/ dating U/S 6/26

## 2017-01-10 LAB — RPR+RH+ABO+RUB AB+AB SCR+CB...
Antibody Screen: NEGATIVE
HEMATOCRIT: 41.6 % (ref 34.0–46.6)
HIV Screen 4th Generation wRfx: NONREACTIVE
Hemoglobin: 13.9 g/dL (ref 11.1–15.9)
Hepatitis B Surface Ag: NEGATIVE
MCH: 30.3 pg (ref 26.6–33.0)
MCHC: 33.4 g/dL (ref 31.5–35.7)
MCV: 91 fL (ref 79–97)
Platelets: 289 10*3/uL (ref 150–379)
RBC: 4.59 x10E6/uL (ref 3.77–5.28)
RDW: 13.1 % (ref 12.3–15.4)
RH TYPE: POSITIVE
RPR Ser Ql: NONREACTIVE
Rubella Antibodies, IGG: 1.24 index (ref >0.99)
Varicella zoster IgG: 1849 index (ref >165)
WBC: 9.9 10*3/uL (ref 3.4–10.8)

## 2017-01-11 LAB — URINE CULTURE

## 2017-01-14 LAB — PAP LB, CT-NG, RFX HPV ASCU
CHLAMYDIA, NUC. ACID AMP: NEGATIVE
GONOCOCCUS, NUC. ACID AMP: NEGATIVE
PAP SMEAR COMMENT: 0

## 2017-01-23 ENCOUNTER — Encounter: Payer: Medicaid Other | Admitting: Obstetrics & Gynecology

## 2017-01-23 ENCOUNTER — Emergency Department
Admission: EM | Admit: 2017-01-23 | Discharge: 2017-01-23 | Disposition: A | Discharge status: Home / Self Care | Payer: Medicaid Other | Attending: Emergency Medicine | Admitting: Emergency Medicine

## 2017-01-23 DIAGNOSIS — O99330 Smoking (tobacco) complicating pregnancy, unspecified trimester: Secondary | ICD-10-CM | POA: Insufficient documentation

## 2017-01-23 DIAGNOSIS — O211 Hyperemesis gravidarum with metabolic disturbance: Secondary | ICD-10-CM | POA: Diagnosis not present

## 2017-01-23 DIAGNOSIS — O210 Mild hyperemesis gravidarum: Secondary | ICD-10-CM

## 2017-01-23 DIAGNOSIS — N390 Urinary tract infection, site not specified: Secondary | ICD-10-CM | POA: Diagnosis not present

## 2017-01-23 DIAGNOSIS — R112 Nausea with vomiting, unspecified: Secondary | ICD-10-CM | POA: Diagnosis present

## 2017-01-23 DIAGNOSIS — F17210 Nicotine dependence, cigarettes, uncomplicated: Secondary | ICD-10-CM | POA: Insufficient documentation

## 2017-01-23 DIAGNOSIS — O219 Vomiting of pregnancy, unspecified: Secondary | ICD-10-CM | POA: Insufficient documentation

## 2017-01-23 DIAGNOSIS — Z3A08 8 weeks gestation of pregnancy: Secondary | ICD-10-CM | POA: Diagnosis not present

## 2017-01-23 LAB — URINALYSIS, COMPLETE (UACMP) WITH MICROSCOPIC
BILIRUBIN URINE: NEGATIVE
Bacteria, UA: NONE SEEN
GLUCOSE, UA: NEGATIVE mg/dL
Hgb urine dipstick: NEGATIVE
KETONES UR: 5 mg/dL — AB
Nitrite: NEGATIVE
PH: 6 (ref 5.0–8.0)
Protein, ur: 30 mg/dL — AB
Specific Gravity, Urine: 1.027 (ref 1.005–1.030)

## 2017-01-23 LAB — COMPREHENSIVE METABOLIC PANEL
ALK PHOS: 53 U/L (ref 38–126)
ALT: 26 U/L (ref 14–54)
ANION GAP: 8 (ref 5–15)
AST: 20 U/L (ref 15–41)
Albumin: 4.2 g/dL (ref 3.5–5.0)
BILIRUBIN TOTAL: 0.4 mg/dL (ref 0.3–1.2)
BUN: 11 mg/dL (ref 6–20)
CO2: 24 mmol/L (ref 22–32)
Calcium: 9.9 mg/dL (ref 8.9–10.3)
Chloride: 106 mmol/L (ref 101–111)
Creatinine, Ser: 0.53 mg/dL (ref 0.44–1.00)
GFR calc Af Amer: >60 mL/min (ref >60)
GFR calc non Af Amer: >60 mL/min (ref >60)
GLUCOSE: 103 mg/dL — AB (ref 65–99)
POTASSIUM: 3.8 mmol/L (ref 3.5–5.1)
SODIUM: 138 mmol/L (ref 135–145)
TOTAL PROTEIN: 7.3 g/dL (ref 6.5–8.1)

## 2017-01-23 LAB — CBC
HCT: 38.7 % (ref 35.0–47.0)
HEMOGLOBIN: 13.6 g/dL (ref 12.0–16.0)
MCH: 31.2 pg (ref 26.0–34.0)
MCHC: 35.3 g/dL (ref 32.0–36.0)
MCV: 88.5 fL (ref 80.0–100.0)
PLATELETS: 260 10*3/uL (ref 150–440)
RBC: 4.37 MIL/uL (ref 3.80–5.20)
RDW: 12.8 % (ref 11.5–14.5)
WBC: 11.4 10*3/uL — ABNORMAL HIGH (ref 3.6–11.0)

## 2017-01-23 LAB — LIPASE, BLOOD: Lipase: 29 U/L (ref 11–51)

## 2017-01-23 MED ORDER — DOXYLAMINE SUCCINATE (SLEEP) 25 MG PO TABS: 12.5000 mg | ORAL_TABLET | Freq: Every evening | ORAL | 0 refills | Status: DC | PRN

## 2017-01-23 MED ORDER — NITROFURANTOIN MONOHYD MACRO 100 MG PO CAPS
100.0000 mg | ORAL_CAPSULE | Freq: Once | ORAL | Status: AC
  Administered 2017-01-23: 100 mg via ORAL
  Filled 2017-01-23: qty 1

## 2017-01-23 MED ORDER — NITROFURANTOIN MONOHYD MACRO 100 MG PO CAPS: 100.0000 mg | ORAL_CAPSULE | Freq: Two times a day (BID) | ORAL | 0 refills | Status: AC

## 2017-01-23 MED ORDER — VITAMIN B-6 25 MG PO TABS: 25.0000 mg | ORAL_TABLET | Freq: Every evening | ORAL | 0 refills | Status: DC | PRN

## 2017-01-23 MED ORDER — SODIUM CHLORIDE 0.9 % IV BOLUS (SEPSIS)
1000.0000 mL | Freq: Once | INTRAVENOUS | Status: AC
  Administered 2017-01-23: 1000 mL via INTRAVENOUS

## 2017-01-23 NOTE — ED Triage Notes (Signed)
Pt reports that she is [redacted] weeks pregnant and that she gets sick when trying to eat or drink anything.

## 2017-01-23 NOTE — ED Notes (Signed)
Patient states she called her OB prior to coming to the ED and was told she would be able to go straight up to labor and delivery.  Patient states, "I have a high risk pregnancy and I'm here with my 21 year old, I need to go upstairs."  This RN called Labor and Delivery to make sure they would not take patient prior to ED eval and they stated they could not.  This RN explained the situation to the patient.

## 2017-01-23 NOTE — ED Provider Notes (Signed)
[facility] Emergency Department Provider Note  ____________________________________________   First MD Initiated Contact with Patient 01/23/17 1513     (approximate)  I have reviewed the triage vital signs and the nursing notes.   HISTORY  Chief Complaint Morning Sickness   HPI Lener Ventresca is a 21 y.o. female who is of [redacted] weeks gestation with a known IUP was presented to emergency department today with multiple episodes of nausea and vomiting and lower abdominal cramping over the past 3 days. She says that she is only dry heaving at this time she has vomited so much. Denies any green or bloody vomitus. Has not been taking any antinausea medicine at home.Denies any bleeding or discharge from the vagina.   Past Medical History:  Diagnosis Date  . Pyelonephritis   . Recurrent UTI     Patient Active Problem List   Diagnosis Date Noted  . High-risk pregnancy, young multigravida 01/09/2017  . History of preterm delivery, currently pregnant 01/09/2017    Past Surgical History:  Procedure Laterality Date  . KIDNEY SURGERY      Prior to Admission medications   Not on File    Allergies Amoxicillin and Penicillins  No family history on file.  Social History Social History  Substance Use Topics  . Smoking status: Current Every Day Smoker    Packs/day: 0.50    Types: Cigarettes  . Smokeless tobacco: Never Used  . Alcohol use No    Review of Systems  Constitutional: No fever/chills Eyes: No visual changes. ENT: No sore throat. Cardiovascular: Denies chest pain. Respiratory: Denies shortness of breath. Gastrointestinal:   No diarrhea.  No constipation. Genitourinary: Negative for dysuria. Musculoskeletal: Negative for back pain. Skin: Negative for rash. Neurological: Negative for headaches, focal weakness or numbness.   ____________________________________________   PHYSICAL EXAM:  VITAL SIGNS: ED Triage Vitals  Enc  Vitals Group     BP 01/23/17 1457 (!) 89/58     Pulse Rate 01/23/17 1457 (!) 124     Resp 01/23/17 1455 18     Temp 01/23/17 1455 98.6 F (37 C)     Temp Source 01/23/17 1455 Oral     SpO2 01/23/17 1455 100 %     Weight 01/23/17 1455 155 lb (70.3 kg)     Height 01/23/17 1455 5\' 5"  (1.651 m)     Head Circumference --      Peak Flow --      Pain Score --      Pain Loc --      Pain Edu? --      Excl. in GC? --     Constitutional: Alert and oriented. Well appearing and in no acute distress. Eyes: Conjunctivae are normal.  Head: Atraumatic. Nose: No congestion/rhinnorhea. Mouth/Throat: Mucous membranes are moist.  Neck: No stridor.   Cardiovascular: Normal rate, regular rhythm. Grossly normal heart sounds.  Good peripheral circulation. Respiratory: Normal respiratory effort.  No retractions. Lungs CTAB. Gastrointestinal: Soft With mild suprapubic tenderness to palpation. No distention. No CVA tenderness. Musculoskeletal: No lower extremity tenderness nor edema.  No joint effusions. Neurologic:  Normal speech and language. No gross focal neurologic deficits are appreciated. Skin:  Skin is warm, dry and intact. No rash noted. Psychiatric: Mood and affect are normal. Speech and behavior are normal.  ____________________________________________   LABS (all labs ordered are listed, but only abnormal results are displayed)  Labs Reviewed  COMPREHENSIVE METABOLIC PANEL - Abnormal; Notable for the following:  Result Value   Glucose, Bld 103 (*)    All other components within normal limits  CBC - Abnormal; Notable for the following:    WBC 11.4 (*)    All other components within normal limits  URINALYSIS, COMPLETE (UACMP) WITH MICROSCOPIC - Abnormal; Notable for the following:    Color, Urine AMBER (*)    APPearance HAZY (*)    Ketones, ur 5 (*)    Protein, ur 30 (*)    Leukocytes, UA MODERATE (*)    Squamous Epithelial / LPF 6-30 (*)    All other components within normal  limits  LIPASE, BLOOD   ____________________________________________  EKG   ____________________________________________  RADIOLOGY   ____________________________________________   PROCEDURES  Procedure(s) performed:   Procedures  Critical Care performed:   ____________________________________________   INITIAL IMPRESSION / ASSESSMENT AND PLAN / ED COURSE  Pertinent labs & imaging results that were available during my care of the patient were reviewed by me and considered in my medical decision making (see chart for details).  ----------------------------------------- 4:25 PM on 01/23/2017 -----------------------------------------  Reassuring lab work. Patient at this time is able tolerate by mouth fluids as well as crackers after fluids through the IV. She'll be discharged with instructions for a half tab of doxylamine as well as a 25 mg tab of B6 vitamin. Patient is understanding when to comply. Will be followed up in [facility].      ____________________________________________   FINAL CLINICAL IMPRESSION(S) / ED DIAGNOSES  UTI. Hyperemesis gravidarum.    NEW MEDICATIONS STARTED DURING THIS VISIT:  New Prescriptions   No medications on file     Note:  This document was prepared using Dragon voice recognition software and may include unintentional dictation errors.     Myrna BlazerSchaevitz, Stevon Gough Matthew, MD 01/23/17 [phone]

## 2017-01-23 NOTE — ED Notes (Signed)
Patient presents to the ED with nausea and vomiting x 4 days.  Patient reports abdominal pain, cramping, and hunger.  Patient states she can drink some fluids but can't eat food.  Patient reports she is approx. [redacted] weeks pregnant.  Patient states she has a high risk pregnancy due to pre-mature labor and contractions with 1st pregnancy.

## 2017-01-30 ENCOUNTER — Encounter: Payer: Medicaid Other | Admitting: Certified Nurse Midwife

## 2017-01-30 ENCOUNTER — Telehealth: Payer: Self-pay

## 2017-01-30 NOTE — Telephone Encounter (Signed)
Pt requested an appt for possible pre-eclampsia symptoms. Called to discuss with pt as she is only 9 wks. Pt c/o legs feeling heavy and cramping but unsure if they are swollen. Pt was seen in ER on 01/23/17 for hyperemesis gravidum and BP was low. Advised pt that even though she is high risk due to hx of pre-term delivery pre-eclampsia does not occur until later in pregnancy. Encouraged fluids, hydration and rest. Pt to call if sxs worsen or persist.

## 2017-02-06 ENCOUNTER — Ambulatory Visit (INDEPENDENT_AMBULATORY_CARE_PROVIDER_SITE_OTHER): Payer: Medicaid Other | Admitting: Obstetrics & Gynecology

## 2017-02-06 VITALS — BP 100/60 | Wt 147.0 lb

## 2017-02-06 DIAGNOSIS — R109 Unspecified abdominal pain: Secondary | ICD-10-CM | POA: Diagnosis not present

## 2017-02-06 DIAGNOSIS — O26899 Other specified pregnancy related conditions, unspecified trimester: Secondary | ICD-10-CM | POA: Diagnosis not present

## 2017-02-06 DIAGNOSIS — Z3A10 10 weeks gestation of pregnancy: Secondary | ICD-10-CM

## 2017-02-06 DIAGNOSIS — O09899 Supervision of other high risk pregnancies, unspecified trimester: Secondary | ICD-10-CM

## 2017-02-06 DIAGNOSIS — O09629 Supervision of young multigravida, unspecified trimester: Secondary | ICD-10-CM

## 2017-02-06 DIAGNOSIS — O09219 Supervision of pregnancy with history of pre-term labor, unspecified trimester: Secondary | ICD-10-CM

## 2017-02-06 LAB — POCT URINALYSIS DIPSTICK
Bilirubin, UA: NEGATIVE
Blood, UA: NEGATIVE
GLUCOSE UA: NEGATIVE
KETONES UA: NEGATIVE
Leukocytes, UA: NEGATIVE
Nitrite, UA: NEGATIVE
Protein, UA: NEGATIVE
SPEC GRAV UA: 1.01 (ref 1.010–1.025)
Urobilinogen, UA: 0.2 E.U./dL
pH, UA: 5 (ref 5.0–8.0)

## 2017-02-06 MED ORDER — PROMETHAZINE HCL 25 MG PO TABS: 25.0000 mg | ORAL_TABLET | Freq: Four times a day (QID) | ORAL | 2 refills | Status: DC | PRN

## 2017-02-06 NOTE — Progress Notes (Addendum)
PNV, Phenergan, US Monday Cramping, good FHTs today Progesterone planned for 16 weeks UA neg today Results for orders placed or performed in visit on 02/06/17  POCT urinalysis dipstick  Result Value Ref Range   Color, UA yellow    Clarity, UA clear    Glucose, UA neg    Bilirubin, UA neg    Ketones, UA neg    Spec Grav, UA 1.010 1.010 - 1.025   Blood, UA neg    pH, UA 5.0 5.0 - 8.0   Protein, UA neg    Urobilinogen, UA 0.2 0.2 or 1.0 E.U./dL   Nitrite, UA neg    Leukocytes, UA Negative Negative

## 2017-02-06 NOTE — Patient Instructions (Signed)
First Trimester of Pregnancy The first trimester of pregnancy is from week 1 until the end of week 13 (months 1 through 3). A week after a sperm fertilizes an egg, the egg will implant on the wall of the uterus. This embryo will begin to develop into a baby. Genes from you and your partner will form the baby. The female genes will determine whether the baby will be a boy or a girl. At 6-8 weeks, the eyes and face will be formed, and the heartbeat can be seen on ultrasound. At the end of 12 weeks, all the baby's organs will be formed. Now that you are pregnant, you will want to do everything you can to have a healthy baby. Two of the most important things are to get good prenatal care and to follow your health care provider's instructions. Prenatal care is all the medical care you receive before the baby's birth. This care will help prevent, find, and treat any problems during the pregnancy and childbirth. Body changes during your first trimester Your body goes through many changes during pregnancy. The changes vary from woman to woman.  You may gain or lose a couple of pounds at first.  You may feel sick to your stomach (nauseous) and you may throw up (vomit). If the vomiting is uncontrollable, call your health care provider.  You may tire easily.  You may develop headaches that can be relieved by medicines. All medicines should be approved by your health care provider.  You may urinate more often. Painful urination may mean you have a bladder infection.  You may develop heartburn as a result of your pregnancy.  You may develop constipation because certain hormones are causing the muscles that push stool through your intestines to slow down.  You may develop hemorrhoids or swollen veins (varicose veins).  Your breasts may begin to grow larger and become tender. Your nipples may stick out more, and the tissue that surrounds them (areola) may become darker.  Your gums may bleed and may be  sensitive to brushing and flossing.  Dark spots or blotches (chloasma, mask of pregnancy) may develop on your face. This will likely fade after the baby is born.  Your menstrual periods will stop.  You may have a loss of appetite.  You may develop cravings for certain kinds of food.  You may have changes in your emotions from day to day, such as being excited to be pregnant or being concerned that something may go wrong with the pregnancy and baby.  You may have more vivid and strange dreams.  You may have changes in your hair. These can include thickening of your hair, rapid growth, and changes in texture. Some women also have hair loss during or after pregnancy, or hair that feels dry or thin. Your hair will most likely return to normal after your baby is born.  What to expect at prenatal visits During a routine prenatal visit:  You will be weighed to make sure you and the baby are growing normally.  Your blood pressure will be taken.  Your abdomen will be measured to track your baby's growth.  The fetal heartbeat will be listened to between weeks 10 and 14 of your pregnancy.  Test results from any previous visits will be discussed.  Your health care provider may ask you:  How you are feeling.  If you are feeling the baby move.  If you have had any abnormal symptoms, such as leaking fluid, bleeding, severe headaches,   or abdominal cramping.  If you are using any tobacco products, including cigarettes, chewing tobacco, and electronic cigarettes.  If you have any questions.  Other tests that may be performed during your first trimester include:  Blood tests to find your blood type and to check for the presence of any previous infections. The tests will also be used to check for low iron levels (anemia) and protein on red blood cells (Rh antibodies). Depending on your risk factors, or if you previously had diabetes during pregnancy, you may have tests to check for high blood  sugar that affects pregnant women (gestational diabetes).  Urine tests to check for infections, diabetes, or protein in the urine.  An ultrasound to confirm the proper growth and development of the baby.  Fetal screens for spinal cord problems (spina bifida) and Down syndrome.  HIV (human immunodeficiency virus) testing. Routine prenatal testing includes screening for HIV, unless you choose not to have this test.  You may need other tests to make sure you and the baby are doing well.  Follow these instructions at home: Medicines  Follow your health care provider's instructions regarding medicine use. Specific medicines may be either safe or unsafe to take during pregnancy.  Take a prenatal vitamin that contains at least 600 micrograms (mcg) of folic acid.  If you develop constipation, try taking a stool softener if your health care provider approves. Eating and drinking  Eat a balanced diet that includes fresh fruits and vegetables, whole grains, good sources of protein such as meat, eggs, or tofu, and low-fat dairy. Your health care provider will help you determine the amount of weight gain that is right for you.  Avoid raw meat and uncooked cheese. These carry germs that can cause birth defects in the baby.  Eating four or five small meals rather than three large meals a day may help relieve nausea and vomiting. If you start to feel nauseous, eating a few soda crackers can be helpful. Drinking liquids between meals, instead of during meals, also seems to help ease nausea and vomiting.  Limit foods that are high in fat and processed sugars, such as fried and sweet foods.  To prevent constipation: ? Eat foods that are high in fiber, such as fresh fruits and vegetables, whole grains, and beans. ? Drink enough fluid to keep your urine clear or pale yellow. Activity  Exercise only as directed by your health care provider. Most women can continue their usual exercise routine during  pregnancy. Try to exercise for 30 minutes at least 5 days a week. Exercising will help you: ? Control your weight. ? Stay in shape. ? Be prepared for labor and delivery.  Experiencing pain or cramping in the lower abdomen or lower back is a good sign that you should stop exercising. Check with your health care provider before continuing with normal exercises.  Try to avoid standing for long periods of time. Move your legs often if you must stand in one place for a long time.  Avoid heavy lifting.  Wear low-heeled shoes and practice good posture.  You may continue to have sex unless your health care provider tells you not to. Relieving pain and discomfort  Wear a good support bra to relieve breast tenderness.  Take warm sitz baths to soothe any pain or discomfort caused by hemorrhoids. Use hemorrhoid cream if your health care provider approves.  Rest with your legs elevated if you have leg cramps or low back pain.  If you develop   varicose veins in your legs, wear support hose. Elevate your feet for 15 minutes, 3-4 times a day. Limit salt in your diet. Prenatal care  Schedule your prenatal visits by the twelfth week of pregnancy. They are usually scheduled monthly at first, then more often in the last 2 months before delivery.  Write down your questions. Take them to your prenatal visits.  Keep all your prenatal visits as told by your health care provider. This is important. Safety  Wear your seat belt at all times when driving.  Make a list of emergency phone numbers, including numbers for family, friends, the hospital, and police and fire departments. General instructions  Ask your health care provider for a referral to a local prenatal education class. Begin classes no later than the beginning of month 6 of your pregnancy.  Ask for help if you have counseling or nutritional needs during pregnancy. Your health care provider can offer advice or refer you to specialists for help  with various needs.  Do not use hot tubs, steam rooms, or saunas.  Do not douche or use tampons or scented sanitary pads.  Do not cross your legs for long periods of time.  Avoid cat litter boxes and soil used by cats. These carry germs that can cause birth defects in the baby and possibly loss of the fetus by miscarriage or stillbirth.  Avoid all smoking, herbs, alcohol, and medicines not prescribed by your health care provider. Chemicals in these products affect the formation and growth of the baby.  Do not use any products that contain nicotine or tobacco, such as cigarettes and e-cigarettes. If you need help quitting, ask your health care provider. You may receive counseling support and other resources to help you quit.  Schedule a dentist appointment. At home, brush your teeth with a soft toothbrush and be gentle when you floss. Contact a health care provider if:  You have dizziness.  You have mild pelvic cramps, pelvic pressure, or nagging pain in the abdominal area.  You have persistent nausea, vomiting, or diarrhea.  You have a bad smelling vaginal discharge.  You have pain when you urinate.  You notice increased swelling in your face, hands, legs, or ankles.  You are exposed to fifth disease or chickenpox.  You are exposed to German measles (rubella) and have never had it. Get help right away if:  You have a fever.  You are leaking fluid from your vagina.  You have spotting or bleeding from your vagina.  You have severe abdominal cramping or pain.  You have rapid weight gain or loss.  You vomit blood or material that looks like coffee grounds.  You develop a severe headache.  You have shortness of breath.  You have any kind of trauma, such as from a fall or a car accident. Summary  The first trimester of pregnancy is from week 1 until the end of week 13 (months 1 through 3).  Your body goes through many changes during pregnancy. The changes vary from  woman to woman.  You will have routine prenatal visits. During those visits, your health care provider will examine you, discuss any test results you may have, and talk with you about how you are feeling. This information is not intended to replace advice given to you by your health care provider. Make sure you discuss any questions you have with your health care provider. Document Released: 06/26/2001 Document Revised: 06/13/2016 Document Reviewed: 06/13/2016 Elsevier Interactive Patient Education  2017 Elsevier   Inc.  

## 2017-02-11 ENCOUNTER — Encounter: Payer: Medicaid Other | Admitting: Obstetrics and Gynecology

## 2017-02-11 ENCOUNTER — Telehealth: Payer: Self-pay

## 2017-02-11 ENCOUNTER — Other Ambulatory Visit: Payer: Medicaid Other

## 2017-02-11 ENCOUNTER — Ambulatory Visit (INDEPENDENT_AMBULATORY_CARE_PROVIDER_SITE_OTHER): Payer: Medicaid Other | Admitting: Obstetrics and Gynecology

## 2017-02-11 ENCOUNTER — Other Ambulatory Visit: Payer: Self-pay | Admitting: Obstetrics and Gynecology

## 2017-02-11 ENCOUNTER — Ambulatory Visit (INDEPENDENT_AMBULATORY_CARE_PROVIDER_SITE_OTHER): Payer: Medicaid Other

## 2017-02-11 VITALS — BP 102/58 | Wt 147.0 lb

## 2017-02-11 DIAGNOSIS — Z315 Encounter for genetic counseling: Secondary | ICD-10-CM

## 2017-02-11 DIAGNOSIS — O09899 Supervision of other high risk pregnancies, unspecified trimester: Secondary | ICD-10-CM

## 2017-02-11 DIAGNOSIS — Z36 Encounter for antenatal screening for chromosomal anomalies: Secondary | ICD-10-CM | POA: Diagnosis not present

## 2017-02-11 DIAGNOSIS — Z3A11 11 weeks gestation of pregnancy: Secondary | ICD-10-CM

## 2017-02-11 DIAGNOSIS — O09219 Supervision of pregnancy with history of pre-term labor, unspecified trimester: Secondary | ICD-10-CM

## 2017-02-11 DIAGNOSIS — O09629 Supervision of young multigravida, unspecified trimester: Secondary | ICD-10-CM

## 2017-02-11 MED ORDER — HYDROXYPROGESTERONE CAPROATE 250 MG/ML IM OIL: 250.0000 mg | TOPICAL_OIL | Freq: Once | INTRAMUSCULAR | 4 refills | Status: DC

## 2017-02-11 MED ORDER — HYDROXYPROGESTERONE CAPROATE 275 MG/1.1ML ~~LOC~~ SOAJ: 1.1000 mL | SUBCUTANEOUS | 5 refills | Status: DC

## 2017-02-11 NOTE — Progress Notes (Signed)
NT today.

## 2017-02-11 NOTE — Telephone Encounter (Signed)
Please advise 

## 2017-02-11 NOTE — Progress Notes (Signed)
Makena ordered today, normal 1st trimester screen - blood work drawn today

## 2017-02-11 NOTE — Telephone Encounter (Signed)
Per Jae DireKate w/[facility]. Makena Auto Injector is not covered under [place] Medicaid. Please resend rx for Regular Makena IM injection.

## 2017-02-12 ENCOUNTER — Encounter: Payer: Medicaid Other | Admitting: Certified Nurse Midwife

## 2017-02-12 ENCOUNTER — Other Ambulatory Visit: Payer: Self-pay | Admitting: Obstetrics and Gynecology

## 2017-02-12 MED ORDER — HYDROXYPROGESTERONE CAPROATE 250 MG/ML IM OIL: 250.0000 mg | TOPICAL_OIL | Freq: Once | INTRAMUSCULAR | 4 refills | Status: DC

## 2017-02-12 NOTE — Telephone Encounter (Signed)
I switched this yesterday

## 2017-02-12 NOTE — Telephone Encounter (Signed)
[facility]Cruzita Lederer.Makena injection NOT AUTO injector

## 2017-02-14 ENCOUNTER — Telehealth: Payer: Self-pay

## 2017-02-14 NOTE — Telephone Encounter (Signed)
[facility] calling for [facility] information for First Trim. Screen:  CRL, NT, and if pt is insulin dependent.  Specimen # [id number]   Information given including if nasal bone present.  Report to be sent in 10-15min.

## 2017-02-15 ENCOUNTER — Encounter: Payer: Self-pay | Admitting: Obstetrics and Gynecology

## 2017-02-15 LAB — FIRST TRIMESTER SCREEN W/NT
CRL: 56.4 mm
DIA MOM: 0.36
DIA Value: 95.7 pg/mL
GEST AGE-COLLECT: 12.1 wk
Maternal Age At EDD: 22.9 yr
NUCHAL TRANSLUCENCY MOM: 0.61
NUCHAL TRANSLUCENCY: 0.9 mm
NUMBER OF FETUSES: 1
PAPP-A MoM: 0.17
PAPP-A Value: 142.8 ng/mL
TEST RESULTS: NEGATIVE
WEIGHT: 147 [lb_av]
hCG MoM: 0.68
hCG Value: 65 IU/mL

## 2017-02-27 ENCOUNTER — Encounter: Payer: Self-pay | Admitting: Obstetrics and Gynecology

## 2017-03-08 ENCOUNTER — Emergency Department
Admission: EM | Admit: 2017-03-08 | Discharge: 2017-03-08 | Disposition: A | Discharge status: Home / Self Care | Payer: Medicaid Other | Attending: Emergency Medicine | Admitting: Emergency Medicine

## 2017-03-08 ENCOUNTER — Encounter: Payer: Self-pay | Admitting: Emergency Medicine

## 2017-03-08 DIAGNOSIS — Z3A14 14 weeks gestation of pregnancy: Secondary | ICD-10-CM | POA: Diagnosis not present

## 2017-03-08 DIAGNOSIS — Z79899 Other long term (current) drug therapy: Secondary | ICD-10-CM | POA: Insufficient documentation

## 2017-03-08 DIAGNOSIS — O09622 Supervision of young multigravida, second trimester: Secondary | ICD-10-CM | POA: Insufficient documentation

## 2017-03-08 DIAGNOSIS — O26812 Pregnancy related exhaustion and fatigue, second trimester: Secondary | ICD-10-CM | POA: Insufficient documentation

## 2017-03-08 DIAGNOSIS — R55 Syncope and collapse: Secondary | ICD-10-CM | POA: Diagnosis not present

## 2017-03-08 LAB — CBC
HCT: 35.4 % (ref 35.0–47.0)
HEMOGLOBIN: 12.4 g/dL (ref 12.0–16.0)
MCH: 31.2 pg (ref 26.0–34.0)
MCHC: 35 g/dL (ref 32.0–36.0)
MCV: 89.2 fL (ref 80.0–100.0)
PLATELETS: 240 10*3/uL (ref 150–440)
RBC: 3.97 MIL/uL (ref 3.80–5.20)
RDW: 13.2 % (ref 11.5–14.5)
WBC: 11.2 10*3/uL — ABNORMAL HIGH (ref 3.6–11.0)

## 2017-03-08 LAB — BASIC METABOLIC PANEL
ANION GAP: 6 (ref 5–15)
BUN: 8 mg/dL (ref 6–20)
CALCIUM: 8.9 mg/dL (ref 8.9–10.3)
CO2: 23 mmol/L (ref 22–32)
CREATININE: 0.44 mg/dL (ref 0.44–1.00)
Chloride: 107 mmol/L (ref 101–111)
Glucose, Bld: 92 mg/dL (ref 65–99)
Potassium: 3.6 mmol/L (ref 3.5–5.1)
SODIUM: 136 mmol/L (ref 135–145)

## 2017-03-08 MED ORDER — ACETAMINOPHEN 325 MG PO TABS
650.0000 mg | ORAL_TABLET | Freq: Once | ORAL | Status: AC
  Administered 2017-03-08: 650 mg via ORAL
  Filled 2017-03-08: qty 2

## 2017-03-08 NOTE — ED Notes (Signed)
Dr. York Cerise in room with Korea machine to assess fetal heart tones.

## 2017-03-08 NOTE — ED Triage Notes (Signed)
[place] pt states that she was standing in line and felt like everything was going black, pt states that she passed out and onlookers reported that she hit the back of her head as well as the front of her head. Pt reports left sided head pain at this time and that she is [redacted] weeks pregnant. Pt denies feeling bad prior to passing out and is attributing her syncope from walking in the heat out in the parking lot, c-collar in place

## 2017-03-08 NOTE — ED Notes (Signed)
Pt given cup of apple juice and ice. Pt ambulated to bathroom unassisted. Pt states she did not have any vaginal bleeding while ambulating.

## 2017-03-08 NOTE — ED Provider Notes (Signed)
[facility] Emergency Department Provider Note  ____________________________________________   First MD Initiated Contact with Patient 03/08/17 1510     (approximate)  I have reviewed the triage vital signs and the nursing notes.   HISTORY  Chief Complaint Loss of Consciousness    HPI Molly Bray is a 21 y.o. female G2 P1 at approximately 74 weeksation who presents by EMS for evaluation of syncope and collapse.  she reports that she was feeling a little bit "under the weather" this morning but without any specific symptoms, just a little bit of general malaise.  She went to the Court house to contest a ticket and was standing in line with does not people in the heat.  She started to feel overheated, sweaty, flushed,and started to feel lightheaded as her vision began to get dark.  She tried to sit down but was unable to do so in time and passed out, striking the left side of her head on the concrete.  She woke up immediately and did not have any witnessed seizure-like activity.  She was not confused upon waking up.  She has a moderate headache that is generalized but centered more or less on her contusion.  She denies neck pain and back pain.  She denies chest pain, shortness of breath, vomiting, abdominal pain.  She has not seen any vaginal bleeding since the incident.  She feels back to normal except for her headache although she commented that her blood pressure was reportedly low according to EMS on the scene.she reports that she had a prior episode of passing out under similar circumstances with her first pregnancy.    Nothing in particular makes the patient's symptoms better nor worse.   the onset of this episode was acute, it was severe, and resolved on its own.   Past Medical History:  Diagnosis Date  . Pyelonephritis   . Recurrent UTI     Patient Active Problem List   Diagnosis Date Noted  . High-risk pregnancy, young multigravida 01/09/2017    . History of preterm delivery, currently pregnant 01/09/2017    Past Surgical History:  Procedure Laterality Date  . KIDNEY SURGERY      Prior to Admission medications   Medication Sig Start Date End Date Taking? Authorizing Provider  doxylamine, Sleep, (UNISOM) 25 MG tablet Take 0.5 tablets (12.5 mg total) by mouth at bedtime as needed (Nausea/vomiting). 01/23/17  Yes Schaevitz, Myra Rude, MD  promethazine (PHENERGAN) 25 MG tablet Take 1 tablet (25 mg total) by mouth every 6 (six) hours as needed for nausea or vomiting. 02/06/17  Yes Nadara Mustard, MD  vitamin B-6 (PYRIDOXINE) 25 MG tablet Take 1 tablet (25 mg total) by mouth at bedtime as needed (nausea/vomiting). 01/23/17  Yes Schaevitz, Myra Rude, MD  hydroxyprogesterone caproate (MAKENA) 250 mg/mL OIL injection Inject 1 mL (250 mg total) into the muscle once. Patient not taking: Reported on 03/08/2017 02/12/17 02/12/17  Vena Austria, MD    Allergies Amoxicillin and Penicillins  History reviewed. No pertinent family history.  Social History Social History  Substance Use Topics  . Smoking status: Current Every Day Smoker    Packs/day: 0.50    Types: Cigarettes  . Smokeless tobacco: Never Used  . Alcohol use No    Review of Systems Constitutional: No fever/chills Eyes: No visual changes. ENT: No sore throat. Cardiovascular: Denies chest pain. Respiratory: Denies shortness of breath. Gastrointestinal: No abdominal pain.  No nausea, no vomiting.  No diarrhea.  No  constipation. Genitourinary: Negative for dysuria. Musculoskeletal: Negative for neck pain.  Negative for back pain. Integumentary: Negative for rash. Neurological: Negative for headaches, focal weakness or numbness.   ____________________________________________   PHYSICAL EXAM:  VITAL SIGNS: ED Triage Vitals  Enc Vitals Group     BP 03/08/17 1452 102/64     Pulse Rate 03/08/17 1452 96     Resp 03/08/17 1452 18     Temp 03/08/17 1452 98 F  (36.7 C)     Temp Source 03/08/17 1452 Oral     SpO2 03/08/17 1452 98 %     Weight 03/08/17 1453 72.6 kg (160 lb)     Height 03/08/17 1453 1.575 m (5\' 2" )     Head Circumference --      Peak Flow --      Pain Score 03/08/17 1452 7     Pain Loc --      Pain Edu? --      Excl. in GC? --     Constitutional: Alert and oriented. Well appearing and in no acute distress. Eyes: Conjunctivae are normal. PERRL. EOMI. Head: Atraumatic. Nose: No congestion/rhinnorhea. Mouth/Throat: Mucous membranes are moist. Neck: No stridor.  No meningeal signs.  No cervical spine tenderness to palpation.  Normal ROM with no pain nor tenderness with rotation and flexion/extension of head/neck Cardiovascular: Normal rate, regular rhythm. Good peripheral circulation. Grossly normal heart sounds. Respiratory: Normal respiratory effort.  No retractions. Lungs CTAB. Gastrointestinal: Soft and nontender. No distention.  Musculoskeletal: No lower extremity tenderness nor edema. No gross deformities of extremities. Neurologic:  Normal speech and language. No gross focal neurologic deficits are appreciated.  Skin:  Skin is warm, dry and intact. No rash noted. Psychiatric: Mood and affect are normal. Speech and behavior are normal.  ____________________________________________   LABS (all labs ordered are listed, but only abnormal results are displayed)  Labs Reviewed  CBC - Abnormal; Notable for the following:       Result Value   WBC 11.2 (*)    All other components within normal limits  BASIC METABOLIC PANEL   ____________________________________________  EKG  ED ECG REPORT I, Nieve Rojero, the attending physician, personally viewed and interpreted this ECG.  Date: 03/08/2017 EKG Time: 15:53 Rate: 75 Rhythm: normal sinus rhythm QRS Axis: normal Intervals: RSR' in V1 ST/T Wave abnormalities: Non-specific ST segment / T-wave changes, but no evidence of acute ischemia. Narrative Interpretation:  unremarkable  ____________________________________________  RADIOLOGY   No results found.  ____________________________________________   PROCEDURES  Critical Care performed: No   Procedure(s) performed:   Procedures   ____________________________________________   INITIAL IMPRESSION / ASSESSMENT AND PLAN / ED COURSE  Pertinent labs & imaging results that were available during my care of the patient were reviewed by me and considered in my medical decision making (see chart for details).     Canadian CT Head Rule   CT head is recommended if yes to ANY of the following:   Major Criteria ("high risk" for an injury requiring neurosurgical intervention, sensitivity 100%):   No.   GCS < 15 at 2 hours post-injury No.   Suspected open or depressed skull fracture No.   Any sign of basilar skull fracture? (Hemotympanum, racoon eyes, battle's sign, CSF oto/rhinorrhea) No.   ? 2 episodes of vomiting No.   Age ? 65   Minor Criteria ("medium" risk for an intracranial traumatic finding, sensitivity 83-100%):   No.   Retrograde Amnesia to the Event ? 30 minutes No.   "  Dangerous" Mechanism? (Pedestrian struck by motor vehicle, occupant ejected from motor vehicle, fall from >3 ft or >5 stairs.)   Based on my evaluation of the patient, including application of this decision instrument, CT head to evaluate for traumatic intracranial injury is not indicated at this time. I have discussed this recommendation with the patient who states understanding and agreement with this plan.       NEXUS C-spine Criteria   C-spine imaging is recommended if yes to ANY of the following (Mneumonic is "NSAID"):   No.  N - neurologic (focal) deficit present No.   S - spinal midline tenderness present No.  A - altered level of consciousness present No.    I  - intoxication present No.   D - distracting injury present   Based on my evaluation of the patient, including application of this  decision instrument, cervical spine imaging to evaluate for injury is not indicated at this time. I have discussed this recommendation with the patient who states understanding and agreement with this plan.   the patient is well-appearing and in no acute distress.  I cleared her clinically from the cervical collar as documented above.  No indication for imaging based on Canadian head CT rules and Nexus criteria.  She has a mild forehead contusion but her workup was generally reassuring and her history is consistent with a vasovagal episode.initially I was going to check a urinalysis but she has been having no symptoms of UTI and her HPI is not consistent with an infectious process. she has a follow-up appointment in 3 days with [facility].  I performed a bedside ultrasound which demonstrated a single intrauterine pregnancy; the fetus was active with  easily visible and appropriate cardiac activity.  No warning signs on EKG.I gave my usual and customary recommendations and return precautions.  She is going to drink plenty of fluids, stick to acetaminophen only (avoiding NSAIDs), and follow up as planned. ____________________________________________  FINAL CLINICAL IMPRESSION(S) / ED DIAGNOSES  Final diagnoses:  Syncope and collapse  Vasovagal episode     MEDICATIONS GIVEN DURING THIS VISIT:  Medications  acetaminophen (TYLENOL) tablet 650 mg (650 mg Oral Given 03/08/17 1548)     NEW OUTPATIENT MEDICATIONS STARTED DURING THIS VISIT:  Discharge Medication List as of 03/08/2017  4:06 PM      Discharge Medication List as of 03/08/2017  4:06 PM      Discharge Medication List as of 03/08/2017  4:06 PM       Note:  This document was prepared using Dragon voice recognition software and may include unintentional dictation errors.    Loleta Rose, MD 03/08/17 [phone]

## 2017-03-08 NOTE — Discharge Instructions (Signed)
You have been seen today in the Emergency Department (ED)  for syncope (passing out).  Your workup including labs and EKG show reassuring results.  Your symptoms may be due to mild dehydration, so it is important that you drink plenty of non-alcoholic fluids. ° °Please call your regular doctor as soon as possible to schedule the next available clinic appointment to follow up with him/her regarding your visit to the ED and your symptoms.  Return to the Emergency Department (ED)  if you have any further syncopal episodes (pass out again) or develop ANY chest pain, pressure, tightness, trouble breathing, sudden sweating, or other symptoms that concern you. ° °

## 2017-03-11 ENCOUNTER — Ambulatory Visit (INDEPENDENT_AMBULATORY_CARE_PROVIDER_SITE_OTHER): Payer: Medicaid Other | Admitting: Obstetrics & Gynecology

## 2017-03-11 VITALS — BP 112/62 | Wt 146.0 lb

## 2017-03-11 DIAGNOSIS — O09899 Supervision of other high risk pregnancies, unspecified trimester: Secondary | ICD-10-CM

## 2017-03-11 DIAGNOSIS — O09219 Supervision of pregnancy with history of pre-term labor, unspecified trimester: Secondary | ICD-10-CM

## 2017-03-11 DIAGNOSIS — O09629 Supervision of young multigravida, unspecified trimester: Secondary | ICD-10-CM

## 2017-03-11 DIAGNOSIS — Z3A15 15 weeks gestation of pregnancy: Secondary | ICD-10-CM

## 2017-03-11 NOTE — Patient Instructions (Signed)

## 2017-03-11 NOTE — Progress Notes (Signed)
Has had cramping and pain since fall PNV.  Makena start next week. Korea for cervical length due to h/o 22 week loss

## 2017-03-21 ENCOUNTER — Ambulatory Visit (INDEPENDENT_AMBULATORY_CARE_PROVIDER_SITE_OTHER): Payer: Medicaid Other

## 2017-03-21 ENCOUNTER — Ambulatory Visit (INDEPENDENT_AMBULATORY_CARE_PROVIDER_SITE_OTHER): Payer: Medicaid Other | Admitting: Advanced Practice Midwife

## 2017-03-21 DIAGNOSIS — O09629 Supervision of young multigravida, unspecified trimester: Secondary | ICD-10-CM

## 2017-03-21 DIAGNOSIS — O0990 Supervision of high risk pregnancy, unspecified, unspecified trimester: Secondary | ICD-10-CM

## 2017-03-21 DIAGNOSIS — Z362 Encounter for other antenatal screening follow-up: Secondary | ICD-10-CM | POA: Diagnosis not present

## 2017-03-21 DIAGNOSIS — O09219 Supervision of pregnancy with history of pre-term labor, unspecified trimester: Secondary | ICD-10-CM

## 2017-03-21 DIAGNOSIS — Z3A16 16 weeks gestation of pregnancy: Secondary | ICD-10-CM

## 2017-03-21 DIAGNOSIS — O09899 Supervision of other high risk pregnancies, unspecified trimester: Secondary | ICD-10-CM

## 2017-03-21 MED ORDER — HYDROXYPROGESTERONE CAPROATE 250 MG/ML IM OIL
250.0000 mg | TOPICAL_OIL | Freq: Once | INTRAMUSCULAR | Status: AC
  Administered 2017-03-21: 250 mg via INTRAMUSCULAR

## 2017-03-21 NOTE — Addendum Note (Signed)
Addended by: Loran SentersJOHNSON, Keyaan Lederman D on: 03/21/2017 04:17 PM   Modules accepted: Orders

## 2017-03-21 NOTE — Progress Notes (Signed)
Continues to have cramping and feeling light headed.  Discussed importance of adequate hydration and protein intake.  Cervix is 3.14 cm today and no change with fundal pressure.  1st 17P injection given today. She knows injections are weekly. No LOF, or VB.  Patient states she will lose her car next Friday and won't have transport for visits.  [facility]

## 2017-03-21 NOTE — Patient Instructions (Signed)

## 2017-03-28 ENCOUNTER — Ambulatory Visit (INDEPENDENT_AMBULATORY_CARE_PROVIDER_SITE_OTHER): Payer: Medicaid Other

## 2017-03-28 DIAGNOSIS — O09219 Supervision of pregnancy with history of pre-term labor, unspecified trimester: Secondary | ICD-10-CM | POA: Diagnosis not present

## 2017-03-28 DIAGNOSIS — Z8751 Personal history of pre-term labor: Secondary | ICD-10-CM

## 2017-03-28 MED ORDER — HYDROXYPROGESTERONE CAPROATE 250 MG/ML IM OIL
250.0000 mg | TOPICAL_OIL | Freq: Once | INTRAMUSCULAR | Status: AC
  Administered 2017-03-28: 250 mg via INTRAMUSCULAR

## 2017-04-04 ENCOUNTER — Ambulatory Visit (INDEPENDENT_AMBULATORY_CARE_PROVIDER_SITE_OTHER): Payer: Medicaid Other

## 2017-04-04 DIAGNOSIS — Z8751 Personal history of pre-term labor: Secondary | ICD-10-CM | POA: Diagnosis not present

## 2017-04-04 MED ORDER — HYDROXYPROGESTERONE CAPROATE 250 MG/ML IM OIL
250.0000 mg | TOPICAL_OIL | Freq: Once | INTRAMUSCULAR | Status: AC
  Administered 2017-04-04: 250 mg via INTRAMUSCULAR

## 2017-04-05 ENCOUNTER — Ambulatory Visit (INDEPENDENT_AMBULATORY_CARE_PROVIDER_SITE_OTHER): Payer: Medicaid Other | Admitting: Maternal Newborn

## 2017-04-05 ENCOUNTER — Telehealth: Payer: Self-pay

## 2017-04-05 VITALS — BP 104/68 | Wt 150.0 lb

## 2017-04-05 DIAGNOSIS — R21 Rash and other nonspecific skin eruption: Secondary | ICD-10-CM | POA: Diagnosis not present

## 2017-04-05 MED ORDER — HYDROCORTISONE 0.5 % EX CREA: 1.0000 "application " | TOPICAL_CREAM | Freq: Two times a day (BID) | CUTANEOUS | 0 refills | Status: DC

## 2017-04-05 NOTE — Telephone Encounter (Signed)
Pt c/o rash on legs, started about a week ago, it doesn't spread, not getting worse, rash is leaking fluid, has dried benadryl and other creams, has scratched so much she has bruises, burns/itches.  [phone]  appt made today 1:50

## 2017-04-05 NOTE — Progress Notes (Signed)
Obstetrics & Gynecology Office Visit   Chief Complaint: Patient complains of itchy/oozing rash on calves.  History of Present Illness: Some itchy spots appeared on her bilateral lower calves about a week ago.  She has tried calamine lotion and OTC cortisone cream without relief. She has not tried any oral  medication to relieve the itching. A few of the spots have become inflamed, and others have been excoriated with itching and are oozing clear fluid.  Review of Systems: Negative except as stated in HPI above.  Past Medical History:  Past Medical History:  Diagnosis Date  . Pyelonephritis   . Recurrent UTI     Past Surgical History:  Past Surgical History:  Procedure Laterality Date  . KIDNEY SURGERY      Gynecologic History: Patient's last menstrual period was 10/27/2016.  Obstetric History: [id number]  Family History:  No family history on file.  Social History:  Social History   Social History  . Marital status: Legally Separated    Spouse name: N/A  . Number of children: N/A  . Years of education: N/A   Occupational History  . Not on file.   Social History Main Topics  . Smoking status: Current Every Day Smoker    Packs/day: 0.50    Types: Cigarettes  . Smokeless tobacco: Never Used  . Alcohol use No  . Drug use: No  . Sexual activity: Yes   Other Topics Concern  . Not on file   Social History Narrative  . No narrative on file    Allergies:  Allergies  Allergen Reactions  . Amoxicillin Anaphylaxis  . Penicillins Swelling    Has patient had a PCN reaction causing immediate rash, facial/tongue/throat swelling, SOB or lightheadedness with hypotension: Yes Has patient had a PCN reaction causing severe rash involving mucus membranes or skin necrosis: No Has patient had a PCN reaction that required hospitalization No Has patient had a PCN reaction occurring within the last 10 years: not sure  If all of the above answers are "NO", then may proceed  with Cephalosporin use.    Medications: Medication Sig Start Date End Date Taking? Authorizing Provider  hydrocortisone cream 0.5 % Apply 1 application topically 2 (two) times daily. 04/05/17   Oswaldo Conroy, CNM  hydroxyprogesterone caproate (MAKENA) 250 mg/mL OIL injection Inject 1 mL (250 mg total) into the muscle once. Patient not taking: Reported on 03/08/2017 02/12/17 02/12/17  Vena Austria, MD    Physical Exam Vitals:  Vitals:   04/05/17 1336  BP: 104/68   Patient's last menstrual period was 10/27/2016.  General: NAD HEENT: normocephalic, anicteric Pulmonary: No increased work of breathing Skin: multiple erythematous lesions on bilateral lower extemeties, some papular and a few vesicular. Some lesions were crusted. Extremities: no edema or tenderness Neurologic: Grossly intact Psychiatric: mood appropriate, affect full  Assessment: 21 y.o. [id number] with an itchy rash that resembles poison ivy dermatitis, with possible differential diagnoses of contact dermatitis and pemphigoid gestationis.  Plan: Problem List Items Addressed This Visit     Visit Diagnoses    Rash, skin    -  Primary   Relevant Medications   hydrocortisone cream 0.5 %     Discussed different OTC remedies for poison ivy, but patient would prefer a prescription for topical medication. Rx for hydrocortisone cream, advised patient to use directed amount no more than twice daily.  Explained that she should call if rash gets worse or spreads to other areas on her body. Advised patient  that it is safe to take Benadryl PO to relieve itching, however, she does not wish to use anything other than topical medications at this time.  A total of 10 minutes were spent in face-to-face contact with the patient during this encounter with over half of that time devoted to counseling and coordination of care.  Return if symptoms worsen or fail to improve.   Marcelyn Bruins, CNM 04/05/2017  2:15 PM

## 2017-04-11 ENCOUNTER — Ambulatory Visit: Payer: Medicaid Other

## 2017-04-11 ENCOUNTER — Ambulatory Visit (INDEPENDENT_AMBULATORY_CARE_PROVIDER_SITE_OTHER): Payer: Medicaid Other

## 2017-04-11 DIAGNOSIS — O09212 Supervision of pregnancy with history of pre-term labor, second trimester: Secondary | ICD-10-CM | POA: Diagnosis not present

## 2017-04-11 DIAGNOSIS — O09892 Supervision of other high risk pregnancies, second trimester: Secondary | ICD-10-CM

## 2017-04-11 MED ORDER — HYDROXYPROGESTERONE CAPROATE 250 MG/ML IM OIL
250.0000 mg | TOPICAL_OIL | Freq: Once | INTRAMUSCULAR | Status: AC
  Administered 2017-04-11: 250 mg via INTRAMUSCULAR

## 2017-04-11 NOTE — Progress Notes (Signed)
Pt here for 17P inj which was given IM left glut.  NDC# [phone]  Pt c/o burning at last inj and right leg last week that lasted 4d.  Also c/o burning at inj site today.  Adv if like last week to call and talk c provider about possibility of allergic reaction.

## 2017-04-18 ENCOUNTER — Other Ambulatory Visit: Payer: Self-pay | Admitting: Advanced Practice Midwife

## 2017-04-18 ENCOUNTER — Ambulatory Visit (INDEPENDENT_AMBULATORY_CARE_PROVIDER_SITE_OTHER): Payer: Medicaid Other

## 2017-04-18 ENCOUNTER — Ambulatory Visit (INDEPENDENT_AMBULATORY_CARE_PROVIDER_SITE_OTHER): Payer: Medicaid Other | Admitting: Certified Nurse Midwife

## 2017-04-18 ENCOUNTER — Encounter: Payer: Self-pay | Admitting: Certified Nurse Midwife

## 2017-04-18 VITALS — BP 94/60 | Wt 153.0 lb

## 2017-04-18 DIAGNOSIS — Z3A20 20 weeks gestation of pregnancy: Secondary | ICD-10-CM

## 2017-04-18 DIAGNOSIS — O0992 Supervision of high risk pregnancy, unspecified, second trimester: Secondary | ICD-10-CM | POA: Diagnosis not present

## 2017-04-18 DIAGNOSIS — O359XX0 Maternal care for (suspected) fetal abnormality and damage, unspecified, not applicable or unspecified: Secondary | ICD-10-CM

## 2017-04-18 DIAGNOSIS — Z362 Encounter for other antenatal screening follow-up: Secondary | ICD-10-CM | POA: Diagnosis not present

## 2017-04-18 DIAGNOSIS — O0990 Supervision of high risk pregnancy, unspecified, unspecified trimester: Secondary | ICD-10-CM

## 2017-04-18 DIAGNOSIS — Z8751 Personal history of pre-term labor: Secondary | ICD-10-CM

## 2017-04-18 MED ORDER — HYDROXYPROGESTERONE CAPROATE 250 MG/ML IM OIL
250.0000 mg | TOPICAL_OIL | Freq: Once | INTRAMUSCULAR | Status: AC
Start: 2017-04-18 — End: 2017-04-18
  Administered 2017-04-18: 250 mg via INTRAMUSCULAR

## 2017-04-18 NOTE — Addendum Note (Signed)
Addended by: Reather Littler D on: 04/18/2017 01:40 PM   Modules accepted: Orders

## 2017-04-18 NOTE — Progress Notes (Signed)
Pt reports no problems. It's a BOY!

## 2017-04-18 NOTE — Progress Notes (Signed)
ROB and anatomy scan at 20wk3days. Getting weekly 17P injections for history of preterm delivery at 34wks First trimester test was negative with a Down's risk of 1:10000. NT=0.76mm Discussed findings of today's ultrasound with patient: CGA 21wk3days. 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

## 2017-04-19 ENCOUNTER — Other Ambulatory Visit: Payer: Medicaid Other

## 2017-04-19 ENCOUNTER — Encounter: Payer: Medicaid Other | Admitting: Maternal Newborn

## 2017-04-22 ENCOUNTER — Other Ambulatory Visit: Payer: Self-pay | Admitting: Certified Nurse Midwife

## 2017-04-22 DIAGNOSIS — Z3689 Encounter for other specified antenatal screening: Secondary | ICD-10-CM

## 2017-04-25 ENCOUNTER — Ambulatory Visit (INDEPENDENT_AMBULATORY_CARE_PROVIDER_SITE_OTHER): Payer: Medicaid Other | Admitting: Maternal Newborn

## 2017-04-25 ENCOUNTER — Ambulatory Visit: Payer: Medicaid Other

## 2017-04-25 VITALS — BP 98/60 | Wt 157.0 lb

## 2017-04-25 DIAGNOSIS — R3989 Other symptoms and signs involving the genitourinary system: Secondary | ICD-10-CM

## 2017-04-25 DIAGNOSIS — Z8751 Personal history of pre-term labor: Secondary | ICD-10-CM

## 2017-04-25 DIAGNOSIS — M545 Low back pain, unspecified: Secondary | ICD-10-CM

## 2017-04-25 DIAGNOSIS — O09622 Supervision of young multigravida, second trimester: Secondary | ICD-10-CM

## 2017-04-25 LAB — POCT URINALYSIS DIPSTICK
Bilirubin, UA: NEGATIVE
Glucose, UA: NEGATIVE
Ketones, UA: NEGATIVE
NITRITE UA: NEGATIVE
PROTEIN UA: NEGATIVE
RBC UA: NEGATIVE
SPEC GRAV UA: 1.025 (ref 1.010–1.025)
UROBILINOGEN UA: 2 U/dL — AB
pH, UA: 7 (ref 5.0–8.0)

## 2017-04-25 MED ORDER — HYDROXYPROGESTERONE CAPROATE 250 MG/ML IM OIL
250.0000 mg | TOPICAL_OIL | Freq: Once | INTRAMUSCULAR | Status: AC
  Administered 2017-04-25: 250 mg via INTRAMUSCULAR

## 2017-04-25 MED ORDER — HYDROXYPROGESTERONE CAPROATE 275 MG/1.1ML ~~LOC~~ SOAJ: 275.0000 mg | Freq: Once | SUBCUTANEOUS | Status: DC

## 2017-04-25 NOTE — Progress Notes (Signed)
    Routine Prenatal Care Visit  Subjective  Molly Bray is a 21 y.o. [phone] at [redacted]w[redacted]d being seen today for ongoing prenatal care.  She is currently monitored for the following issues for this high-risk pregnancy and has High-risk pregnancy, young multigravida; History of preterm delivery, currently pregnant; and Suspected fetal anomaly, antepartum on her problem list.  ----------------------------------------------------------------------------------- Patient reports backache and diarrhea since last night..   Contractions: Not present. Vag. Bleeding: None.  Movement: Present. Denies leaking of fluid.  ----------------------------------------------------------------------------------- The following portions of the patient's history were reviewed and updated as appropriate: allergies, current medications, past family history, past medical history, past social history, past surgical history and problem list. Problem list updated.   Objective  Blood pressure 98/60, weight 157 lb (71.2 kg), last menstrual period 10/27/2016. Pregravid weight 157 lb (71.2 kg) Total Weight Gain 0 lb (0 kg) Urinalysis:      Fetal Status: Fetal Heart Rate (bpm): 165   Movement: Present     General:  Alert, oriented and cooperative. Patient is in no acute distress.  Skin: Skin is warm and dry. No rash noted.   Cardiovascular: Normal heart rate noted  Respiratory: Normal respiratory effort, no problems with respiration noted  Abdomen: Soft, gravid, appropriate for gestational age. Pain/Pressure: Present     Pelvic:  Cervical exam deferred        Extremities: Normal range of motion.  Edema: None  Mental Status: Normal mood and affect. Normal behavior. Normal judgment and thought content.     Assessment   21 y.o. [id number] at [redacted]w[redacted]d by  09/02/2017, Alternate EDD Entry presenting for work-in prenatal visit.  Plan   pregnancy #3 Problems (from 10/27/16 to present)    Problem Noted Resolved   High-risk  pregnancy, young multigravida 01/09/2017 by Vena Austria, MD No   Overview Addendum 02/15/2017  6:23 AM by Vena Austria, MD    Clinic [facility] Prenatal Labs  Dating 7 week [place] Blood type: O pos  Genetic Screen (Desires) 1 Screen: negative   AFP:      Antibody:negative  Anatomic [place]  Rubella: Immune Varicella: Immune  GTT Third trimester:  RPR: NR  Rhogam  HBsAg: negative  TDaP vaccine                       Flu Shot: HIV: negative  Baby Food                                GBS:   Contraception  Pap: NIL 01/09/17  CBB   Cervical Lengths starting at 16 weeks  CS/VBAC    Support Person                17P today. Has had a couple of episodes of diarrhea last night and this morning. Advised safe OTC medications to help. Also describes bilateral lower back pain; no CVAT, probably MSK in origin. Discussed comfort measures. UA showed moderate leukocytes, urine culture sent.  Preterm labor symptoms and general obstetric precautions including but not limited to vaginal bleeding, contractions, leaking of fluid and fetal movement were reviewed in detail with the patient.  Keep next regularly scheduled appointments for 17P and ROB.  Marcelyn Bruins, CNM 04/25/2017  10:22 AM

## 2017-04-25 NOTE — Addendum Note (Signed)
Addended by: Marcelyn Bruins on: 04/25/2017 10:28 AM   Modules accepted: Orders

## 2017-04-26 ENCOUNTER — Other Ambulatory Visit: Payer: Medicaid Other

## 2017-04-26 ENCOUNTER — Encounter: Payer: Medicaid Other | Admitting: Maternal Newborn

## 2017-04-27 LAB — URINE CULTURE

## 2017-05-02 ENCOUNTER — Ambulatory Visit: Payer: Medicaid Other

## 2017-05-02 ENCOUNTER — Ambulatory Visit
Admission: RE | Admit: 2017-05-02 | Discharge: 2017-05-02 | Disposition: A | Discharge status: Home / Self Care | Payer: Medicaid Other | Source: Ambulatory Visit | Attending: Obstetrics and Gynecology | Admitting: Obstetrics and Gynecology

## 2017-05-02 VITALS — BP 122/68 | HR 127 | Temp 97.9°F | Resp 20 | Wt 153.8 lb

## 2017-05-02 DIAGNOSIS — Z362 Encounter for other antenatal screening follow-up: Secondary | ICD-10-CM | POA: Insufficient documentation

## 2017-05-02 DIAGNOSIS — O09622 Supervision of young multigravida, second trimester: Secondary | ICD-10-CM

## 2017-05-02 DIAGNOSIS — O09212 Supervision of pregnancy with history of pre-term labor, second trimester: Secondary | ICD-10-CM | POA: Insufficient documentation

## 2017-05-02 DIAGNOSIS — O359XX0 Maternal care for (suspected) fetal abnormality and damage, unspecified, not applicable or unspecified: Secondary | ICD-10-CM | POA: Diagnosis present

## 2017-05-02 DIAGNOSIS — Z3689 Encounter for other specified antenatal screening: Secondary | ICD-10-CM

## 2017-05-02 DIAGNOSIS — Z3A23 23 weeks gestation of pregnancy: Secondary | ICD-10-CM | POA: Insufficient documentation

## 2017-05-13 ENCOUNTER — Telehealth: Payer: Self-pay

## 2017-05-13 ENCOUNTER — Observation Stay
Admission: EM | Admit: 2017-05-13 | Discharge: 2017-05-13 | Disposition: A | Discharge status: Home / Self Care | Payer: Medicaid Other | Attending: Obstetrics and Gynecology | Admitting: Obstetrics and Gynecology

## 2017-05-13 DIAGNOSIS — O368120 Decreased fetal movements, second trimester, not applicable or unspecified: Secondary | ICD-10-CM | POA: Diagnosis not present

## 2017-05-13 DIAGNOSIS — Z3A25 25 weeks gestation of pregnancy: Secondary | ICD-10-CM | POA: Diagnosis not present

## 2017-05-13 DIAGNOSIS — O368190 Decreased fetal movements, unspecified trimester, not applicable or unspecified: Secondary | ICD-10-CM | POA: Diagnosis present

## 2017-05-13 NOTE — Discharge Summary (Signed)
See Final Progress Note 05/13/2017.  Marcelyn BruinsJacelyn Schmid, CNM 05/13/2017  10:49 AM

## 2017-05-13 NOTE — Telephone Encounter (Signed)
Pt calling not sure to go to hosp or be seen in office, hasn't felt the baby move in about three days.  [phone].  Pt has been admitted to  hosp before I returned her call.  Left detailed msg that she is where she needs to be.

## 2017-05-13 NOTE — OB Triage Note (Signed)
Pt states fetus has had decreased fetal movement since Friday evening. Pt denies vaginal bleeding and LOF.

## 2017-05-13 NOTE — Final Progress Note (Addendum)
Physician Final Progress Note  Patient ID: Molly Bray MRN: [id number] DOB/AGE: 22/02/1996 22 y.o.  Admit date: 05/13/2017 Admitting provider: Conard NovakStephen D Jackson, MD Discharge date: 05/13/2017   Admission Diagnoses: Decreased fetal movement  Discharge Diagnoses:  Supervision of high risk pregnancy, history of preterm delivery, suspected fetal anomaly  History of Present Illness: The patient is a 21 y.o. female [phone] at 6136w0d who presents for decreased fetal movement. She denies vaginal bleeding, loss of fluid, and contractions. She has been able to eat and drink normally and has not had any symptoms of illness. She recently stopped smoking. She mentioned that she has missed her 17 P injections the last two times, and that she plans to stop the injections because she feels better when not taking them.  10 point review of systems negative unless otherwise noted in HPI.   Past Medical History:  Diagnosis Date  . Pyelonephritis   . Recurrent UTI     Past Surgical History:  Procedure Laterality Date  . KIDNEY SURGERY    . KIDNEY SURGERY      No current facility-administered medications on file prior to encounter.    No current outpatient prescriptions on file prior to encounter.    Allergies  Allergen Reactions  . Amoxicillin Anaphylaxis  . Penicillins Swelling    Has patient had a PCN reaction causing immediate rash, facial/tongue/throat swelling, SOB or lightheadedness with hypotension: Yes Has patient had a PCN reaction causing severe rash involving mucus membranes or skin necrosis: No Has patient had a PCN reaction that required hospitalization No Has patient had a PCN reaction occurring within the last 10 years:  not sure  If all of the above answers are "NO", then may proceed with Cephalosporin use.    Social History   Social History  . Marital status: Legally Separated    Spouse name: N/A  . Number of children: N/A  . Years of education: N/A    Occupational History  . Not on file.   Social History Main Topics  . Smoking status: Current Every Day Smoker    Packs/day: 1.00    Types: Cigarettes  . Smokeless tobacco: Never Used  . Alcohol use No  . Drug use: No  . Sexual activity: Yes    Birth control/ protection: None   Other Topics Concern  . Not on file   Social History Narrative  . No narrative on file    Physical Exam: BP 96/60 (BP Location: Left Arm)   Pulse 93   Temp 98.1 F (36.7 C) (Oral)   Resp 16   Ht 5\' 3"  (1.6 m)   Wt 165 lb (74.8 kg)   LMP 10/27/2016   BMI 29.23 kg/m   Gen: NAD CV: RRR Pulm: no increased work of breathing or dyspnea Pelvic: deferred Ext: no erythema, no tenderness, no signs of DVT  Consults: None  Significant Findings/ Diagnostic Studies: Fetal movement detected as soon as monitors were applied and continued to move well throughout NST.  Procedures: NST  Baseline: 150 Variability: moderate Accelerations: present Decelerations: absent Tocometry: occasional irritability The patient was monitored for 30+ minutes, fetal heart rate tracing was deemed reactive, category I tracing,  Discharge Condition: good  Disposition: 01-Home or Self Care  Diet: Regular diet  Discharge Activity: Activity as tolerated  Discharge Instructions    Discharge activity:  No Restrictions    Complete by:  As directed    Discharge diet:  No restrictions    Complete by:  As  directed    No sexual activity restrictions    Complete by:  As directed    Notify physician for a general feeling that "something is not right"    Complete by:  As directed    Notify physician for increase or change in vaginal discharge    Complete by:  As directed    Notify physician for intestinal cramps, with or without diarrhea, sometimes described as "gas pain"    Complete by:  As directed    Notify physician for leaking of fluid    Complete by:  As directed    Notify physician for low, dull backache,  unrelieved by heat or Tylenol    Complete by:  As directed    Notify physician for menstrual like cramps    Complete by:  As directed    Notify physician for pelvic pressure    Complete by:  As directed    Notify physician for uterine contractions.  These may be painless and feel like the uterus is tightening or the baby is  "balling up"    Complete by:  As directed    Notify physician for vaginal bleeding    Complete by:  As directed    PRETERM LABOR:  Includes any of the follwing symptoms that occur between 20 - [redacted] weeks gestation.  If these symptoms are not stopped, preterm labor can result in preterm delivery, placing your baby at risk    Complete by:  As directed      Allergies as of 05/13/2017      Reactions   Amoxicillin Anaphylaxis   Penicillins Swelling   Has patient had a PCN reaction causing immediate rash, facial/tongue/throat swelling, SOB or lightheadedness with hypotension: Yes Has patient had a PCN reaction causing severe rash involving mucus membranes or skin necrosis: No Has patient had a PCN reaction that required hospitalization No Has patient had a PCN reaction occurring within the last 10 years: not sure  If all of the above answers are "NO", then may proceed with Cephalosporin use.      Medication List    You have not been prescribed any medications.    Follow-up Information    [facility]. Go to.   Why:  regular scheduled appt. Contact information: [address] [place] [address] [phone]         Encouraged patient to resume 17P injections for prevention of preterm labor. Advised her to return to care if decreased fetal movement noted again.  Total time spent taking care of this patient: 15 minutes.  Signed: Oswaldo Conroy, CNM  05/13/2017, 10:44 AM

## 2017-05-16 ENCOUNTER — Encounter: Payer: Medicaid Other | Admitting: Certified Nurse Midwife

## 2017-05-17 ENCOUNTER — Ambulatory Visit (INDEPENDENT_AMBULATORY_CARE_PROVIDER_SITE_OTHER): Payer: Medicaid Other | Admitting: Certified Nurse Midwife

## 2017-05-17 VITALS — BP 98/58 | Wt 159.0 lb

## 2017-05-17 DIAGNOSIS — M533 Sacrococcygeal disorders, not elsewhere classified: Secondary | ICD-10-CM

## 2017-05-17 DIAGNOSIS — Z131 Encounter for screening for diabetes mellitus: Secondary | ICD-10-CM

## 2017-05-17 DIAGNOSIS — Z113 Encounter for screening for infections with a predominantly sexual mode of transmission: Secondary | ICD-10-CM

## 2017-05-17 DIAGNOSIS — R3911 Hesitancy of micturition: Secondary | ICD-10-CM

## 2017-05-17 DIAGNOSIS — O09622 Supervision of young multigravida, second trimester: Secondary | ICD-10-CM

## 2017-05-17 DIAGNOSIS — O09899 Supervision of other high risk pregnancies, unspecified trimester: Secondary | ICD-10-CM

## 2017-05-17 DIAGNOSIS — O09219 Supervision of pregnancy with history of pre-term labor, unspecified trimester: Secondary | ICD-10-CM

## 2017-05-17 LAB — POCT URINALYSIS DIPSTICK
Bilirubin, UA: NEGATIVE
Blood, UA: NEGATIVE
GLUCOSE UA: NEGATIVE
Ketones, UA: NEGATIVE
Nitrite, UA: NEGATIVE
PROTEIN UA: NEGATIVE
SPEC GRAV UA: 1.01 (ref 1.010–1.025)
UROBILINOGEN UA: 0.2 U/dL
pH, UA: 7.5 (ref 5.0–8.0)

## 2017-05-17 NOTE — Progress Notes (Signed)
HROB at 25wk4d history of previous PTD at 34 weeks-was on 17 P until 21 weeks. States she had "firey burning in her legs and a rash on her legs" after the last two injections. The burning sensation and some of the rash has resolved since stopping the injections. She feels less stressed then when she had her first baby-feels she is at lower risk for delivering early. She has cut down cigarettes to 2-3 cigs/day. Aso using a e-cigarette "without nicotine"?  Was seen in L&D last week for decreased FM- FHR strip was reassuring. Feeling baby move a little better since then Ultrasound by [facility] to look at fetal neck/pharynx-MFM thought fluid in pharynx may just be a normal variant, but can't rule out brachial cleft cyst or TE fistula. [facility]  28 week labs and ROB in 3-4 weeks

## 2017-05-17 NOTE — Progress Notes (Signed)
Pt reports no problems. Pt declines 17P and declines flu vaccine.

## 2017-05-19 LAB — URINE CULTURE

## 2017-06-13 ENCOUNTER — Ambulatory Visit
Admission: RE | Admit: 2017-06-13 | Discharge: 2017-06-13 | Disposition: A | Discharge status: Home / Self Care | Payer: Medicaid Other | Source: Ambulatory Visit | Attending: Maternal & Fetal Medicine | Admitting: Maternal & Fetal Medicine

## 2017-06-13 DIAGNOSIS — O09622 Supervision of young multigravida, second trimester: Secondary | ICD-10-CM

## 2017-06-13 DIAGNOSIS — O359XX0 Maternal care for (suspected) fetal abnormality and damage, unspecified, not applicable or unspecified: Secondary | ICD-10-CM

## 2017-06-14 ENCOUNTER — Ambulatory Visit (INDEPENDENT_AMBULATORY_CARE_PROVIDER_SITE_OTHER): Payer: Medicaid Other | Admitting: Certified Nurse Midwife

## 2017-06-14 ENCOUNTER — Other Ambulatory Visit: Payer: Medicaid Other

## 2017-06-14 VITALS — BP 102/58 | Wt 163.0 lb

## 2017-06-14 DIAGNOSIS — Z113 Encounter for screening for infections with a predominantly sexual mode of transmission: Secondary | ICD-10-CM

## 2017-06-14 DIAGNOSIS — Z3A29 29 weeks gestation of pregnancy: Secondary | ICD-10-CM

## 2017-06-14 DIAGNOSIS — Z131 Encounter for screening for diabetes mellitus: Secondary | ICD-10-CM

## 2017-06-14 DIAGNOSIS — O09219 Supervision of pregnancy with history of pre-term labor, unspecified trimester: Secondary | ICD-10-CM

## 2017-06-14 DIAGNOSIS — O09899 Supervision of other high risk pregnancies, unspecified trimester: Secondary | ICD-10-CM

## 2017-06-14 DIAGNOSIS — O09622 Supervision of young multigravida, second trimester: Secondary | ICD-10-CM

## 2017-06-14 DIAGNOSIS — O09623 Supervision of young multigravida, third trimester: Secondary | ICD-10-CM

## 2017-06-14 NOTE — Progress Notes (Signed)
Pt reports no problems. 28 wk labs today. Growth scan at [facility] yesterday.

## 2017-06-15 LAB — 28 WEEK RH+PANEL
BASOS: 0 %
Basophils Absolute: 0 10*3/uL (ref 0.0–0.2)
EOS (ABSOLUTE): 0.2 10*3/uL (ref 0.0–0.4)
EOS: 1 %
GESTATIONAL DIABETES SCREEN: 67 mg/dL (ref 65–139)
HEMOGLOBIN: 11.7 g/dL (ref 11.1–15.9)
HIV Screen 4th Generation wRfx: NONREACTIVE
Hematocrit: 35.6 % (ref 34.0–46.6)
Immature Grans (Abs): 0.1 10*3/uL (ref 0.0–0.1)
Immature Granulocytes: 1 %
Lymphocytes Absolute: 2 10*3/uL (ref 0.7–3.1)
Lymphs: 18 %
MCH: 31.1 pg (ref 26.6–33.0)
MCHC: 32.9 g/dL (ref 31.5–35.7)
MCV: 95 fL (ref 79–97)
MONOS ABS: 0.8 10*3/uL (ref 0.1–0.9)
Monocytes: 7 %
NEUTROS ABS: 8.5 10*3/uL — AB (ref 1.4–7.0)
Neutrophils: 73 %
Platelets: 273 10*3/uL (ref 150–379)
RBC: 3.76 x10E6/uL — AB (ref 3.77–5.28)
RDW: 13.2 % (ref 12.3–15.4)
RPR: NONREACTIVE
WBC: 11.6 10*3/uL — AB (ref 3.4–10.8)

## 2017-06-15 NOTE — Progress Notes (Signed)
HROB at 29wk4d history of previous PTD at 2434 weeks-stopped 17 P injections at 21 weeks for possible allergic reaction [facility] CGA 30wk2d with EFW 3#3oz (49th%), cephalic, normal anatomy, AFI 16.10RU18.63cm Feeling good FM. Having some BH contractions. Continues to smoke and use e-cigarette Wants to breast feed. Discussed birth control postpartum. Was considering BTL-explained high rate of regret when young women get BTL. Thinking of the Mirena IUD. Had 28 week labs today  Preterm labor precautions ROB in 2 weeks

## 2017-06-28 ENCOUNTER — Encounter: Payer: Self-pay | Admitting: Advanced Practice Midwife

## 2017-06-28 ENCOUNTER — Ambulatory Visit (INDEPENDENT_AMBULATORY_CARE_PROVIDER_SITE_OTHER): Payer: Medicaid Other | Admitting: Advanced Practice Midwife

## 2017-06-28 VITALS — BP 112/68 | Wt 166.0 lb

## 2017-06-28 DIAGNOSIS — Z3A31 31 weeks gestation of pregnancy: Secondary | ICD-10-CM

## 2017-06-28 DIAGNOSIS — F329 Major depressive disorder, single episode, unspecified: Secondary | ICD-10-CM

## 2017-06-28 DIAGNOSIS — O99343 Other mental disorders complicating pregnancy, third trimester: Secondary | ICD-10-CM

## 2017-06-28 DIAGNOSIS — F32A Depression, unspecified: Secondary | ICD-10-CM

## 2017-06-28 DIAGNOSIS — R51 Headache: Secondary | ICD-10-CM

## 2017-06-28 DIAGNOSIS — Z23 Encounter for immunization: Secondary | ICD-10-CM

## 2017-06-28 DIAGNOSIS — O26893 Other specified pregnancy related conditions, third trimester: Secondary | ICD-10-CM

## 2017-06-28 MED ORDER — BUTALBITAL-APAP-CAFFEINE 50-325-40 MG PO CAPS: 1.0000 | ORAL_CAPSULE | Freq: Four times a day (QID) | ORAL | 1 refills | Status: DC | PRN

## 2017-06-28 MED ORDER — SERTRALINE HCL 50 MG PO TABS: 50.0000 mg | ORAL_TABLET | Freq: Every day | ORAL | 2 refills | Status: DC

## 2017-06-28 NOTE — Patient Instructions (Signed)
Third Trimester of Pregnancy The third trimester is from week 28 through week 40 (months 7 through 9). The third trimester is a time when the unborn baby (fetus) is growing rapidly. At the end of the ninth month, the fetus is about 20 inches in length and weighs 6-10 pounds. Body changes during your third trimester Your body will continue to go through many changes during pregnancy. The changes vary from woman to woman. During the third trimester:  Your weight will continue to increase. You can expect to gain 25-35 pounds (11-16 kg) by the end of the pregnancy.  You may begin to get stretch marks on your hips, abdomen, and breasts.  You may urinate more often because the fetus is moving lower into your pelvis and pressing on your bladder.  You may develop or continue to have heartburn. This is caused by increased hormones that slow down muscles in the digestive tract.  You may develop or continue to have constipation because increased hormones slow digestion and cause the muscles that push waste through your intestines to relax.  You may develop hemorrhoids. These are swollen veins (varicose veins) in the rectum that can itch or be painful.  You may develop swollen, bulging veins (varicose veins) in your legs.  You may have increased body aches in the pelvis, back, or thighs. This is due to weight gain and increased hormones that are relaxing your joints.  You may have changes in your hair. These can include thickening of your hair, rapid growth, and changes in texture. Some women also have hair loss during or after pregnancy, or hair that feels dry or thin. Your hair will most likely return to normal after your baby is born.  Your breasts will continue to grow and they will continue to become tender. A yellow fluid (colostrum) may leak from your breasts. This is the first milk you are producing for your baby.  Your belly button may stick out.  You may notice more swelling in your hands,  face, or ankles.  You may have increased tingling or numbness in your hands, arms, and legs. The skin on your belly may also feel numb.  You may feel short of breath because of your expanding uterus.  You may have more problems sleeping. This can be caused by the size of your belly, increased need to urinate, and an increase in your body's metabolism.  You may notice the fetus "dropping," or moving lower in your abdomen (lightening).  You may have increased vaginal discharge.  You may notice your joints feel loose and you may have pain around your pelvic bone.  What to expect at prenatal visits You will have prenatal exams every 2 weeks until week 36. Then you will have weekly prenatal exams. During a routine prenatal visit:  You will be weighed to make sure you and the baby are growing normally.  Your blood pressure will be taken.  Your abdomen will be measured to track your baby's growth.  The fetal heartbeat will be listened to.  Any test results from the previous visit will be discussed.  You may have a cervical check near your due date to see if your cervix has softened or thinned (effaced).  You will be tested for Group B streptococcus. This happens between 35 and 37 weeks.  Your health care provider may ask you:  What your birth plan is.  How you are feeling.  If you are feeling the baby move.  If you have had   any abnormal symptoms, such as leaking fluid, bleeding, severe headaches, or abdominal cramping.  If you are using any tobacco products, including cigarettes, chewing tobacco, and electronic cigarettes.  If you have any questions.  Other tests or screenings that may be performed during your third trimester include:  Blood tests that check for low iron levels (anemia).  Fetal testing to check the health, activity level, and growth of the fetus. Testing is done if you have certain medical conditions or if there are problems during the  pregnancy.  Nonstress test (NST). This test checks the health of your baby to make sure there are no signs of problems, such as the baby not getting enough oxygen. During this test, a belt is placed around your belly. The baby is made to move, and its heart rate is monitored during movement.  What is false labor? False labor is a condition in which you feel small, irregular tightenings of the muscles in the womb (contractions) that usually go away with rest, changing position, or drinking water. These are called Braxton Hicks contractions. Contractions may last for hours, days, or even weeks before true labor sets in. If contractions come at regular intervals, become more frequent, increase in intensity, or become painful, you should see your health care provider. What are the signs of labor?  Abdominal cramps.  Regular contractions that start at 10 minutes apart and become stronger and more frequent with time.  Contractions that start on the top of the uterus and spread down to the lower abdomen and back.  Increased pelvic pressure and dull back pain.  A watery or bloody mucus discharge that comes from the vagina.  Leaking of amniotic fluid. This is also known as your "water breaking." It could be a slow trickle or a gush. Let your health care provider know if it has a color or strange odor. If you have any of these signs, call your health care provider right away, even if it is before your due date. Follow these instructions at home: Medicines  Follow your health care provider's instructions regarding medicine use. Specific medicines may be either safe or unsafe to take during pregnancy.  Take a prenatal vitamin that contains at least 600 micrograms (mcg) of folic acid.  If you develop constipation, try taking a stool softener if your health care provider approves. Eating and drinking  Eat a balanced diet that includes fresh fruits and vegetables, whole grains, good sources of protein  such as meat, eggs, or tofu, and low-fat dairy. Your health care provider will help you determine the amount of weight gain that is right for you.  Avoid raw meat and uncooked cheese. These carry germs that can cause birth defects in the baby.  If you have low calcium intake from food, talk to your health care provider about whether you should take a daily calcium supplement.  Eat four or five small meals rather than three large meals a day.  Limit foods that are high in fat and processed sugars, such as fried and sweet foods.  To prevent constipation: ? Drink enough fluid to keep your urine clear or pale yellow. ? Eat foods that are high in fiber, such as fresh fruits and vegetables, whole grains, and beans. Activity  Exercise only as directed by your health care provider. Most women can continue their usual exercise routine during pregnancy. Try to exercise for 30 minutes at least 5 days a week. Stop exercising if you experience uterine contractions.  Avoid heavy   lifting.  Do not exercise in extreme heat or humidity, or at high altitudes.  Wear low-heel, comfortable shoes.  Practice good posture.  You may continue to have sex unless your health care provider tells you otherwise. Relieving pain and discomfort  Take frequent breaks and rest with your legs elevated if you have leg cramps or low back pain.  Take warm sitz baths to soothe any pain or discomfort caused by hemorrhoids. Use hemorrhoid cream if your health care provider approves.  Wear a good support bra to prevent discomfort from breast tenderness.  If you develop varicose veins: ? Wear support pantyhose or compression stockings as told by your healthcare provider. ? Elevate your feet for 15 minutes, 3-4 times a day. Prenatal care  Write down your questions. Take them to your prenatal visits.  Keep all your prenatal visits as told by your health care provider. This is important. Safety  Wear your seat belt at  all times when driving.  Make a list of emergency phone numbers, including numbers for family, friends, the hospital, and police and fire departments. General instructions  Avoid cat litter boxes and soil used by cats. These carry germs that can cause birth defects in the baby. If you have a cat, ask someone to clean the litter box for you.  Do not travel far distances unless it is absolutely necessary and only with the approval of your health care provider.  Do not use hot tubs, steam rooms, or saunas.  Do not drink alcohol.  Do not use any products that contain nicotine or tobacco, such as cigarettes and e-cigarettes. If you need help quitting, ask your health care provider.  Do not use any medicinal herbs or unprescribed drugs. These chemicals affect the formation and growth of the baby.  Do not douche or use tampons or scented sanitary pads.  Do not cross your legs for long periods of time.  To prepare for the arrival of your baby: ? Take prenatal classes to understand, practice, and ask questions about labor and delivery. ? Make a trial run to the hospital. ? Visit the hospital and tour the maternity area. ? Arrange for maternity or paternity leave through employers. ? Arrange for family and friends to take care of pets while you are in the hospital. ? Purchase a rear-facing car seat and make sure you know how to install it in your car. ? Pack your hospital bag. ? Prepare the baby's nursery. Make sure to remove all pillows and stuffed animals from the baby's crib to prevent suffocation.  Visit your dentist if you have not gone during your pregnancy. Use a soft toothbrush to brush your teeth and be gentle when you floss. Contact a health care provider if:  You are unsure if you are in labor or if your water has broken.  You become dizzy.  You have mild pelvic cramps, pelvic pressure, or nagging pain in your abdominal area.  You have lower back pain.  You have persistent  nausea, vomiting, or diarrhea.  You have an unusual or bad smelling vaginal discharge.  You have pain when you urinate. Get help right away if:  Your water breaks before 37 weeks.  You have regular contractions less than 5 minutes apart before 37 weeks.  You have a fever.  You are leaking fluid from your vagina.  You have spotting or bleeding from your vagina.  You have severe abdominal pain or cramping.  You have rapid weight loss or weight gain.    You have shortness of breath with chest pain.  You notice sudden or extreme swelling of your face, hands, ankles, feet, or legs.  Your baby makes fewer than 10 movements in 2 hours.  You have severe headaches that do not go away when you take medicine.  You have vision changes. Summary  The third trimester is from week 28 through week 40, months 7 through 9. The third trimester is a time when the unborn baby (fetus) is growing rapidly.  During the third trimester, your discomfort may increase as you and your baby continue to gain weight. You may have abdominal, leg, and back pain, sleeping problems, and an increased need to urinate.  During the third trimester your breasts will keep growing and they will continue to become tender. A yellow fluid (colostrum) may leak from your breasts. This is the first milk you are producing for your baby.  False labor is a condition in which you feel small, irregular tightenings of the muscles in the womb (contractions) that eventually go away. These are called Braxton Hicks contractions. Contractions may last for hours, days, or even weeks before true labor sets in.  Signs of labor can include: abdominal cramps; regular contractions that start at 10 minutes apart and become stronger and more frequent with time; watery or bloody mucus discharge that comes from the vagina; increased pelvic pressure and dull back pain; and leaking of amniotic fluid. This information is not intended to replace advice  given to you by your health care provider. Make sure you discuss any questions you have with your health care provider. Document Released: 06/26/2001 Document Revised: 12/08/2015 Document Reviewed: 09/02/2012 Elsevier Interactive Patient Education  2017 [employer]  

## 2017-06-28 NOTE — Addendum Note (Signed)
Addended by: Tresea MallGLEDHILL, Aleem Elza on: 06/28/2017 10:22 AM   Modules accepted: Orders

## 2017-06-28 NOTE — Progress Notes (Signed)
ROB Epigastric pain Swelling in ankles Headache TDAP/blood form today

## 2017-06-28 NOTE — Progress Notes (Addendum)
Routine Prenatal Care Visit  Subjective  Molly Bray is a 21 y.o. [phone] at [redacted]w[redacted]d being seen today for ongoing prenatal care.  She is currently monitored for the following issues for this high-risk pregnancy and has High-risk pregnancy, young multigravida; History of preterm delivery, currently pregnant; Suspected fetal anomaly, antepartum; and Decreased fetal movement on their problem list.  ----------------------------------------------------------------------------------- Patient reports headache that she has had for 4 days. She has taken tylenol for the headache without much relief. She also has general discomfort in her pelvis and epigastric, and some mild swelling in her ankles.  The patient has current increased social stressors and is feeling depressed related to that. She is seen today by M.Christin FudgeSteele. She is requesting an antidepressant Contractions: Not present. Vag. Bleeding: None.  Movement: Present. Denies leaking of fluid.  ----------------------------------------------------------------------------------- The following portions of the patient's history were reviewed and updated as appropriate: allergies, current medications, past family history, past medical history, past social history, past surgical history and problem list. Problem list updated.   Objective  Blood pressure 112/68, weight 166 lb (75.3 kg), last menstrual period 10/27/2016. Pregravid weight 157 lb (71.2 kg) Total Weight Gain 9 lb (4.082 kg) Urinalysis: Urine Protein: Negative Urine Glucose: Negative  Fetal Status: Fetal Heart Rate (bpm): 147 Fundal Height: 30 cm Movement: Present     General:  Alert, oriented and cooperative. Patient is in no acute distress.  Skin: Skin is warm and dry. No rash noted.   Cardiovascular: Normal heart rate noted  Respiratory: Normal respiratory effort, no problems with respiration noted  Abdomen: Soft, gravid, appropriate for gestational age. Pain/Pressure: Present       Pelvic:  Cervical exam deferred        Extremities: Normal range of motion.   Trace edema  Mental Status: Normal mood and affect. Normal behavior. Normal judgment and thought content.   Assessment   21 y.o. [id number] at 3955w4d by  08/26/2017, Alternate EDD Entry presenting for routine prenatal visit  Plan   pregnancy #3 Problems (from 10/27/16 to present)    Problem Noted Resolved   High-risk pregnancy, young multigravida 01/09/2017 by Vena AustriaStaebler, Andreas, MD No   Overview Addendum 06/17/2017  1:32 PM by Farrel ConnersGutierrez, Colleen, CNM    Clinic [facility] Prenatal Labs  Dating 7 week US Blood type: O pos  Genetic Screen (Desires) 1 Screen: negative   AFP:      Antibody:negative  Anatomic US Prominent oropharynx-follow up scan by [facility] WNL Rubella: Immune Varicella: Immune  GTT Third trimester: 67 RPR: NR  Rhogam  HBsAg: negative  TDaP vaccine                       Flu Shot: HIV: negative  Baby Food      Breast                          GBS:   Contraception Mirena Pap: NIL 01/09/17  CBB   Cervical Lengths starting at 16 weeks  CS/VBAC    Support Person                   Preterm labor symptoms and general obstetric precautions including but not limited to vaginal bleeding, contractions, leaking of fluid and fetal movement were reviewed in detail with the patient. Please refer to After Visit Summary for other counseling recommendations.   Encouraged to increase hydration to help with headache/lower extremity swelling and do stretches/hands and knees  for optimal fetal positioning/relief of discomfort.  Rx Fioricet for headache Rx for Zoloft  Return in about 2 weeks (around 07/12/2017) for rob.  Tresea MallJane Lloyd Ayo, CNM  06/28/2017 9:34 AM

## 2017-07-07 ENCOUNTER — Observation Stay
Admission: EM | Admit: 2017-07-07 | Discharge: 2017-07-08 | Discharge status: Against Medical Advice | Payer: Medicaid Other | Attending: Obstetrics and Gynecology | Admitting: Obstetrics and Gynecology

## 2017-07-07 ENCOUNTER — Emergency Department
Admission: EM | Admit: 2017-07-07 | Discharge: 2017-07-07 | Disposition: A | Discharge status: Home / Self Care | Payer: Medicaid Other | Source: Home / Self Care | Attending: Emergency Medicine | Admitting: Emergency Medicine

## 2017-07-07 ENCOUNTER — Other Ambulatory Visit: Payer: Self-pay

## 2017-07-07 ENCOUNTER — Encounter: Payer: Self-pay | Admitting: Emergency Medicine

## 2017-07-07 DIAGNOSIS — Z79899 Other long term (current) drug therapy: Secondary | ICD-10-CM | POA: Insufficient documentation

## 2017-07-07 DIAGNOSIS — O98313 Other infections with a predominantly sexual mode of transmission complicating pregnancy, third trimester: Secondary | ICD-10-CM | POA: Diagnosis not present

## 2017-07-07 DIAGNOSIS — Z880 Allergy status to penicillin: Secondary | ICD-10-CM | POA: Insufficient documentation

## 2017-07-07 DIAGNOSIS — F329 Major depressive disorder, single episode, unspecified: Secondary | ICD-10-CM | POA: Diagnosis not present

## 2017-07-07 DIAGNOSIS — J069 Acute upper respiratory infection, unspecified: Secondary | ICD-10-CM

## 2017-07-07 DIAGNOSIS — O99343 Other mental disorders complicating pregnancy, third trimester: Secondary | ICD-10-CM | POA: Insufficient documentation

## 2017-07-07 DIAGNOSIS — F419 Anxiety disorder, unspecified: Secondary | ICD-10-CM | POA: Diagnosis not present

## 2017-07-07 DIAGNOSIS — O09219 Supervision of pregnancy with history of pre-term labor, unspecified trimester: Secondary | ICD-10-CM

## 2017-07-07 DIAGNOSIS — Z3A32 32 weeks gestation of pregnancy: Secondary | ICD-10-CM | POA: Insufficient documentation

## 2017-07-07 DIAGNOSIS — F17210 Nicotine dependence, cigarettes, uncomplicated: Secondary | ICD-10-CM | POA: Insufficient documentation

## 2017-07-07 DIAGNOSIS — O09213 Supervision of pregnancy with history of pre-term labor, third trimester: Principal | ICD-10-CM | POA: Insufficient documentation

## 2017-07-07 DIAGNOSIS — O09899 Supervision of other high risk pregnancies, unspecified trimester: Secondary | ICD-10-CM

## 2017-07-07 DIAGNOSIS — Z87440 Personal history of urinary (tract) infections: Secondary | ICD-10-CM | POA: Insufficient documentation

## 2017-07-07 DIAGNOSIS — O99333 Smoking (tobacco) complicating pregnancy, third trimester: Secondary | ICD-10-CM | POA: Insufficient documentation

## 2017-07-07 DIAGNOSIS — O09623 Supervision of young multigravida, third trimester: Secondary | ICD-10-CM

## 2017-07-07 DIAGNOSIS — A5901 Trichomonal vulvovaginitis: Secondary | ICD-10-CM | POA: Diagnosis not present

## 2017-07-07 DIAGNOSIS — O09629 Supervision of young multigravida, unspecified trimester: Secondary | ICD-10-CM

## 2017-07-07 HISTORY — DX: Major depressive disorder, single episode, unspecified: F32.9

## 2017-07-07 HISTORY — DX: Depression, unspecified: F32.A

## 2017-07-07 HISTORY — DX: Anxiety disorder, unspecified: F41.9

## 2017-07-07 LAB — URINALYSIS, ROUTINE W REFLEX MICROSCOPIC
BILIRUBIN URINE: NEGATIVE
Glucose, UA: NEGATIVE mg/dL
HGB URINE DIPSTICK: NEGATIVE
KETONES UR: NEGATIVE mg/dL
Nitrite: NEGATIVE
PROTEIN: NEGATIVE mg/dL
Specific Gravity, Urine: 1.015 (ref 1.005–1.030)
pH: 7 (ref 5.0–8.0)

## 2017-07-07 MED ORDER — AZITHROMYCIN 250 MG PO TABS: ORAL_TABLET | ORAL | 0 refills | Status: DC

## 2017-07-07 NOTE — ED Notes (Signed)
First nurse note  Pt is 35 weeks preg  Feeling light headed  Positive cough  Also has had some n/v  Spoke with L&D  Would like to have her eval.in ED first

## 2017-07-07 NOTE — Discharge Instructions (Signed)
Follow-up with your regular doctor if you are not better in 3-5 days, if you continued to have cough and congestion after 3 days please see her OB/GYN, if he began to have cramping or any signs of labor please return to the emergency department

## 2017-07-07 NOTE — ED Triage Notes (Signed)
Pt to ED via POV pt c/o cough and congestion. Pt states that the coughing is so severe that it causes her to vomit. Pt states that she has very thick mucus. Pt denies any complaints related to pregnancy. Pt in NAD at this time.

## 2017-07-07 NOTE — OB Triage Note (Addendum)
353w6d, G3/P2 to hospital reporting feeling contractions every 5 minutes or less since 1500. Rates lower abdominal pain 5/10. Reports possible leaking of fluid, possibly urine "because I have that problem too." Hx of preterm labor at 24 weeks with last baby. Reports positive fetal movement. Nitrazine negative. Monitors applied and assessing.

## 2017-07-07 NOTE — ED Provider Notes (Signed)
[facility] Emergency Department Provider Note  ____________________________________________   First MD Initiated Contact with Patient 07/07/17 1004     (approximate)  I have reviewed the triage vital signs and the nursing notes.   HISTORY  Chief Complaint Cough    HPI Molly Bray is a 21 y.o. female complains of cough, congestion, no fever or chills, states mucus is dark, symptoms for several days, coughing is so severe that she did will make her vomit, patient is 8 months pregnant, she has no complaints with the pregnancy at this time, she denies any vaginal bleeding or cramping  Past Medical History:  Diagnosis Date  . Pyelonephritis   . Recurrent UTI     Patient Active Problem List   Diagnosis Date Noted  . Decreased fetal movement 05/13/2017  . Suspected fetal anomaly, antepartum 04/18/2017  . High-risk pregnancy, young multigravida 01/09/2017  . History of preterm delivery, currently pregnant 01/09/2017    Past Surgical History:  Procedure Laterality Date  . KIDNEY SURGERY    . KIDNEY SURGERY      Prior to Admission medications   Medication Sig Start Date End Date Taking? Authorizing Provider  azithromycin (ZITHROMAX Z-PAK) 250 MG tablet 2 pills today then 1 pill a day for 4 days 07/07/17   Faythe GheeFisher, Daniah Zaldivar W, PA-C  Butalbital-APAP-Caffeine [id number] MG capsule Take 1-2 capsules by mouth every 6 (six) hours as needed for headache. 06/28/17   Tresea MallGledhill, Jane, CNM  sertraline (ZOLOFT) 50 MG tablet Take 1 tablet (50 mg total) by mouth daily. 06/28/17   Tresea MallGledhill, Jane, CNM    Allergies Amoxicillin and Penicillins  History reviewed. No pertinent family history.  Social History Social History   Tobacco Use  . Smoking status: Current Every Day Smoker    Packs/day: 1.00    Types: Cigarettes  . Smokeless tobacco: Never Used  Substance Use Topics  . Alcohol use: No  . Drug use: No    Review of Systems  Constitutional: No  fever/chills Eyes: No visual changes. ENT: No sore throat.  Positive runny nose and congestion with sinus pain Respiratory: Positive cough Genitourinary: Negative for dysuria. Musculoskeletal: Negative for back pain. Skin: Negative for rash.    ____________________________________________   PHYSICAL EXAM:  VITAL SIGNS: ED Triage Vitals [07/07/17 0934]  Enc Vitals Group     BP 109/64     Pulse Rate (!) 104     Resp 18     Temp 98.6 F (37 C)     Temp Source Oral     SpO2 98 %     Weight 170 lb (77.1 kg)     Height 5\' 6"  (1.676 m)     Head Circumference      Peak Flow      Pain Score      Pain Loc      Pain Edu?      Excl. in GC?     Constitutional: Alert and oriented. Well appearing and in no acute distress. Eyes: Conjunctivae are normal.  Head: Atraumatic.  TMs are clear Nose: Positive congestion/rhinnorhea. Mouth/Throat: Mucous membranes are moist.  Throat is normal Cardiovascular: Normal rate, regular rhythm.  Heart sounds are normal Respiratory: Normal respiratory effort.  No retractions, lungs are clear to auscultation, cough is deep and congested GU: deferred Musculoskeletal: FROM all extremities, warm and well perfused Neurologic:  Normal speech and language.  Skin:  Skin is warm, dry and intact. No rash noted. Psychiatric: Mood and affect are normal.  Speech and behavior are normal.  ____________________________________________   LABS (all labs ordered are listed, but only abnormal results are displayed)  Labs Reviewed - No data to display ____________________________________________   ____________________________________________  RADIOLOGY    ____________________________________________   PROCEDURES  Procedure(s) performed: No      ____________________________________________   INITIAL IMPRESSION / ASSESSMENT AND PLAN / ED COURSE  Pertinent labs & imaging results that were available during my care of the patient were reviewed by me  and considered in my medical decision making (see chart for details).  Patient is a 21 year old female that is 8 months pregnant, she is here with an acute upper respiratory infection, prescription for Z-Pak was given, she is to take over-the-counter Tylenol Cold if needed, she is instructed to use normal saline as a nasal rinse, she is to follow-up with her OB/GYN if she is not better in 3-5 days, she is to return to the emergency department if she is worsening, patient states she understands and will comply, she was discharged in stable condition      ____________________________________________   FINAL CLINICAL IMPRESSION(S) / ED DIAGNOSES  Final diagnoses:  Acute upper respiratory infection      NEW MEDICATIONS STARTED DURING THIS VISIT:  This SmartLink is deprecated. Use AVSMEDLIST instead to display the medication list for a patient.   Note:  This document was prepared using Dragon voice recognition software and may include unintentional dictation errors.    Faythe GheeFisher, Farren Landa W, PA-C 07/07/17 1102    Governor RooksLord, Rebecca, MD 07/07/17 [phone]

## 2017-07-08 ENCOUNTER — Observation Stay
Admission: EM | Admit: 2017-07-08 | Discharge: 2017-07-08 | Disposition: A | Discharge status: Home / Self Care | Payer: Medicaid Other | Source: Home / Self Care | Admitting: Obstetrics and Gynecology

## 2017-07-08 ENCOUNTER — Observation Stay
Admission: EM | Admit: 2017-07-08 | Discharge: 2017-07-09 | Disposition: A | Discharge status: Home / Self Care | Payer: Medicaid Other | Source: Home / Self Care | Admitting: Obstetrics and Gynecology

## 2017-07-08 DIAGNOSIS — O479 False labor, unspecified: Secondary | ICD-10-CM | POA: Diagnosis present

## 2017-07-08 DIAGNOSIS — Z3A33 33 weeks gestation of pregnancy: Secondary | ICD-10-CM

## 2017-07-08 DIAGNOSIS — O4700 False labor before 37 completed weeks of gestation, unspecified trimester: Secondary | ICD-10-CM | POA: Diagnosis present

## 2017-07-08 DIAGNOSIS — O4703 False labor before 37 completed weeks of gestation, third trimester: Secondary | ICD-10-CM

## 2017-07-08 DIAGNOSIS — O09219 Supervision of pregnancy with history of pre-term labor, unspecified trimester: Principal | ICD-10-CM

## 2017-07-08 DIAGNOSIS — R112 Nausea with vomiting, unspecified: Secondary | ICD-10-CM | POA: Diagnosis present

## 2017-07-08 DIAGNOSIS — O09213 Supervision of pregnancy with history of pre-term labor, third trimester: Secondary | ICD-10-CM | POA: Diagnosis not present

## 2017-07-08 DIAGNOSIS — O09899 Supervision of other high risk pregnancies, unspecified trimester: Secondary | ICD-10-CM

## 2017-07-08 LAB — CBC
HCT: 35.4 % (ref 35.0–47.0)
HEMOGLOBIN: 12.4 g/dL (ref 12.0–16.0)
MCH: 32.9 pg (ref 26.0–34.0)
MCHC: 35 g/dL (ref 32.0–36.0)
MCV: 94 fL (ref 80.0–100.0)
Platelets: 267 10*3/uL (ref 150–440)
RBC: 3.77 MIL/uL — ABNORMAL LOW (ref 3.80–5.20)
RDW: 13.1 % (ref 11.5–14.5)
WBC: 12.6 10*3/uL — ABNORMAL HIGH (ref 3.6–11.0)

## 2017-07-08 LAB — FETAL FIBRONECTIN: Fetal Fibronectin: NEGATIVE

## 2017-07-08 LAB — WET PREP, GENITAL
Clue Cells Wet Prep HPF POC: NONE SEEN
Sperm: NONE SEEN
YEAST WET PREP: NONE SEEN

## 2017-07-08 LAB — CHLAMYDIA/NGC RT PCR (ARMC ONLY)
CHLAMYDIA TR: NOT DETECTED
N GONORRHOEAE: NOT DETECTED

## 2017-07-08 LAB — TYPE AND SCREEN
ABO/RH(D): O POS
Antibody Screen: NEGATIVE

## 2017-07-08 MED ORDER — ONDANSETRON 4 MG PO TBDP
ORAL_TABLET | ORAL | Status: AC
  Filled 2017-07-08: qty 1

## 2017-07-08 MED ORDER — ONDANSETRON HCL 4 MG/2ML IJ SOLN: 4.0000 mg | Freq: Four times a day (QID) | INTRAMUSCULAR | Status: DC | PRN

## 2017-07-08 MED ORDER — LACTATED RINGERS IV SOLN
500.0000 mL | INTRAVENOUS | Status: DC | PRN
  Administered 2017-07-08: 500 mL via INTRAVENOUS

## 2017-07-08 MED ORDER — OXYTOCIN BOLUS FROM INFUSION: 500.0000 mL | Freq: Once | INTRAVENOUS | Status: DC

## 2017-07-08 MED ORDER — NIFEDIPINE 10 MG PO CAPS: 30.0000 mg | ORAL_CAPSULE | ORAL | Status: DC

## 2017-07-08 MED ORDER — ONDANSETRON 4 MG PO TBDP
4.0000 mg | ORAL_TABLET | ORAL | Status: DC | PRN
  Administered 2017-07-08: 4 mg via ORAL

## 2017-07-08 MED ORDER — BETAMETHASONE SOD PHOS & ACET 6 (3-3) MG/ML IJ SUSP
12.0000 mg | INTRAMUSCULAR | Status: DC
  Administered 2017-07-08: 12 mg via INTRAMUSCULAR

## 2017-07-08 MED ORDER — LACTATED RINGERS IV SOLN
INTRAVENOUS | Status: DC
  Administered 2017-07-08: via INTRAVENOUS

## 2017-07-08 MED ORDER — METRONIDAZOLE 500 MG PO TABS
2000.0000 mg | ORAL_TABLET | ORAL | Status: AC
  Administered 2017-07-08: 2000 mg via ORAL
  Filled 2017-07-08: qty 4

## 2017-07-08 MED ORDER — LIDOCAINE HCL (PF) 1 % IJ SOLN: 30.0000 mL | INTRAMUSCULAR | Status: DC | PRN

## 2017-07-08 MED ORDER — BETAMETHASONE SOD PHOS & ACET 6 (3-3) MG/ML IJ SUSP
12.0000 mg | Freq: Once | INTRAMUSCULAR | Status: AC
  Administered 2017-07-08: 12 mg via INTRAMUSCULAR
  Filled 2017-07-08: qty 2

## 2017-07-08 MED ORDER — NIFEDIPINE 10 MG PO CAPS: 20.0000 mg | ORAL_CAPSULE | Freq: Four times a day (QID) | ORAL | Status: DC

## 2017-07-08 MED ORDER — BETAMETHASONE SOD PHOS & ACET 6 (3-3) MG/ML IJ SUSP
INTRAMUSCULAR | Status: AC
  Administered 2017-07-08: 12 mg via INTRAMUSCULAR
  Filled 2017-07-08: qty 1

## 2017-07-08 MED ORDER — SOD CITRATE-CITRIC ACID 500-334 MG/5ML PO SOLN: 30.0000 mL | ORAL | Status: DC | PRN

## 2017-07-08 MED ORDER — OXYTOCIN 10 UNIT/ML IJ SOLN: 10.0000 [IU] | Freq: Once | INTRAMUSCULAR | Status: DC

## 2017-07-08 MED ORDER — VANCOMYCIN HCL IN DEXTROSE 1-5 GM/200ML-% IV SOLN
1000.0000 mg | Freq: Two times a day (BID) | INTRAVENOUS | Status: DC
  Administered 2017-07-08: 1000 mg via INTRAVENOUS
  Filled 2017-07-08 (×2): qty 200

## 2017-07-08 MED ORDER — OXYTOCIN 40 UNITS IN LACTATED RINGERS INFUSION - SIMPLE MED: 2.5000 [IU]/h | INTRAVENOUS | Status: DC

## 2017-07-08 MED ORDER — NIFEDIPINE 10 MG PO CAPS
30.0000 mg | ORAL_CAPSULE | ORAL | Status: AC
  Administered 2017-07-08: 30 mg via ORAL
  Filled 2017-07-08: qty 3

## 2017-07-08 NOTE — OB Triage Note (Signed)
Pt is a G3P1 at 4138w0d that presents from ED c/o ctx that started 06/07/17 at 11am. Pt also c/o back pain that started 07/08/17 at 1100 that rates 6/10 and pain and described as aching. Pt denies VB, LOF and sates positive FM. Monitors applied and assessing.

## 2017-07-08 NOTE — Discharge Summary (Signed)
Physician Final Progress Note  Patient ID: Molly Bray MRN: [id number] DOB/AGE: 22/02/1996 22 y.o.  Admit date: 07/07/2017 Admitting provider: Conard NovakStephen D Gianlucca Szymborski, MD Discharge date: 07/08/2017   Admission Diagnoses:  1) intrauterine pregnancy at 2540w6d 2) History of preterm delivery, currently pregnant 3) preterm labor  Discharge Diagnoses:  Active Problems:   High-risk pregnancy, young multigravida   History of preterm delivery, currently pregnant   Preterm labor   History of Present Illness: The patient is a 21 y.o. female [phone] at 5055w0d who presents for contractions.  Denies vaginal bleeding and leaking fluid.  Notes +FM.  Hospital Course: Patient admitted at 2cm dilated with change to nearly 3 cm after about 2 hours.  Betamethasone given.    Preterm labor workup notable for trichomonas.  Urinalysis suspicious but not diagnostic of UTI.  Urine culture ordered.  Negative gonorrhea and chlamydia.  GBS collected. fFN negative.  Patient started on nifedipine and vancomycin for GBS unknown with reported anaphylactic reaction to penicillins.  After a couple hours patient removed her IV and belts and wanted discharge. She states her contractions has subsided and she wanted to go home.  She was counseled regarding risk of having her baby at home. She states she will come back for her second BMTZ shot 12 hours after her first so that she doesn't have to come back at midnight on Christmas Eve/Christmas day.  Knowing the risks of preterm labor and delivery at home, she elects to leave Against Medical Advice.  I discussed that I could not say that she was not still laboring. She did not want a cervical check prior to discharge.  Precautions reviewed in detail and she states that she has easy access to return to hospital, if needed.  Patient was treated for trichomonas and she was educated about treatment for her partner.  Next appointment 07/12/17.   Past Medical History:  Diagnosis Date   . Anxiety   . Depression   . Pyelonephritis   . Recurrent UTI     Past Surgical History:  Procedure Laterality Date  . KIDNEY SURGERY    . KIDNEY SURGERY      No current facility-administered medications on file prior to encounter.    Current Outpatient Medications on File Prior to Encounter  Medication Sig Dispense Refill  . azithromycin (ZITHROMAX Z-PAK) 250 MG tablet 2 pills today then 1 pill a day for 4 days (Patient not taking: Reported on 07/07/2017) 6 each 0  . Butalbital-APAP-Caffeine 50-325-40 MG capsule Take 1-2 capsules by mouth every 6 (six) hours as needed for headache. (Patient not taking: Reported on 07/07/2017) 30 capsule 1  . sertraline (ZOLOFT) 50 MG tablet Take 1 tablet (50 mg total) by mouth daily. (Patient not taking: Reported on 07/07/2017) 30 tablet 2    Allergies  Allergen Reactions  . Amoxicillin Anaphylaxis  . Penicillins Swelling        Social History   Socioeconomic History  . Marital status: Legally Separated    Spouse name: Not on file  . Number of children: Not on file  . Years of education: Not on file  . Highest education level: Not on file  Social Needs  . Financial resource strain: Not on file  . Food insecurity - worry: Not on file  . Food insecurity - inability: Not on file  . Transportation needs - medical: Not on file  . Transportation needs - non-medical: Not on file  Occupational History  . Not on file  Tobacco Use  .  Smoking status: Current Every Day Smoker    Packs/day: 1.00    Types: Cigarettes  . Smokeless tobacco: Never Used  Substance and Sexual Activity  . Alcohol use: No  . Drug use: No  . Sexual activity: Yes    Birth control/protection: None  Other Topics Concern  . Not on file  Social History Narrative  . Not on file    Physical Exam: BP 107/75   Pulse 91   Temp 98.2 F (36.8 C) (Oral)   Resp 18   Ht 5\' 6"  (1.676 m)   Wt 170 lb (77.1 kg)   LMP 10/27/2016   BMI 27.44 kg/m   Gen: NAD CV:  RRR Pulm: CTAB Pelvic: last cervix check ~3cm Ext: no e/c/t  Consults: None  Significant Findings/ Diagnostic Studies:  fFN negative Wet prep: Trichomonas Positive, otherwise negative.  Gonorrhea/chlamydia negative.   UA suspicious for but not diagnostic of UTI GBS pending  Procedures:  NST:  Baseline 130 Accels: present Decels: absent Toco: variable, 1 q 10 min by discharge  Administration of betamethasone, dose 1 Administration of nifedipine for tocolysis Treatment for trichomonas  Discharge Condition: unknonwn (patient did not allow exam for stability)  Disposition: 01-Home or Self Care  Diet: Regular diet  Discharge Activity: Activity as tolerated and pelvic rest   Total time spent taking care of this patient: 45 minutes  Signed: Thomasene MohairStephen Javis Abboud, MD  07/08/2017, 4:37 AM

## 2017-07-08 NOTE — Progress Notes (Signed)
RN in room to see pt, states she got tangled up in the cords when trying to go to restroom so she removed IV and EFM because she pressed call bell and did not get a response, RN checked and call bell not sounding at nurses station. Cord plugged in but loose. Pt says she is no longer hurting and pain stopped before she even got the medication to stop the pain. Knows what to look for and can come back to hospital if anything changes, but she would like to go home and rest. Encouraged pt to remain in hospital for continued monitoring given hx of PTD at 24 wks (previous prenatal records indicate 34 wks) and that baby is doing fine and spoiled.   [id number] - Dr Jean RosenthalJackson in room to see pt per her request to speak with provider. Recommendation discussed and stressed importance for pt to remain in hospital for continued monitoring and treatment. Pt states she gets irritable and will be a "difficulty" patient if she has to stay in hospital tonight. Says she knows how to call 911 if something happens while she is at home, has family that can bring her back to hospital. Advised pt about getting next betamethasone injection at noon. Explained partner treatment and s/s to monitor for and return to hospital asap. Next appt 12/28  Pt sign AMA paperwork, seen leaving LDR3, ambulatory off unit with family member at side. Pt smiling and talking with ease to family. No apparent distress noted.  Nursing supervisor, C.Moore notified of pt leaving AMA.

## 2017-07-08 NOTE — H&P (Addendum)
OB History & Physical   History of Present Illness:  Chief Complaint: contractions  HPI:  Molly Bray is a 21 y.o. [phone] female at 4043w0d dated by 7 week ultrasound.  Her pregnancy has been complicated by history of preterm delivery (patient states this occurred at 24 weeks, however, our records indicate 34 weeks).  The patient started 17OHP, but stopped taking this at 21 weeks.  She has been evaluated by [facility] for a prominent oropharynx and this was noted to be normal on 11/29 by [facility].  She was also recently started on Zoloft about 1 weeks ago. She was seen earlier in the ER today and was diagnosed with an acute upper respiratory infection and was prescribed azithromycin.   She reports contractions.   She denies leakage of fluid.   She denies vaginal bleeding.   She reports fetal movement.    Maternal Medical History:   Past Medical History:  Diagnosis Date  . Anxiety   . Depression   . Pyelonephritis   . Recurrent UTI     Past Surgical History:  Procedure Laterality Date  . KIDNEY SURGERY    . KIDNEY SURGERY      Allergies  Allergen Reactions  . Amoxicillin Anaphylaxis  . Penicillins Swelling    Has patient had a PCN reaction causing immediate rash, facial/tongue/throat swelling, SOB or lightheadedness with hypotension: Yes Has patient had a PCN reaction causing severe rash involving mucus membranes or skin necrosis: No Has patient had a PCN reaction that required hospitalization No     Prior to Admission medications   Medication Sig Start Date End Date Taking? Authorizing Provider  azithromycin (ZITHROMAX Z-PAK) 250 MG tablet 2 pills today then 1 pill a day for 4 days Patient not taking: Reported on 07/07/2017 07/07/17   Faythe GheeFisher, Susan W, PA-C  Butalbital-APAP-Caffeine [id number] MG capsule Take 1-2 capsules by mouth every 6 (six) hours as needed for headache. Patient not taking: Reported on 07/07/2017 06/28/17   Tresea MallGledhill, Jane, CNM  sertraline (ZOLOFT)  50 MG tablet Take 1 tablet (50 mg total) by mouth daily. Patient not taking: Reported on 07/07/2017 06/28/17   Tresea MallGledhill, Jane, CNM    OB History  Gravida Para Term Preterm AB Living  3 1   1 1 1   SAB TAB Ectopic Multiple Live Births  1       1    # Outcome Date GA Lbr Len/2nd Weight Sex Delivery Anes PTL Lv  3 Current           2 Preterm 01/06/13 6866w4d  5 lb 4 oz (2.381 kg) M Vag-Spont  Y LIV  1 SAB               Prenatal care site: [facility]  Social History: She  reports that she has been smoking cigarettes.  She has been smoking about 1.00 pack per day. she has never used smokeless tobacco. She reports that she does not drink alcohol or use drugs.  Family History: She denies a family of gynecologic cancers  Review of Systems: Negative x 10 systems reviewed except as noted in the HPI.    Physical Exam:  Vital Signs: BP 121/82 (BP Location: Left Arm)   Pulse (!) 102   Resp 18   Ht 5\' 6"  (1.676 m)   Wt 170 lb (77.1 kg)   LMP 10/27/2016   BMI 27.44 kg/m  Constitutional: Well nourished, well developed female in no acute distress.  HEENT: normal Skin: Warm  and dry.  Cardiovascular: Regular rate and rhythm.   Extremity: no edema  Respiratory: Clear to auscultation bilateral. Normal respiratory effort Abdomen: FHT present and gravid, NT Back: no CVAT Neuro: DTRs 2+, Cranial nerves grossly intact Psych: Alert and Oriented x3. No memory deficits. Normal mood and affect.  MS: normal gait, normal bilateral lower extremity ROM/strength/stability.  Pelvic exam: (female chaperone present) is not limited by body habitus EGBUS: within normal limits Vagina: within normal limits and with no blood in the vault Cervix: 2/50/-2 at 1153pm.   Pertinent Results:  Prenatal Labs: Blood type/Rh O positive  Antibody screen negative  Rubella Immune  Varicella Immune    RPR NR  HBsAg negative  HIV negative  GC negative  Chlamydia negative  Genetic screening 1st trimester  screen negative, no msAFP performed  1 hour GTT 67  3 hour GTT n/a  GBS uknown   Wet Prep: Yeast: negative Trichomonas: POSITIVE Clue cells: negative  Gonorrhea: pending Chlamydia: pending  Fetal Fibronectin: NEGATIVE  Baseline FHR: 130 beats/min   Variability: moderate   Accelerations: present   Decelerations: absent Contractions: present frequency: 3 q 10 min Overall assessment: category 1  Bedside Ultrasound:  Number of Fetus: 1  Presentation: cephalic  Fluid: subjectively normal  Placental Location: anterior/right  Assessment:  Molly Bray is a 21 y.o. [phone] female at 5219w0d with preterm labor, trichomonas vaginalis.   Plan:  1. Admit to Labor & Delivery  2. CBC, T&S, Clrs, IVF 3. GBS unknown - vancomycin 1 gram Q 12 hours (anaphylactic reaction to PCNs).   4. Fetwal well-being: reassuring 5. Preterm labor:   1. BMTZ - patient accepts 2. Tocolysis: patient has adamantly refused any form of tocolysis. She states that is she is in labor, then she would like to go ahead and have the baby. I discussed with her that the most important benefit of tocolysis is to DELAY delivery in order to get the maximum benefit of steroid administration, including reduction in RDS, NEC, intracranial hemorrhage and DEATH.  In the presence of the RN I reiterated that based on the best available evidence, there is an increased risk of death to the fetus, long-term pulmonary issues, possible complications like NEC (which would require surgery), and complications from intracranial hemorrhage. She voiced that she understood all of these risks and that even with these risks she still declines tocolysis of any form. She is willing to accept BMTZ and get whatever benefit it confers without tocolysis, understanding that if she gives birth soon the benefit might be minimal to zero. 3. Fetal fibronectin - negative 6. Trichomonas: will give flagyl 2,000 grams PO x 1 and will make sure she  understands that her partner needs to be treated, as well.   Thomasene MohairStephen Aneeka Bowden, MD 07/08/2017 1:00 AM

## 2017-07-08 NOTE — H&P (Signed)
OB History & Physical   History of Present Illness:  Chief Complaint:  Worsening contractions-coming every 2 minutes  HPI: Molly Bray is a 21 y.o. [phone] female with EDC=08/26/2017 at 1788w0d dated by 7 week ultrasound. She was admitted earlier this Am for preterm labor. She was treated for a Trichimonal infection with Flagyl and given nifedipine x 1 dose when her cervix changed from 2cm to near 3 cm dilation. FFN was negative.  A GBS culture was also obtained and she was given a dose of Vancomycin. She signed out AMA after a couple hours, but did return this afternoon for a second dose of betamethasone at which time she was not contracting and her cervix was 2.5/60%/-2/posterior. Her contractions resumed this evening and she reports the contractions coming every 2 minutes PTA. She has been eating and drinking this evening without nausea and vomiting. She denies bleeding or leakage of fluid. Baby moving well.  Her pregnancy has been complicated by history of preterm delivery (patient states this occurred at 24 weeks, however, our records indicate 34 weeks).   The patient started 17OHP, but stopped taking this at 21 weeks.  She has been evaluated by [facility] for a prominent oropharynx and this was noted to be normal on 11/29 by [facility].  She was prescribed Zoloft about 1 week ago but has not started the medication. She was seen earlier in the ER yesterday and was diagnosed with an acute upper respiratory infection and was prescribed azithromycin (has not picked up)    Prenatal care [facility]: [facility]   Clinic [facility] Prenatal Labs  Dating 7 week US Blood type: O pos  Genetic Screen (Desires) 1 Screen: negative AFP:  Antibody:negative  Anatomic US Prominent oropharynx-follow up scan by DP WNL Rubella: Immune Varicella: Immune  GTT Third trimester: 67 RPR: NR  Rhogam  HBsAg: negative  TDaP vaccine Flu Shot: HIV: negative  Baby Food Breast  GBS:    Contraception Mirena Pap: NIL 01/09/17  CBB   Cervical Lengths starting at 16 weeks  CS/VBAC    Support Person                    Maternal Medical History:   Past Medical History:  Diagnosis Date  . Anxiety   . Depression   . Pyelonephritis   . Recurrent UTI     Past Surgical History:  Procedure Laterality Date  . KIDNEY SURGERY    . KIDNEY SURGERY     Allergies: Penicillin and Amoxicillin: Anaphylaxis  Prior to Admission medications   Medication Sig Start Date End Date Taking? Authorizing Provider  azithromycin (ZITHROMAX Z-PAK) 250 MG tablet 2 pills today then 1 pill a day for 4 days Patient not taking: Reported on 07/07/2017 07/07/17   Faythe GheeFisher, Susan W, PA-C  Butalbital-APAP-Caffeine [id number] 120150-325-40 MG capsule Take 1-2 capsules by mouth every 6 (six) hours as needed for headache. Patient not taking: Reported on 07/07/2017 06/28/17   Tresea MallGledhill, Jane, CNM  sertraline (ZOLOFT) 50 MG tablet Take 1 tablet (50 mg total) by mouth daily. Patient not taking: Reported on 07/07/2017 06/28/17   Tresea MallGledhill, Jane, CNM       Social History: She  reports that she has been smoking cigarettes.  She has been smoking about 0.50 packs per day. she has never used smokeless tobacco. She reports that she does not drink alcohol or use drugs.  Family History: family history is not on file.   Review  of Systems: Negative x 10 systems reviewed except as noted in the HPI.      Physical Exam:  Vital Signs: BP 118/67 (BP Location: Left Arm)   Pulse (!) 118   Temp 98.9 F (37.2 C) (Oral)   Resp 20   Ht 5\' 6"  (1.676 m)   Wt 77.1 kg (170 lb)   LMP 10/27/2016   BMI 27.44 kg/m  General: no acute distress, appears comfortable.  Abdomen: soft, gravid, non-tender;  EFW: 4#8oz Pelvic:   External: Normal external female genitalia  Cervix: Dilation: 3 / Effacement (%): 60 / Station: -2 changed to 3/75%/-1 over one hour. Extremities: non-tender, symmetric, trace pedal edema  bilaterally.    Neurologic: Alert & oriented x 3.   FHR: 125 baseline with accelerations to 160s to 170, moderate variability Toco: initially contractions every 2-8 min apart, but over the last 30 minutes they are every 3-4 min apart, palpating mild.  Assessment:  Molly Bray is a 21 y.o. [phone] female at 5239w0d with threatened preterm labor. No change in effacement and position of cervix FWB: Cat 1 tracing  Plan:  1. Admit to Labor & Delivery for observation 2. Long discussion with patient regarding treatment for preterm labor and risks of prematurity for baby. She declines any tocolytics. Will try to po hydrate. Has already received 2 doses of betamethasone. 3. GBS pending-will treat with Vancomycin if she progresses past 3 cm.  4. Consents obtained. 5.    NICU notified. 6.    UDS ordered  Molly Bray  07/08/2017 10:48 PM

## 2017-07-08 NOTE — Final Progress Note (Signed)
Physician Final Progress Note  Patient ID: Molly Bray MRN: [id number] DOB/AGE: 22/02/1996 22 y.o.  Admit date: 07/08/2017 Admitting provider: Conard NovakStephen D Jackson, MD Discharge date: 07/08/2017   Admission Diagnoses: IUP at 33 weeks History of preterm labor and delivery, currently pregnant S/p episode of preterm labor  Discharge Diagnoses:  IUP at 33 weeks History of preterm labor and delivery S/p episode of preterm labor Episode of nausea and vomiting x 1  Consults: None  Significant Findings/ Diagnostic Studies: 21 year old G3 P0111 at 33 weeks who was seen earlier this AM for preterm labor. She presents for her second dose of betamethasone and reports lower back pain and abdominal pain. She also vomited once after drinking some water. Complains of reflux and PND/URI symptoms. Has not started the Z-PAK she was prescribed yesterday. Is not taking anything OTC for URI symptoms. Taking Tums for heartburn. Did not eat after taking the Flagyl this morning.  Earlier this AM she was treated for a Trichimonal infection with Flagyl and given nifedipine x 1 dose when her cervix changed from 2cm to near 3 cm dilation. FFN was negative.  A GBS culture was also obtained and she was given a dose of Vancomycin. She signed out AMA after a couple hours and was instructed to come back for her second injection today.  Exam: General: WF in NAD BP 119/71   Pulse 91   Temp 98.1 F (36.7 C) (Oral)   Resp 16   Ht 5\' 6"  (1.676 m)   Wt 170 lb (77.1 kg)   LMP 10/27/2016   BMI 27.44 kg/m   Heart: RRR without murmur Lungs: CTAB Abdomen: soft, NT, cephalic on Leopold's FHR: 130 with accelerations to 160s to 190, moderate to marked variability Toco: no contractions seen, some uterine irritability Cervix: 2.5/60%/-2  A: IUP at 33 weeks with threatened preterm labor earlier this AM No cervical change Not contracting at this time One episode of vomiting: that responded to one dose of  Zofran Patient requested discharge after able to keep down some water Cat 1 FHR tracing  P: DC home with labor precautions after receiving her second dose of betamethasone Offered RX for reflux and declined. [facility].   Procedures: none  Discharge Condition: stable  Disposition: home Diet: Regular diet  Discharge Activity: Activity as tolerated  Discharge Instructions    Discharge patient   Complete by:  As directed    Discharge disposition:  01-Home or Self Care   Discharge patient date:  07/08/2017        Total time spent taking care of this patient: 15 minutes  Signed: Farrel Connersolleen Valente Fosberg 07/08/2017, 1:51 PM

## 2017-07-08 NOTE — Progress Notes (Signed)
Patient ID: Molly Bray, female   DOB: 08/27/1995, 21 y.o.   MRN: [id number]  Labor Check  Subj:  Complaints: still feeling ctx   Obj:  BP 114/70   Pulse 80   Resp 18   Ht 5\' 6"  (1.676 m)   Wt 170 lb (77.1 kg)   LMP 10/27/2016   BMI 27.44 kg/m     Cervix: 3/50/-3 (about a 0.5 cm change from earlier) Baseline FHR: 130 beats/min   Variability: moderate   Accelerations: present   Decelerations: absent Contractions: present frequency: 1-2 q 10 min per toco Overall assessment: category 1  A/P: 21 y.o. [id number] female at 7325w0d with preterm labor.  1.  Preterm Labor: now accepting nifedipine, at least short-term.  She states she may still leave later this morning, if no more cervical change. 2.  FWB: reassuring, Overall assessment: category 1  3.  GBS unknown - vancomycin per CDC guidelines  4.  Pain: prn 5.  Recheck: as needed or in several hours 6.  Trichomonas - flagyl 2,000 mg po x 1, patient aware.  Gonorrhea and trichomonas negative 7.  Patient still very much wants to leave, but is accepting of a plan of further monitoring at this time.  She states that tomorrow is christmas for her and her son and it is important for her to be with him tomorrow.  I voiced understanding of this need.  I reiterated that the main concern I have to have at this point is the safety and health of this fetus at this time and that going home when unstable is STRONGLY not recommended. She will consider and we will reassess in several hours or as needed.   Thomasene MohairStephen Romaine Maciolek, MD 07/08/2017 2:27 AM

## 2017-07-08 NOTE — OB Triage Note (Signed)
Presents with back pain and contractions since 1900 today. efm and toco applied fhr-stable.  Abdomen soft, non-tender.  Midwife at desk on unit.  uds collected..Marland Kitchen

## 2017-07-09 DIAGNOSIS — Z3A33 33 weeks gestation of pregnancy: Secondary | ICD-10-CM

## 2017-07-09 DIAGNOSIS — O09213 Supervision of pregnancy with history of pre-term labor, third trimester: Secondary | ICD-10-CM | POA: Diagnosis not present

## 2017-07-09 LAB — RPR: RPR: NONREACTIVE

## 2017-07-09 LAB — URINE DRUG SCREEN, QUALITATIVE (ARMC ONLY)
BARBITURATES, UR SCREEN: NOT DETECTED
MDMA (Ecstasy)Ur Screen: NOT DETECTED
Methadone Scn, Ur: NOT DETECTED
TRICYCLIC, UR SCREEN: NOT DETECTED

## 2017-07-09 LAB — URINE CULTURE

## 2017-07-09 LAB — CULTURE, BETA STREP (GROUP B ONLY)

## 2017-07-09 MED ORDER — ZOLPIDEM TARTRATE 5 MG PO TABS: 5.0000 mg | ORAL_TABLET | Freq: Every evening | ORAL | Status: DC | PRN

## 2017-07-09 MED ORDER — ZOLPIDEM TARTRATE 5 MG PO TABS
ORAL_TABLET | ORAL | Status: AC
  Administered 2017-07-09: 5 mg
  Filled 2017-07-09: qty 1

## 2017-07-09 NOTE — Discharge Instructions (Signed)

## 2017-07-09 NOTE — Final Progress Note (Signed)
Physician Final Progress Note  Patient ID: Molly Bray MRN: [id number] DOB/AGE: 22/02/1996 22 y.o.  Admit date: 07/08/2017 Admitting provider: Conard NovakStephen D Jackson, MD Discharge date: 07/09/2017   Admission Diagnoses: IUP at 33 weeks with threatened preterm labor  Discharge Diagnoses:  IUP at 33 weeks with threatened preterm labor  Consults: None  Significant Findings/ Diagnostic Studies: 21 year old G3 P0111 with EDC= 08/26/2017  at 33wk 1 day who was monitored for cervical change after presenting with complaints of contractions every 2 min.with back pain which she rated 7-8/10. This was the third presentation in the last 24 hours for similar complaints. Initially there was cervical change from previous visit this afternoon. Cervix changed from 2.5 cm to 3 cm/60%/-2/posterior. Then after an hour there was change in effacement and position of the cervix(75%/mid position.) Patient declined any tocolytics.. She was monitored for 2 more hours and was discharge when there was no further cervical change. The contraction became less intense and patient rated them a 1-2/10 on discharge. Most contractions were 30 sec duration or less. The fetal heart tracing was Cat 1. She had received 2 doses of betamethasone 12 hours apart on prior visits 07/08/2017. She was discharged with preterm labor precautions  Procedures: none  Discharge Condition: stable  Disposition: 01-Home or Self Care  Diet: Regular diet  Discharge Activity: Activity as tolerated. Offered to take patient out of work or decrease hours at work and she declined  [facility] N[facility]  Total time spent taking care of this patient: 20 minutes  Signed: Farrel Connersolleen Brucha Ahlquist 07/09/2017, 12:46 AM

## 2017-07-10 ENCOUNTER — Telehealth: Payer: Self-pay | Admitting: Obstetrics and Gynecology

## 2017-07-10 NOTE — Telephone Encounter (Signed)
Patient was seen at the hospital 07-08-11-26 and would like to get a note for work. Patient didn't make it to work today as well. Pt cb# [phone]

## 2017-07-12 ENCOUNTER — Ambulatory Visit (INDEPENDENT_AMBULATORY_CARE_PROVIDER_SITE_OTHER): Payer: Medicaid Other | Admitting: Advanced Practice Midwife

## 2017-07-12 VITALS — BP 100/60 | Wt 167.0 lb

## 2017-07-12 DIAGNOSIS — Z3A33 33 weeks gestation of pregnancy: Secondary | ICD-10-CM

## 2017-07-12 NOTE — Progress Notes (Signed)
Routine Prenatal Care Visit  Subjective  Molly Bray is a 21 y.o. [phone] at 2131w4d being seen today for ongoing prenatal care.  She is currently monitored for the following issues for this high-risk pregnancy and has High-risk pregnancy, young multigravida; History of preterm delivery, currently pregnant; Suspected fetal anomaly, antepartum; Decreased fetal movement; Preterm labor; Nausea & vomiting; and Preterm contractions on their problem list.  ----------------------------------------------------------------------------------- Patient reports pelvic pressure, insomnia.  Discussion of comfort measures and safe medications. She has increased her hydration. She has been having trouble filling prescriptions due to problem with Medicaid system. She has not gotten the z-pack but she is taking the fioricet with some relief. She had not yet started the zoloft due to concern of drowsiness in the daytime and wonders if it is ok to take at bedtime. She has had some improvement in her sinus symptoms. Contractions: Not present. Vag. Bleeding: None.  Movement: Present. Denies leaking of fluid.  ----------------------------------------------------------------------------------- The following portions of the patient's history were reviewed and updated as appropriate: allergies, current medications, past family history, past medical history, past social history, past surgical history and problem list. Problem list updated.   Objective  Blood pressure 100/60, weight 167 lb (75.8 kg), last menstrual period 10/27/2016. Pregravid weight 157 lb (71.2 kg) Total Weight Gain 10 lb (4.536 kg) Urinalysis: Urine Protein: Negative Urine Glucose: Negative  Fetal Status: Fetal Heart Rate (bpm): 145 Fundal Height: 33 cm Movement: Present     General:  Alert, oriented and cooperative. Patient is in no acute distress.  Skin: Skin is warm and dry. No rash noted.   Cardiovascular: Normal heart rate noted    Respiratory: Normal respiratory effort, no problems with respiration noted  Abdomen: Soft, gravid, appropriate for gestational age. Pain/Pressure: Present     Pelvic:  Cervical exam deferred        Extremities: Normal range of motion.     Mental Status: Normal mood and affect. Normal behavior. Normal judgment and thought content.   Assessment   21 y.o. [id number] at 3831w4d by  08/26/2017, Alternate EDD Entry presenting for routine prenatal visit  Plan   pregnancy #3 Problems (from 10/27/16 to present)    Problem Noted Resolved   High-risk pregnancy, young multigravida 01/09/2017 by Vena AustriaStaebler, Andreas, MD No   Overview Addendum 06/17/2017  1:32 PM by Farrel ConnersGutierrez, Colleen, CNM    [facility] Prenatal Labs  Dating 7 week US Blood type: O pos  Genetic Screen (Desires) 1 Screen: negative   AFP:      Antibody:negative  Anatomic US Prominent oropharynx-follow up scan by DP WNL Rubella: Immune Varicella: Immune  GTT Third trimester: 67 RPR: NR  Rhogam  HBsAg: negative  TDaP vaccine  12/14 Flu Shot: HIV: negative  Baby Food      Breast                          GBS:   Contraception Mirena Pap: NIL 01/09/17  CBB   Cervical Lengths starting at 16 weeks  CS/VBAC    Support Person                   Preterm labor symptoms and general obstetric precautions including but not limited to vaginal bleeding, contractions, leaking of fluid and fetal movement were reviewed in detail with the patient.  Encouraged continued increased hydration [facility] May not need z-pack if symptoms improve   Return in about 2 weeks (  around 07/26/2017) for rob.  Tresea MallJane Aliyanna Wassmer, CNM  07/12/2017 9:54 AM

## 2017-07-12 NOTE — Telephone Encounter (Signed)
Pt aware via vm note up front

## 2017-07-16 HISTORY — PX: OTHER SURGICAL HISTORY: SHX169

## 2017-07-16 NOTE — L&D Delivery Note (Signed)
Date of delivery: 08/12/2017 Estimated Date of Delivery: 08/26/17 Patient's last menstrual period was 10/27/2016. EGA: 441w0d  Delivery Note At 3:22 PM a viable female was delivered via Vaginal, Spontaneous (Presentation: OA;  LOA).  APGAR: 8, 9; weight 7 lb 8.6 oz (3420 g).   Placenta status: spontaneous, intact.   Cord:  with the following complications: none.  Cord pH: NA  Called to see patient.  Mom pushed to deliver a viable female infant.  The head followed by shoulders, which delivered without difficulty, and the rest of the body.  No nuchal cord noted.  Baby to mom's chest.  Cord clamped and cut after 3 min delay.  Cord blood obtained.  Placenta delivered spontaneously, intact, with a 3-vessel cord.  Perineum intact.  All counts correct.  Hemostasis obtained with IV pitocin and fundal massage.     Anesthesia: epidural  Episiotomy: None Lacerations: None Suture Repair: NA Est. Blood Loss (mL): 450  Mom to postpartum.  Baby to Couplet care / Skin to Skin.  Tresea MallJane Franci Oshana, CNM 08/12/2017, 3:54 PM

## 2017-07-26 ENCOUNTER — Encounter: Payer: Self-pay | Admitting: Obstetrics and Gynecology

## 2017-07-26 ENCOUNTER — Ambulatory Visit (INDEPENDENT_AMBULATORY_CARE_PROVIDER_SITE_OTHER): Payer: Medicaid Other | Admitting: Obstetrics and Gynecology

## 2017-07-26 VITALS — BP 110/64 | Wt 168.0 lb

## 2017-07-26 DIAGNOSIS — O09899 Supervision of other high risk pregnancies, unspecified trimester: Secondary | ICD-10-CM

## 2017-07-26 DIAGNOSIS — O4700 False labor before 37 completed weeks of gestation, unspecified trimester: Secondary | ICD-10-CM

## 2017-07-26 DIAGNOSIS — Z3A35 35 weeks gestation of pregnancy: Secondary | ICD-10-CM

## 2017-07-26 DIAGNOSIS — O09623 Supervision of young multigravida, third trimester: Secondary | ICD-10-CM

## 2017-07-26 DIAGNOSIS — O09219 Supervision of pregnancy with history of pre-term labor, unspecified trimester: Secondary | ICD-10-CM

## 2017-07-26 DIAGNOSIS — O479 False labor, unspecified: Secondary | ICD-10-CM

## 2017-07-26 DIAGNOSIS — O359XX0 Maternal care for (suspected) fetal abnormality and damage, unspecified, not applicable or unspecified: Secondary | ICD-10-CM

## 2017-07-26 NOTE — Progress Notes (Signed)
  Routine Prenatal Care Visit  Subjective  Molly Bray is a 22 y.o. [phone] at 742w4d being seen today for ongoing prenatal care.  She is currently monitored for the following issues for this high-risk pregnancy and has High-risk pregnancy, young multigravida; History of preterm delivery, currently pregnant; Suspected fetal anomaly, antepartum; Decreased fetal movement; Preterm labor; Nausea & vomiting; and Preterm contractions on their problem list.  ----------------------------------------------------------------------------------- Patient reports mild ctx, none now.  headache.  overall doing about he same.   Contractions: Not present. Vag. Bleeding: None.  Movement: Present. Denies leaking of fluid.  ----------------------------------------------------------------------------------- The following portions of the patient's history were reviewed and updated as appropriate: allergies, current medications, past family history, past medical history, past social history, past surgical history and problem list. Problem list updated.   Objective  Blood pressure 110/64, weight 168 lb (76.2 kg), last menstrual period 10/27/2016. Pregravid weight 157 lb (71.2 kg) Total Weight Gain 11 lb (4.99 kg) Urinalysis: Urine Protein: Trace Urine Glucose: Negative  Fetal Status: Fetal Heart Rate (bpm): 140 Fundal Height: 35 cm Movement: Present     General:  Alert, oriented and cooperative. Patient is in no acute distress.  Skin: Skin is warm and dry. No rash noted.   Cardiovascular: Normal heart rate noted  Respiratory: Normal respiratory effort, no problems with respiration noted  Abdomen: Soft, gravid, appropriate for gestational age. Pain/Pressure: Absent     Pelvic:  Cervical exam performed Dilation: 3 Effacement (%): 50 Station: -3  Extremities: Normal range of motion.     Mental Status: Normal mood and affect. Normal behavior. Normal judgment and thought content.   Assessment   22 y.o.  [id number] at 512w4d by  08/26/2017, Alternate EDD Entry presenting for routine prenatal visit  Plan   pregnancy #3 Problems (from 10/27/16 to present)    Problem Noted Resolved   High-risk pregnancy, young multigravida 01/09/2017 by Vena AustriaStaebler, Andreas, MD No   Overview Addendum 06/17/2017  1:32 PM by Farrel ConnersGutierrez, Colleen, CNM    [facility] Prenatal Labs  Dating 7 week US Blood type: O pos  Genetic Screen (Desires) 1 Screen: negative   AFP:      Antibody:negative  Anatomic US Prominent oropharynx-follow up scan by DP WNL Rubella: Immune Varicella: Immune  GTT Third trimester: 67 RPR: NR  Rhogam  HBsAg: negative  TDaP vaccine                       Flu Shot: HIV: negative  Baby Food      Breast                          GBS:   Contraception Mirena Pap: NIL 01/09/17  CBB   Cervical Lengths starting at 16 weeks  CS/VBAC    Support Person                   Preterm labor symptoms and general obstetric precautions including but not limited to vaginal bleeding, contractions, leaking of fluid and fetal movement were reviewed in detail with the patient. Please refer to After Visit Summary for other counseling recommendations.   Return in about 1 week (around 08/02/2017) for Routine Prenatal Appointment.  Thomasene MohairStephen Aylinn Rydberg, MD  07/26/2017 11:11 AM

## 2017-07-29 ENCOUNTER — Observation Stay
Admission: EM | Admit: 2017-07-29 | Discharge: 2017-07-29 | Disposition: A | Discharge status: Home / Self Care | Payer: Medicaid Other | Attending: Certified Nurse Midwife | Admitting: Certified Nurse Midwife

## 2017-07-29 ENCOUNTER — Other Ambulatory Visit: Payer: Self-pay

## 2017-07-29 DIAGNOSIS — O212 Late vomiting of pregnancy: Principal | ICD-10-CM | POA: Insufficient documentation

## 2017-07-29 DIAGNOSIS — F17210 Nicotine dependence, cigarettes, uncomplicated: Secondary | ICD-10-CM | POA: Insufficient documentation

## 2017-07-29 DIAGNOSIS — O99343 Other mental disorders complicating pregnancy, third trimester: Secondary | ICD-10-CM | POA: Insufficient documentation

## 2017-07-29 DIAGNOSIS — F419 Anxiety disorder, unspecified: Secondary | ICD-10-CM | POA: Insufficient documentation

## 2017-07-29 DIAGNOSIS — Z87440 Personal history of urinary (tract) infections: Secondary | ICD-10-CM | POA: Insufficient documentation

## 2017-07-29 DIAGNOSIS — F329 Major depressive disorder, single episode, unspecified: Secondary | ICD-10-CM | POA: Diagnosis not present

## 2017-07-29 DIAGNOSIS — R112 Nausea with vomiting, unspecified: Secondary | ICD-10-CM | POA: Diagnosis present

## 2017-07-29 DIAGNOSIS — O09623 Supervision of young multigravida, third trimester: Secondary | ICD-10-CM

## 2017-07-29 DIAGNOSIS — Z79899 Other long term (current) drug therapy: Secondary | ICD-10-CM | POA: Diagnosis not present

## 2017-07-29 DIAGNOSIS — Z880 Allergy status to penicillin: Secondary | ICD-10-CM | POA: Insufficient documentation

## 2017-07-29 DIAGNOSIS — Z3A36 36 weeks gestation of pregnancy: Secondary | ICD-10-CM

## 2017-07-29 DIAGNOSIS — O99333 Smoking (tobacco) complicating pregnancy, third trimester: Secondary | ICD-10-CM | POA: Insufficient documentation

## 2017-07-29 LAB — CBC
HCT: 36.7 % (ref 35.0–47.0)
HEMOGLOBIN: 12.6 g/dL (ref 12.0–16.0)
MCH: 31.5 pg (ref 26.0–34.0)
MCHC: 34.2 g/dL (ref 32.0–36.0)
MCV: 92.3 fL (ref 80.0–100.0)
PLATELETS: 196 10*3/uL (ref 150–440)
RBC: 3.98 MIL/uL (ref 3.80–5.20)
RDW: 13.2 % (ref 11.5–14.5)
WBC: 8.5 10*3/uL (ref 3.6–11.0)

## 2017-07-29 LAB — COMPREHENSIVE METABOLIC PANEL
ALK PHOS: 250 U/L — AB (ref 38–126)
ALT: 10 U/L — AB (ref 14–54)
ANION GAP: 9 (ref 5–15)
AST: 20 U/L (ref 15–41)
Albumin: 2.8 g/dL — ABNORMAL LOW (ref 3.5–5.0)
BUN: 8 mg/dL (ref 6–20)
CALCIUM: 8.4 mg/dL — AB (ref 8.9–10.3)
CO2: 22 mmol/L (ref 22–32)
CREATININE: 0.57 mg/dL (ref 0.44–1.00)
Chloride: 105 mmol/L (ref 101–111)
Glucose, Bld: 76 mg/dL (ref 65–99)
Potassium: 3.6 mmol/L (ref 3.5–5.1)
Sodium: 136 mmol/L (ref 135–145)
Total Bilirubin: 0.5 mg/dL (ref 0.3–1.2)
Total Protein: 6.2 g/dL — ABNORMAL LOW (ref 6.5–8.1)

## 2017-07-29 LAB — URINALYSIS, COMPLETE (UACMP) WITH MICROSCOPIC
Glucose, UA: NEGATIVE mg/dL
Hgb urine dipstick: NEGATIVE
KETONES UR: 20 mg/dL — AB
Nitrite: NEGATIVE
PROTEIN: 100 mg/dL — AB
Specific Gravity, Urine: 1.029 (ref 1.005–1.030)
pH: 6 (ref 5.0–8.0)

## 2017-07-29 LAB — TYPE AND SCREEN
ABO/RH(D): O POS
Antibody Screen: NEGATIVE

## 2017-07-29 MED ORDER — ONDANSETRON HCL 8 MG PO TABS: 8.0000 mg | ORAL_TABLET | Freq: Three times a day (TID) | ORAL | 0 refills | Status: DC | PRN

## 2017-07-29 MED ORDER — LACTATED RINGERS IV BOLUS (SEPSIS)
500.0000 mL | Freq: Once | INTRAVENOUS | Status: AC
  Administered 2017-07-29: 500 mL via INTRAVENOUS

## 2017-07-29 MED ORDER — LACTATED RINGERS IV SOLN: INTRAVENOUS | Status: DC

## 2017-07-29 MED ORDER — ONDANSETRON HCL 4 MG/2ML IJ SOLN
4.0000 mg | INTRAMUSCULAR | Status: DC | PRN
  Administered 2017-07-29: 4 mg via INTRAVENOUS
  Filled 2017-07-29: qty 2

## 2017-07-29 NOTE — Discharge Instructions (Signed)
May take Claritin, Zyrtec, or Benadryl for congestion.

## 2017-07-29 NOTE — H&P (Signed)
OB History & Physical   History of Present Illness:  Chief Complaint: Complains of nausea, vomiting, HPI:  Molly Bray is a 22 y.o. [phone] female with EDC=08/26/2017 at [redacted]w[redacted]d dated by a 7 week ultrasound.  She presented with complaints of nausea and vomiting, diarrhea, and abdominal pain. She "passed her mucous plug" this AM around 0300. After that she started to have upper abdominal pain and then some lower abdominal pain and pelvic pressure. Has vomited 4-5 times today and has had 4-5 loose stools. No blood in vomitus or stool. Has not vomited in 4-5 hours, but has not tried to drink or eat. Last ate solid food last night before bed: had country fried steak (frozen dinner). She has had nasal congestion, rhinorrhea (clear drainage), and cough since before Christmas and took Azithromycin, but it did not relieve symptoms. Denies fever, dysuria. Also complains of lower back pain, tingling in thighs, and hip pain. Baby moving "less than usual".   Her pregnancy has been complicated byhistory of preterm delivery (patient states this occurred at 24 weeks, however, our records indicate 34 weeks). The patient started 17OHP, but stopped taking this at 21 weeks. Was seen 24 December for threatened PTL and treated for Trichimoniasis, given one dose of nifedipine and one dose of Vancomycin. She also received two doses of betamethasone.  Her cervix in the office on 1/11 was 3/50%/-3. GBS culture 12/23 was negative. She has been evaluated by [facility] for a prominent oropharynx and this was noted to be normal on 11/29 by [facility]. She was prescribed Zoloft about several weeks ago for anxiety and depression but has not started the medication. Prenatal care site: [facility]atal care at [facility] has been remarkable for   [facility] Prenatal Labs  Dating 7 week [place] Blood type: O pos  Genetic Screen (Desires) 1 Screen: negative AFP:  Antibody:negative  Anatomic [place] Prominent oropharynx-follow up scan by DP  WNL Rubella: Immune Varicella: Immune  GTT Third trimester: 67 RPR: NR  Rhogam  HBsAg: negative  TDaP vaccine Flu Shot: HIV: negative  Baby Food Breast  GEX:[id number] 07/07/2017  Contraception Mirena Pap: NIL 01/09/17  CBB   Cervical Lengths starting at 16 weeks  CS/VBAC    Support Person                       Maternal Medical History:   Past Medical History:  Diagnosis Date  . Anxiety   . Depression   . Pyelonephritis   . Recurrent UTI     Past Surgical History:  Procedure Laterality Date  . KIDNEY SURGERY    . KIDNEY SURGERY     Allergies: Penicillin: anaphyllaxis  Prior to Admission medications   Medication Sig Start Date End Date Taking? Authorizing Provider  guaiFENesin (ROBITUSSIN) 100 MG/5ML SOLN Take 5 mLs by mouth every 4 (four) hours as needed for cough or to loosen phlegm.   Yes [provider]  Butalbital-APAP-Caffeine 50-325-40 MG capsule Take 1-2 capsules by mouth every 6 (six) hours as needed for headache. Patient not taking: Reported on 07/07/2017 06/28/17   Tresea Mall, CNM  sertraline (ZOLOFT) 50 MG tablet Take 1 tablet (50 mg total) by mouth daily. Patient not taking: Reported on 07/07/2017 06/28/17   Tresea Mall, CNM     Social History: She  reports that she has been smoking cigarettes.  She has been smoking about 0.50 packs per day. she has never used smokeless tobacco. She reports that she does not  drink alcohol or use drugs.  Family History:  Family History  Problem Relation Age of Onset  . Diabetes Maternal Aunt   . Hypertension Neg Hx   . Ovarian cancer Neg Hx   . Breast cancer Neg Hx   . Heart disease Neg Hx    Review of Systems:  Review of Systems  Constitutional: Negative for chills and fever.  HENT: Positive for congestion. Negative for sore throat.        Positive for rhinorrhea and PND  Respiratory: Positive for cough and sputum production. Negative for shortness of breath and wheezing.    Cardiovascular: Negative for chest pain.  Gastrointestinal: Positive for abdominal pain, diarrhea, nausea and vomiting. Negative for blood in stool.  Genitourinary: Negative for dysuria.  Musculoskeletal: Positive for back pain (sacral).  Skin: Negative for rash.  Neurological: Positive for tingling (bilateral thighs) and headaches.  Psychiatric/Behavioral: The patient is nervous/anxious.      Physical Exam:  Vital Signs: BP 98/68 (BP Location: Left Arm)   Pulse (!) 125   Temp 98 F (36.7 C) (Oral)   Resp 18   Ht 5\' 6"  (1.676 m)   Wt 77.1 kg (170 lb)   LMP 10/27/2016   SpO2 98%   BMI 27.44 kg/m  General: gravid WF no acute distress.  HEENT: normocephalic, atraumatic  Nose: swollen and boggy with exudates  Oropharynx: no inflammation or exudates Heart: tachycardia with regular rhythm.  No murmurs/rubs/gallops Lungs: clear to auscultation bilaterally Abdomen: soft, gravid,tender upper abdomen, +RUQ pain (near fundal area) , bowel sounds active Pelvic:   External: Normal external female genitalia, no inflammation or lesions  Vagina: no bleeding or masses  Cervix: Dilation: 3 / Effacement (%): 80 / Station: -1    Extremities: non-tender, symmetric, no edema bilaterally.  DTRs:  +2 Neurologic: Alert & oriented x 3.   Baseline FHR: 155 with accelerations to 170s to 180, moderate variability Toco: irregular contractions    Assessment:  Molly Bray is a 22 y.o. [phone] female at 667w0d with upper abdominal pain/ nausea/ vomiting/diarrhea. R/O acute gastroenteritis. R/O cholecystitis Some cervical change: R/O early labor URI vs allergies FWB: Cat 1 tracing    Plan: Admit for observation and IV hydration CBC, CMP, UA, RPR, T&S Zofran IV Clear liquids/ crackers as tolerated If diarrhea persists consider stool specimens for C difficle.   07/29/2017 8:16 PM

## 2017-07-29 NOTE — OB Triage Note (Signed)
Patient given discharge instructions and verbalized understanding.

## 2017-07-29 NOTE — Final Progress Note (Addendum)
Physician Final Progress Note  Patient ID: Molly Bray MRN: [id number] DOB/AGE: 1996-03-07 22 y.o.  Admit date: 07/29/2017 Admitting provider: Vena Austria, MD Discharge date: 07/29/2017   Admission Diagnoses: IUP at 36 weeks with nausea, vomiting and diarrhea Abdominal pain Preterm contractions Discharge Diagnoses:  Same as above Nausea and vomiting resolved Consults: None  Significant Findings/ Diagnostic Studies: Molly Bray is a 22 y.o. [phone] female with EDC=08/26/2017 at [redacted]w[redacted]d dated by a 7 week ultrasound.  She presented with complaints of nausea and vomiting, diarrhea, and abdominal pain. She "passed her mucous plug" this AM around 0300. After that she started to have upper abdominal pain and then some lower abdominal pain and pelvic pressure. Has vomited 4-5 times today and has had 4-5 loose stools. No blood in vomitus or stool. Has not vomited in 4-5 hours, but has not tried to drink or eat. Last ate solid food last night before bed: had country fried steak (frozen dinner). She has had nasal congestion, rhinorrhea (clear drainage), and cough since before Christmas and took Azithromycin, but it did not relieve symptoms. Denies fever, dysuria. Also complains of lower back pain, tingling in thighs, and hip pain. Baby moving "less than usual".   Patient was given IV hydration and 4 mgm Zofran after which she was able to tolerate juice and crackers. She had no further nausea, vomiting or diarrhea after the IV Zofran was given. Her headache also resolved with hydration/ eating and the contractions spaced out. Cervix remained unchanged after 3 hours. Fetal heart rate tracing was reactive. CBC and electrolytes were WNL. She requested to be discharged before the full liter of IV fluid was infused. A RX for Zofran was sent to her pharmacy and she was advised to try to eat and drink small amounts every 1-2 hours while awake. Her follow up appointment at [facility] is on  07/31/2017. Labor precautions were given   Procedures: none Labs:  Results for orders placed or performed during the hospital encounter of 07/29/17 (from the past 24 hour(s))  CBC     Status: None   Collection Time: 07/29/17  8:24 PM  Result Value Ref Range   WBC 8.5 3.6 - 11.0 K/uL   RBC 3.98 3.80 - 5.20 MIL/uL   Hemoglobin 12.6 12.0 - 16.0 g/dL   HCT 41.3 24.4 - 01.0 %   MCV 92.3 80.0 - 100.0 fL   MCH 31.5 26.0 - 34.0 pg   MCHC 34.2 32.0 - 36.0 g/dL   RDW 27.2 53.6 - 64.4 %   Platelets 196 150 - 440 K/uL  Comprehensive metabolic panel     Status: Abnormal   Collection Time: 07/29/17  8:24 PM  Result Value Ref Range   Sodium 136 135 - 145 mmol/L   Potassium 3.6 3.5 - 5.1 mmol/L   Chloride 105 101 - 111 mmol/L   CO2 22 22 - 32 mmol/L   Glucose, Bld 76 65 - 99 mg/dL   BUN 8 6 - 20 mg/dL   Creatinine, Ser 0.34 0.44 - 1.00 mg/dL   Calcium 8.4 (L) 8.9 - 10.3 mg/dL   Total Protein 6.2 (L) 6.5 - 8.1 g/dL   Albumin 2.8 (L) 3.5 - 5.0 g/dL   AST 20 15 - 41 U/L   ALT 10 (L) 14 - 54 U/L   Alkaline Phosphatase 250 (H) 38 - 126 U/L   Total Bilirubin 0.5 0.3 - 1.2 mg/dL   GFR calc non Af Amer >60 >60 mL/min   GFR  calc Af Amer >60 >60 mL/min   Anion gap 9 5 - 15  Type and screen  [facility]     Status: None (Preliminary result)   Collection Time: 07/29/17  8:24 PM  Result Value Ref Range   ABO/RH(D) PENDING    Antibody Screen PENDING    Sample Expiration      08/01/2017 Performed at [facility], [address] [place], [place] [address]   Urinalysis, Complete w Microscopic     Status: Abnormal   Collection Time: 07/29/17  9:42 PM  Result Value Ref Range   Color, Urine AMBER (A) YELLOW   APPearance CLOUDY (A) CLEAR   Specific Gravity, Urine 1.029 1.005 - 1.030   pH 6.0 5.0 - 8.0   Glucose, UA NEGATIVE NEGATIVE mg/dL   Hgb urine dipstick NEGATIVE NEGATIVE   Bilirubin Urine MODERATE (A) NEGATIVE   Ketones, ur 20 (A) NEGATIVE mg/dL   Protein, ur  604100 (A) NEGATIVE mg/dL   Nitrite NEGATIVE NEGATIVE   Leukocytes, UA TRACE (A) NEGATIVE   RBC / HPF 0-5 0 - 5 RBC/hpf   WBC, UA 0-5 0 - 5 WBC/hpf   Bacteria, UA RARE (A) NONE SEEN   Squamous Epithelial / LPF 6-30 (A) NONE SEEN   Mucus PRESENT   Type and screen     Status: None   Collection Time: 07/29/17 10:04 PM  Result Value Ref Range   ABO/RH(D) O POS    Antibody Screen NEG    Sample Expiration      08/01/2017 Performed at [facility], [address] [place], [place] [address]    Discharge Condition: stable  Disposition: 01-Home or Self Care  Diet: Bland  Discharge Activity: Activity as tolerated  Discharge Instructions    Discharge patient   Complete by:  As directed    Discharge disposition:  01-Home or Self Care   Discharge patient date:  07/29/2017   Discontinue IV   Complete by:  As directed       Total time spent taking care of this patient: 20 minutes  Signed: Farrel Connersolleen Ambre Kobayashi 07/29/2017, 11:23 PM

## 2017-07-29 NOTE — OB Triage Note (Signed)
Patient arrived in triage with c/o abdominal pain, back pain, headache, respiratory illness, n/v, diarrhea, and leg numbness.  Pt reports losing her mucous plug this morning, and n/v ever since.  Pt uncertain if fetus is moving like normal.  Denies leaking of fluid or vaginal bleeding. EFM applied and assessing. Discussed plan of care. Pt verbalized understanding.

## 2017-07-29 NOTE — Progress Notes (Signed)
Patient states "I feel better".  Headache has resolved. Patient able to eat and drink without n/v/d.  Pt reports her legs still feel numb and she still has some pain in her abdomen, but it is "more of a pelvic pain than contraction pain."

## 2017-07-31 ENCOUNTER — Encounter: Payer: Self-pay | Admitting: Obstetrics and Gynecology

## 2017-07-31 ENCOUNTER — Ambulatory Visit (INDEPENDENT_AMBULATORY_CARE_PROVIDER_SITE_OTHER): Payer: Medicaid Other | Admitting: Obstetrics and Gynecology

## 2017-07-31 VITALS — BP 118/78 | Wt 170.0 lb

## 2017-07-31 DIAGNOSIS — O09623 Supervision of young multigravida, third trimester: Secondary | ICD-10-CM

## 2017-07-31 DIAGNOSIS — O09219 Supervision of pregnancy with history of pre-term labor, unspecified trimester: Secondary | ICD-10-CM

## 2017-07-31 DIAGNOSIS — O479 False labor, unspecified: Secondary | ICD-10-CM

## 2017-07-31 DIAGNOSIS — O09899 Supervision of other high risk pregnancies, unspecified trimester: Secondary | ICD-10-CM

## 2017-07-31 DIAGNOSIS — Z3A36 36 weeks gestation of pregnancy: Secondary | ICD-10-CM

## 2017-07-31 DIAGNOSIS — O4700 False labor before 37 completed weeks of gestation, unspecified trimester: Secondary | ICD-10-CM

## 2017-07-31 LAB — RPR: RPR Ser Ql: NONREACTIVE

## 2017-07-31 NOTE — Progress Notes (Signed)
  Routine Prenatal Care Visit  Subjective  Molly Bray is a 22 y.o. [phone] at 646w2d being seen today for ongoing prenatal care.  She is currently monitored for the following issues for this high-risk pregnancy and has High-risk pregnancy, young multigravida; History of preterm delivery, currently pregnant; Suspected fetal anomaly, antepartum; Decreased fetal movement; Preterm labor; Nausea & vomiting; Preterm contractions; and Nausea and vomiting on their problem list.  ----------------------------------------------------------------------------------- Patient reports multiple discomforts. largely unchanged from her ED visit 2 days ago.  She is walking and keeping food down without issues.   Contractions: Not present. Vag. Bleeding: None.  Movement: Present. Denies leaking of fluid.  ----------------------------------------------------------------------------------- The following portions of the patient's history were reviewed and updated as appropriate: allergies, current medications, past family history, past medical history, past social history, past surgical history and problem list. Problem list updated.   Objective  Blood pressure 118/78, weight 170 lb (77.1 kg), last menstrual period 10/27/2016. Pregravid weight 157 lb (71.2 kg) Total Weight Gain 13 lb (5.897 kg) Urinalysis:      Fetal Status: Fetal Heart Rate (bpm): 155 Fundal Height: 36 cm Movement: Present     General:  Alert, oriented and cooperative. Patient is in no acute distress.  Skin: Skin is warm and dry. No rash noted.   Cardiovascular: Normal heart rate noted  Respiratory: Normal respiratory effort, no problems with respiration noted  Abdomen: Soft, gravid, appropriate for gestational age. Pain/Pressure: Present     Pelvic:  Cervical exam deferred        Extremities: Normal range of motion.     Mental Status: Normal mood and affect. Normal behavior. Normal judgment and thought content.   Assessment   21  y.o. [id number] at 371w2d by  08/26/2017, Alternate EDD Entry presenting for routine prenatal visit  Plan   pregnancy #3 Problems (from 10/27/16 to present)    Problem Noted Resolved   High-risk pregnancy, young multigravida 01/09/2017 by Vena AustriaStaebler, Andreas, MD No   Overview Addendum 06/17/2017  1:32 PM by Farrel ConnersGutierrez, Colleen, CNM    Clinic [facility] Prenatal Labs  Dating 7 week US Blood type: O pos  Genetic Screen (Desires) 1 Screen: negative   AFP:      Antibody:negative  Anatomic US Prominent oropharynx-follow up scan by DP WNL Rubella: Immune Varicella: Immune  GTT Third trimester: 67 RPR: NR  Rhogam  HBsAg: negative  TDaP vaccine                       Flu Shot: HIV: negative  Baby Food      Breast                          GBS:   Contraception Mirena Pap: NIL 01/09/17  CBB   Cervical Lengths starting at 16 weeks  CS/VBAC    Support Person                   Preterm labor symptoms and general obstetric precautions including but not limited to vaginal bleeding, contractions, leaking of fluid and fetal movement were reviewed in detail with the patient. Please refer to After Visit Summary for other counseling recommendations.   Return in about 1 week (around 08/07/2017) for Routine Prenatal Appointment.  Thomasene MohairStephen Shaena Parkerson, MD  07/31/2017 10:35 AM

## 2017-08-03 ENCOUNTER — Observation Stay
Admission: EM | Admit: 2017-08-03 | Discharge: 2017-08-03 | Disposition: A | Discharge status: Home / Self Care | Payer: Medicaid Other | Attending: Obstetrics and Gynecology | Admitting: Obstetrics and Gynecology

## 2017-08-03 DIAGNOSIS — Z3490 Encounter for supervision of normal pregnancy, unspecified, unspecified trimester: Secondary | ICD-10-CM

## 2017-08-03 DIAGNOSIS — Z3A00 Weeks of gestation of pregnancy not specified: Secondary | ICD-10-CM | POA: Diagnosis not present

## 2017-08-03 DIAGNOSIS — O26899 Other specified pregnancy related conditions, unspecified trimester: Principal | ICD-10-CM | POA: Insufficient documentation

## 2017-08-03 DIAGNOSIS — M5489 Other dorsalgia: Secondary | ICD-10-CM | POA: Diagnosis not present

## 2017-08-03 DIAGNOSIS — O09623 Supervision of young multigravida, third trimester: Secondary | ICD-10-CM

## 2017-08-03 NOTE — OB Triage Note (Signed)
Patient c/o of back pain in the mid to lower back radiating to upper abdomen area. Back pain is constant rating pain 7 out of 10. Has not taken anything for pain. Pain started around 2330 tonight after getting up to go to the bathroom. Patient also c/o of urge to have a bowel movement "but nothing comes out". Denies vaginal bleeding and denies LOF. Reports positive FM. Denies vaginal intercourse in the last 24 hours. Monitors placed and will continue to monitor.

## 2017-08-03 NOTE — Discharge Instructions (Signed)
Before Baby Comes Home  Before your baby arrives it is important to:   Have all of the supplies that you will need to care for your baby.   Know where to go if there is an emergency.   Discuss the baby's arrival with other family members.    What supplies will I need?    It is recommended that you have the following supplies:  Large Items   Crib.   Crib mattress.   Rear-facing infant car seat. If possible, have a trained professional check to make sure that it is installed correctly.    Feeding   6-8 bottles that are 4-5 oz in size.   6-8 nipples.   Bottle brush.   Sterilizer, or a large pan or kettle with a lid.   A way to boil and cool water.   If you will be breastfeeding:  ? Breast pump.  ? Nipple cream.  ? Nursing bra.  ? Breast pads.  ? Breast shields.   If you will be formula feeding:  ? Formula.  ? Measuring cups.  ? Measuring spoons.    Bathing   Mild baby soap and baby shampoo.   Petroleum jelly.   Soft cloth towel and washcloth.   Hooded towel.   Cotton balls.   Bath basin.    Other Supplies   Rectal thermometer.   Bulb syringe.   Baby wipes or washcloths for diaper changes.   Diaper bag.   Changing pad.   Clothing, including one-piece outfits and pajamas.   Baby nail clippers.   Receiving blankets.   Mattress pad and sheets for the crib.   Night-light for the baby's room.   Baby monitor.   2 or 3 pacifiers.   Either 24-36 cloth diapers and waterproof diaper covers or a box of disposable diapers. You may need to use as many as 10-12 diapers per day.    How do I prepare for an emergency?  Prepare for an emergency by:   Knowing how to get to the nearest hospital.   Listing the phone numbers of your baby's health care providers near your home phone and in your cell phone.    How do I prepare my family?   Decide how to handle visitors.   If you have other children:  ? Talk with them about the baby coming home. Ask them how they feel about it.  ? Read a book together about  being a new big brother or sister.  ? Find ways to let them help you prepare for the new baby.  ? Have someone ready to care for them while you are in the hospital.  This information is not intended to replace advice given to you by your health care provider. Make sure you discuss any questions you have with your health care provider.  Document Released: 06/14/2008 Document Revised: 12/08/2015 Document Reviewed: 06/09/2014  Elsevier Interactive Patient Education  2018 [employer]

## 2017-08-03 NOTE — Progress Notes (Signed)
Notified provider, Dr. Jerene PitchSchuman, at [phone] of patient declining to send urine, patient stating "I don't think I have an UTI, they keep running these test and nothing is wrong". Reported to provider about Category I NST and patient's desire to go home. Verbal order from provider to discharge patient home. Patient then educated about back labor, baby position probably causing back labor, and pelvic pressure due to how low baby is. Patient aggreed to go home on supportive therapy for back labor such as back warm compression, warm shower, Tylenol as needed, as advised by Dr. Gaynelle ArabianShuman. Patient discharge to home at 0230 in stable condition.

## 2017-08-03 NOTE — Progress Notes (Signed)
Spoke with provider, Dr. Jerene PitchSchuman, in regards to patient triage report. Reported on patient's complaint, category I tracing, vitals WNL and cervical exam. Verbal order from provider to send UA and if all clear to send patient home on supportive therapy for back pain. Will discuss with patient plan of care and update provider of any changes.

## 2017-08-06 ENCOUNTER — Ambulatory Visit (INDEPENDENT_AMBULATORY_CARE_PROVIDER_SITE_OTHER): Payer: Medicaid Other | Admitting: Obstetrics and Gynecology

## 2017-08-06 ENCOUNTER — Encounter: Payer: Self-pay | Admitting: Obstetrics and Gynecology

## 2017-08-06 VITALS — BP 120/74 | Wt 170.0 lb

## 2017-08-06 DIAGNOSIS — O09623 Supervision of young multigravida, third trimester: Secondary | ICD-10-CM

## 2017-08-06 DIAGNOSIS — O479 False labor, unspecified: Secondary | ICD-10-CM

## 2017-08-06 DIAGNOSIS — O09899 Supervision of other high risk pregnancies, unspecified trimester: Secondary | ICD-10-CM

## 2017-08-06 DIAGNOSIS — O4700 False labor before 37 completed weeks of gestation, unspecified trimester: Secondary | ICD-10-CM

## 2017-08-06 DIAGNOSIS — Z3A37 37 weeks gestation of pregnancy: Secondary | ICD-10-CM

## 2017-08-06 DIAGNOSIS — O09219 Supervision of pregnancy with history of pre-term labor, unspecified trimester: Secondary | ICD-10-CM

## 2017-08-06 NOTE — Progress Notes (Signed)
  Routine Prenatal Care Visit  Subjective  Molly Bray is a 22 y.o. [phone] at 3758w1d being seen today for ongoing prenatal care.  She is currently monitored for the following issues for this high-risk pregnancy and has High-risk pregnancy, young multigravida; History of preterm delivery, currently pregnant; Suspected fetal anomaly, antepartum; Decreased fetal movement; Preterm labor; Nausea & vomiting; Preterm contractions; Nausea and vomiting; and Pregnancy on their problem list.  ----------------------------------------------------------------------------------- Patient reports backache.   Contractions: Not present. Vag. Bleeding: None.  Movement: Present. Denies leaking of fluid.  ----------------------------------------------------------------------------------- The following portions of the patient's history were reviewed and updated as appropriate: allergies, current medications, past family history, past medical history, past social history, past surgical history and problem list. Problem list updated.  Objective  Blood pressure 120/74, weight 170 lb (77.1 kg), last menstrual period 10/27/2016. Pregravid weight 157 lb (71.2 kg) Total Weight Gain 13 lb (5.897 kg) Urinalysis: Urine Protein: Negative Urine Glucose: Negative  Fetal Status: Fetal Heart Rate (bpm): 155 Fundal Height: 37 cm Movement: Present  Presentation: Vertex  General:  Alert, oriented and cooperative. Patient is in no acute distress.  Skin: Skin is warm and dry. No rash noted.   Cardiovascular: Normal heart rate noted  Respiratory: Normal respiratory effort, no problems with respiration noted  Abdomen: Soft, gravid, appropriate for gestational age. Pain/Pressure: Present     Pelvic:  Cervical exam performed Dilation: 3.5 Effacement (%): 70 Station: -2  Extremities: Normal range of motion.     Mental Status: Normal mood and affect. Normal behavior. Normal judgment and thought content.   Assessment   21 y.o.  [id number] at 858w1d by  08/26/2017, Alternate EDD Entry presenting for routine prenatal visit  Plan   pregnancy #3 Problems (from 10/27/16 to present)    Problem Noted Resolved   High-risk pregnancy, young multigravida 01/09/2017 by Vena AustriaStaebler, Andreas, MD No   Overview Addendum 06/17/2017  1:32 PM by Farrel ConnersGutierrez, Colleen, CNM    [facility]inic [facility] Prenatal Labs  Dating 7 week US Blood type: O pos  Genetic Screen (Desires) 1 Screen: negative   AFP:      Antibody:negative  Anatomic US Prominent oropharynx-follow up scan by DP WNL Rubella: Immune Varicella: Immune  GTT Third trimester: 67 RPR: NR  Rhogam  HBsAg: negative  TDaP vaccine                       Flu Shot: HIV: negative  Baby Food      Breast                          GBS:   Contraception Mirena Pap: NIL 01/09/17  CBB   Cervical Lengths starting at 16 weeks  CS/VBAC    Support Person               Term labor symptoms and general obstetric precautions including but not limited to vaginal bleeding, contractions, leaking of fluid and fetal movement were reviewed in detail with the patient. Please refer to After Visit Summary for other counseling recommendations.   Return in about 1 week (around 08/13/2017) for Routine Prenatal Appointment.  Molly MohairStephen Ercel Normoyle, MD  08/06/2017 9:25 AM

## 2017-08-12 ENCOUNTER — Inpatient Hospital Stay: Payer: Medicaid Other | Admitting: Registered Nurse

## 2017-08-12 ENCOUNTER — Inpatient Hospital Stay
Admission: EM | Admit: 2017-08-12 | Discharge: 2017-08-14 | DRG: 806 | Disposition: A | Discharge status: Home / Self Care | Payer: Medicaid Other | Attending: Obstetrics and Gynecology | Admitting: Obstetrics and Gynecology

## 2017-08-12 ENCOUNTER — Other Ambulatory Visit: Payer: Self-pay

## 2017-08-12 DIAGNOSIS — D62 Acute posthemorrhagic anemia: Secondary | ICD-10-CM | POA: Diagnosis not present

## 2017-08-12 DIAGNOSIS — Z3483 Encounter for supervision of other normal pregnancy, third trimester: Secondary | ICD-10-CM | POA: Diagnosis present

## 2017-08-12 DIAGNOSIS — O358XX0 Maternal care for other (suspected) fetal abnormality and damage, not applicable or unspecified: Secondary | ICD-10-CM | POA: Diagnosis present

## 2017-08-12 DIAGNOSIS — Z3A38 38 weeks gestation of pregnancy: Secondary | ICD-10-CM | POA: Diagnosis not present

## 2017-08-12 DIAGNOSIS — O9081 Anemia of the puerperium: Secondary | ICD-10-CM | POA: Diagnosis not present

## 2017-08-12 DIAGNOSIS — O09623 Supervision of young multigravida, third trimester: Secondary | ICD-10-CM

## 2017-08-12 DIAGNOSIS — F17210 Nicotine dependence, cigarettes, uncomplicated: Secondary | ICD-10-CM | POA: Diagnosis present

## 2017-08-12 DIAGNOSIS — Z880 Allergy status to penicillin: Secondary | ICD-10-CM | POA: Diagnosis not present

## 2017-08-12 DIAGNOSIS — O99334 Smoking (tobacco) complicating childbirth: Principal | ICD-10-CM | POA: Diagnosis present

## 2017-08-12 LAB — TYPE AND SCREEN
ABO/RH(D): O POS
ANTIBODY SCREEN: NEGATIVE

## 2017-08-12 LAB — CBC
HCT: 39.6 % (ref 35.0–47.0)
Hemoglobin: 13.6 g/dL (ref 12.0–16.0)
MCH: 31.2 pg (ref 26.0–34.0)
MCHC: 34.3 g/dL (ref 32.0–36.0)
MCV: 90.9 fL (ref 80.0–100.0)
PLATELETS: 225 10*3/uL (ref 150–440)
RBC: 4.35 MIL/uL (ref 3.80–5.20)
RDW: 13.4 % (ref 11.5–14.5)
WBC: 12.5 10*3/uL — ABNORMAL HIGH (ref 3.6–11.0)

## 2017-08-12 MED ORDER — LIDOCAINE HCL (PF) 1 % IJ SOLN
INTRAMUSCULAR | Status: AC
  Filled 2017-08-12: qty 30

## 2017-08-12 MED ORDER — SENNOSIDES-DOCUSATE SODIUM 8.6-50 MG PO TABS
2.0000 | ORAL_TABLET | ORAL | Status: DC
  Administered 2017-08-14: 2 via ORAL
  Filled 2017-08-12 (×2): qty 2

## 2017-08-12 MED ORDER — BUPIVACAINE HCL (PF) 0.25 % IJ SOLN
INTRAMUSCULAR | Status: DC | PRN
  Administered 2017-08-12 (×2): 5 mL via EPIDURAL

## 2017-08-12 MED ORDER — LIDOCAINE-EPINEPHRINE (PF) 1.5 %-1:200000 IJ SOLN
INTRAMUSCULAR | Status: DC | PRN
  Administered 2017-08-12: 3 mL via EPIDURAL

## 2017-08-12 MED ORDER — ONDANSETRON HCL 4 MG/2ML IJ SOLN: 4.0000 mg | Freq: Four times a day (QID) | INTRAMUSCULAR | Status: DC | PRN

## 2017-08-12 MED ORDER — WITCH HAZEL-GLYCERIN EX PADS
1.0000 "application " | MEDICATED_PAD | CUTANEOUS | Status: DC | PRN
  Filled 2017-08-12: qty 100

## 2017-08-12 MED ORDER — BUTORPHANOL TARTRATE 1 MG/ML IJ SOLN: 1.0000 mg | INTRAMUSCULAR | Status: DC | PRN

## 2017-08-12 MED ORDER — ONDANSETRON HCL 4 MG/2ML IJ SOLN: 4.0000 mg | INTRAMUSCULAR | Status: DC | PRN

## 2017-08-12 MED ORDER — PRENATAL MULTIVITAMIN CH
1.0000 | ORAL_TABLET | Freq: Every day | ORAL | Status: DC
  Administered 2017-08-13: 1 via ORAL
  Filled 2017-08-12: qty 1

## 2017-08-12 MED ORDER — LIDOCAINE HCL (PF) 1 % IJ SOLN
INTRAMUSCULAR | Status: DC | PRN
  Administered 2017-08-12: 3 mL via SUBCUTANEOUS

## 2017-08-12 MED ORDER — OXYTOCIN 10 UNIT/ML IJ SOLN
INTRAMUSCULAR | Status: AC
  Filled 2017-08-12: qty 2

## 2017-08-12 MED ORDER — OXYTOCIN 40 UNITS IN LACTATED RINGERS INFUSION - SIMPLE MED
INTRAVENOUS | Status: AC
  Filled 2017-08-12: qty 1000

## 2017-08-12 MED ORDER — ONDANSETRON HCL 4 MG PO TABS: 4.0000 mg | ORAL_TABLET | ORAL | Status: DC | PRN

## 2017-08-12 MED ORDER — FENTANYL 2.5 MCG/ML W/ROPIVACAINE 0.15% IN NS 100 ML EPIDURAL (ARMC)
EPIDURAL | Status: AC
  Filled 2017-08-12: qty 100

## 2017-08-12 MED ORDER — OXYTOCIN BOLUS FROM INFUSION
500.0000 mL | Freq: Once | INTRAVENOUS | Status: AC
  Administered 2017-08-12: 500 mL via INTRAVENOUS

## 2017-08-12 MED ORDER — OXYCODONE-ACETAMINOPHEN 5-325 MG PO TABS: 2.0000 | ORAL_TABLET | ORAL | Status: DC | PRN

## 2017-08-12 MED ORDER — AMMONIA AROMATIC IN INHA
RESPIRATORY_TRACT | Status: AC
  Filled 2017-08-12: qty 10

## 2017-08-12 MED ORDER — OXYCODONE-ACETAMINOPHEN 5-325 MG PO TABS: 1.0000 | ORAL_TABLET | ORAL | Status: DC | PRN

## 2017-08-12 MED ORDER — COCONUT OIL OIL: 1.0000 "application " | TOPICAL_OIL | Status: DC | PRN

## 2017-08-12 MED ORDER — LIDOCAINE HCL (PF) 1 % IJ SOLN: 30.0000 mL | INTRAMUSCULAR | Status: DC | PRN

## 2017-08-12 MED ORDER — BUTORPHANOL TARTRATE 1 MG/ML IJ SOLN
INTRAMUSCULAR | Status: AC
  Administered 2017-08-12: 1 mg
  Filled 2017-08-12: qty 1

## 2017-08-12 MED ORDER — SIMETHICONE 80 MG PO CHEW
80.0000 mg | CHEWABLE_TABLET | ORAL | Status: DC | PRN
Start: 2017-08-12 — End: 2017-08-14

## 2017-08-12 MED ORDER — DIBUCAINE 1 % RE OINT
1.0000 "application " | TOPICAL_OINTMENT | RECTAL | Status: DC | PRN
  Administered 2017-08-13: 1 via RECTAL
  Filled 2017-08-12: qty 28

## 2017-08-12 MED ORDER — IBUPROFEN 600 MG PO TABS
600.0000 mg | ORAL_TABLET | Freq: Four times a day (QID) | ORAL | Status: DC
  Administered 2017-08-12 – 2017-08-13 (×2): 600 mg via ORAL
  Filled 2017-08-12 (×2): qty 1

## 2017-08-12 MED ORDER — DIPHENHYDRAMINE HCL 25 MG PO CAPS: 25.0000 mg | ORAL_CAPSULE | Freq: Four times a day (QID) | ORAL | Status: DC | PRN

## 2017-08-12 MED ORDER — LACTATED RINGERS IV SOLN: 500.0000 mL | INTRAVENOUS | Status: DC | PRN

## 2017-08-12 MED ORDER — SOD CITRATE-CITRIC ACID 500-334 MG/5ML PO SOLN: 30.0000 mL | ORAL | Status: DC | PRN

## 2017-08-12 MED ORDER — OXYTOCIN 40 UNITS IN LACTATED RINGERS INFUSION - SIMPLE MED
2.5000 [IU]/h | INTRAVENOUS | Status: DC
  Administered 2017-08-12: 2.5 [IU]/h via INTRAVENOUS

## 2017-08-12 MED ORDER — ACETAMINOPHEN 325 MG PO TABS: 650.0000 mg | ORAL_TABLET | ORAL | Status: DC | PRN

## 2017-08-12 MED ORDER — FENTANYL 2.5 MCG/ML W/ROPIVACAINE 0.15% IN NS 100 ML EPIDURAL (ARMC)
EPIDURAL | Status: DC | PRN
  Administered 2017-08-12: 12 mL/h via EPIDURAL

## 2017-08-12 MED ORDER — LACTATED RINGERS IV SOLN
INTRAVENOUS | Status: DC
  Administered 2017-08-12 (×2): via INTRAVENOUS

## 2017-08-12 MED ORDER — BENZOCAINE-MENTHOL 20-0.5 % EX AERO: 1.0000 "application " | INHALATION_SPRAY | CUTANEOUS | Status: DC | PRN

## 2017-08-12 MED ORDER — MISOPROSTOL 200 MCG PO TABS
ORAL_TABLET | ORAL | Status: AC
  Filled 2017-08-12: qty 4

## 2017-08-12 NOTE — Discharge Summary (Signed)
OB Discharge Summary     Patient Name: Molly Bray DOB: 08/28/1995 MRN: [id number]  Date of admission: 08/12/2017 Delivering provider: Tresea MallJane Gledhill, CNM  Date of Delivery: 08/12/2017  Date of discharge: 08/14/2017   Admitting diagnosis: 38 wks preg Intrauterine pregnancy: 5315w0d     Secondary diagnosis: None     Discharge diagnosis: Term Pregnancy Delivered                                                                                                Post partum procedures: none  Augmentation: none  Complications: None  Hospital course:  Onset of Labor With Vaginal Delivery      22 y.o. yo [id number] at 8515w0d was admitted in Active Labor on 08/12/2017.  Patient had an uncomplicated labor course as follows:  Membrane Rupture Time/Date: 10:40 AM ,08/12/2017   Patient had delivery of viable female at 3:22 PM, 08/12/2017 Details of delivery can be found in a separate delivery note  Patient had an uncomplicated postpartum course.   She is ambulating, tolerating a regular diet, passing flatus, and urinating well.  Patient is discharged home in stable condition on 08/14/2017 .   Physical exam  Vitals:   08/13/17 1120 08/13/17 1624 08/13/17 2349 08/14/17 0830  BP: 96/70 107/69 116/61 109/68  Pulse: 89 92 91 67  Resp: 18 18 18 18   Temp: 98.1 F (36.7 C) 97.8 F (36.6 C) 97.9 F (36.6 C) 98.3 F (36.8 C)  TempSrc: Oral Oral Oral Oral  SpO2: 99% 98% 99% 99%  Weight:      Height:       General: alert, cooperative and no distress Lochia: appropriate Uterine Fundus: firm Incision: N/A DVT Evaluation: No evidence of DVT seen on physical exam.  Labs: Lab Results  Component Value Date   WBC 14.0 (H) 08/13/2017   HGB 11.7 (L) 08/13/2017   HCT 35.2 08/13/2017   MCV 91.9 08/13/2017   PLT 211 08/13/2017    Discharge instruction: per After Visit Summary.  Medications:   Allergies as of 08/14/2017      Reactions   Amoxicillin Anaphylaxis   Penicillins Swelling         Medication List    STOP taking these medications   Butalbital-APAP-Caffeine 50-325-40 MG capsule   guaiFENesin 100 MG/5ML Soln Commonly known as:  ROBITUSSIN   ondansetron 8 MG tablet Commonly known as:  ZOFRAN     TAKE these medications   ibuprofen 600 MG tablet Commonly known as:  ADVIL,MOTRIN Take 1 tablet (600 mg total) by mouth every 6 (six) hours.            Discharge Care Instructions  (From admission, onward)        Start     Ordered   08/14/17 0000  Discharge wound care:    Comments:  Perform wound care instructions   08/14/17 0957   08/14/17 0000  Discharge wound care:    Comments:  Perform wound care instructions   08/14/17 1000       Diet: routine diet  Activity: Advance as tolerated. Pelvic rest for 6 weeks.  Outpatient follow up: Follow-up Information    [facility]. Go in 1 week(s).   Specialty:  Obstetrics and Gynecology Why:  mood check and clearance for return to part time work appointment Contact information: [address] [place] [address] [phone]            Postpartum contraception: Undecided Rhogam Given postpartum: NA Rubella vaccine given postpartum: Rubella Immune Varicella vaccine given postpartum: Varicella Immune TDaP given antepartum or postpartum: given 06/28/2017  Newborn Data: Live born female Malachi Birth Weight: 7 lb 8.6 oz (3420 g) APGAR: 8, 9  Newborn Delivery   Birth date/time:  08/12/2017 15:22:00 Delivery type:  Vaginal, Spontaneous      Baby Feeding: formula  Disposition:home with mother  SIGNED:  [name], MD 08/14/2017 10:02 AM

## 2017-08-12 NOTE — H&P (Signed)
OB History & Physical   History of Present Illness:  Chief Complaint: water broke and contractions  HPI:  Molly Bray is a 22 y.o. [phone] female at [redacted]w[redacted]d dated by 7 week ultrasound .  Her pregnancy has been complicated by history of preterm delivery, nausea and vomiting, suspected fetal anomaly with subsequent imaging within normal limits.    She reports contractions that began shortly after her water broke.   She reports leakage of fluid at 10:40 this morning.   She reports vaginal bleeding/bloody show.   She reports fetal movement.    Maternal Medical History:   Past Medical History:  Diagnosis Date  . Anxiety   . Depression   . Pyelonephritis   . Recurrent UTI     Past Surgical History:  Procedure Laterality Date  . KIDNEY SURGERY    . KIDNEY SURGERY      Allergies  Allergen Reactions  . Amoxicillin Anaphylaxis  . Penicillins Swelling    Has patient had a PCN reaction causing immediate rash, facial/tongue/throat swelling, SOB or lightheadedness with hypotension: Yes Has patient had a PCN reaction causing severe rash involving mucus membranes or skin necrosis: No Has patient had a PCN reaction that required hospitalization No Has patient had a PCN reaction occurring within the last 10 years: No If all of the above answers are "NO", then may proceed with Cephalosporin use.    Prior to Admission medications   Medication Sig Start Date End Date Taking? Authorizing Provider  Butalbital-APAP-Caffeine 50-325-40 MG capsule Take 1-2 capsules by mouth every 6 (six) hours as needed for headache. Patient not taking: Reported on 08/12/2017 06/28/17   Tresea Mall, CNM  guaiFENesin (ROBITUSSIN) 100 MG/5ML SOLN Take 5 mLs by mouth every 4 (four) hours as needed for cough or to loosen phlegm.    [provider]  ondansetron (ZOFRAN) 8 MG tablet Take 1 tablet (8 mg total) by mouth every 8 (eight) hours as needed for nausea or vomiting. Patient not taking: Reported on  08/12/2017 07/29/17   Farrel Conners, CNM    OB History  Gravida Para Term Preterm AB Living  3 1   1 1 1   SAB TAB Ectopic Multiple Live Births  1       1    # Outcome Date GA Lbr Len/2nd Weight Sex Delivery Anes PTL Lv  3 Current           2 Preterm 01/06/13 [redacted]w[redacted]d  5 lb 4 oz (2.381 kg) M Vag-Spont  Y LIV  1 SAB               Prenatal care site: [facility]  Social History: She  reports that she has been smoking cigarettes.  She has been smoking about 0.50 packs per day. she has never used smokeless tobacco. She reports that she does not drink alcohol or use drugs.  Family History: family history includes Diabetes in her maternal aunt.    Review of Systems: Negative x 10 systems reviewed except as noted in the HPI.    Physical Exam:  Vital Signs: BP 103/65 (BP Location: Right Arm)   Pulse 87   Temp 97.6 F (36.4 C) (Oral)   Resp 18   Ht 5\' 5"  (1.651 m)   Wt 168 lb (76.2 kg)   LMP 10/27/2016   BMI 27.96 kg/m  Constitutional: Well nourished, well developed female in no acute distress.  HEENT: normal Skin: Warm and dry.  Cardiovascular: Regular rate and rhythm.  Extremity: no edema  Respiratory: Clear to auscultation bilateral. Normal respiratory effort Abdomen: FHT present Back: no CVAT Neuro: DTRs 2+, Cranial nerves grossly intact Psych: Alert and Oriented x3. No memory deficits. Normal mood and affect.  MS: normal gait, normal bilateral lower extremity ROM/strength/stability.  Pelvic exam:  is not limited by body habitus EGBUS: within normal limits Vagina: within normal limits and with normal mucosa, blood in the vault Cervix: 8/90/0 to +1   Pertinent Results:  Prenatal Labs: Blood type/Rh O positive  Antibody screen negative  Rubella Immune  Varicella Immune    RPR Non-reactive  HBsAg negative  HIV negative  GC negative  Chlamydia negative  Genetic screening First screen negative  1 hour GTT 67 on 11/30  3 hour GTT NA  GBS negative on  12/23   Baseline FHR: 155 beats/min   Variability: moderate   Accelerations: present   Decelerations: absent Contractions: present frequency: every 1-2 minutes Overall assessment: Category I   Assessment:  Molly Bray is a 22 y.o. [id number] female at 467w0d with SROM/active labor.   Plan:  1. Admit to Labor & Delivery  2. CBC, T&S, Clrs, IVF 3. GBS negative.   4. Fetal well-being: Category I 5. Epidural as desired 6. Expectant management for vaginal delivery  Tresea MallJane Jeannetta Cerutti, CNM 08/12/2017 1:28 PM

## 2017-08-12 NOTE — Lactation Note (Signed)
This note was copied from a baby's chart. Lactation Consultation Note  Patient Name: Molly Bray ZOXWR'UToday's Date: 08/12/2017  Lactation consult requested for this mom.  Her original statement on admission was to breast and bottle feed formula.  Mom only wants to breast feed or pump for 2 to 3 days to get colostrum. First praised mom for healthy choice to consider given her colostrum.  Discussed supply and demand and why stimulating breast could lead to her mature milk transitioning on in and risk of engorgement.  Explained normal course of lactation and routine newborn feeding patterns and why she may not get any or enough colostrum in first couple of days if only planned to pump.  Mom decided to just give a bottle for now and would think about her alternatives.  Encouraged mom to call lactation if she decided to breast feed or pump her colostrum.  Maternal Data    Feeding Feeding Type: Bottle Fed - Formula Nipple Type: Regular Length of feed: 10 min  LATCH Score                   Interventions    Lactation Tools Discussed/Used     Consult Status      Louis MeckelWilliams, Molly Bray 08/12/2017, 9:00 PM

## 2017-08-12 NOTE — Anesthesia Preprocedure Evaluation (Signed)
Anesthesia Evaluation  Patient identified by MRN, date of birth, ID band Patient awake    Reviewed: Allergy & Precautions, H&P , NPO status , Patient's Chart, lab work & pertinent test results  Airway Mallampati: III   Neck ROM: full    Dental  (+) Teeth Intact   Pulmonary Current Smoker,    Pulmonary exam normal        Cardiovascular Normal cardiovascular exam     Neuro/Psych PSYCHIATRIC DISORDERS negative neurological ROS     GI/Hepatic negative GI ROS, Neg liver ROS,   Endo/Other  negative endocrine ROS  Renal/GU negative Renal ROS  negative genitourinary   Musculoskeletal   Abdominal   Peds  Hematology negative hematology ROS (+)   Anesthesia Other Findings   Reproductive/Obstetrics (+) Pregnancy                             Anesthesia Physical Anesthesia Plan  ASA: II  Anesthesia Plan: Epidural   Post-op Pain Management:    Induction:   PONV Risk Score and Plan:   Airway Management Planned:   Additional Equipment:   Intra-op Plan:   Post-operative Plan:   Informed Consent: I have reviewed the patients History and Physical, chart, labs and discussed the procedure including the risks, benefits and alternatives for the proposed anesthesia with the patient or authorized representative who has indicated his/her understanding and acceptance.     Plan Discussed with: CRNA and Anesthesiologist  Anesthesia Plan Comments:         Anesthesia Quick Evaluation

## 2017-08-12 NOTE — Anesthesia Procedure Notes (Signed)
Epidural Patient location during procedure: OB Start time: 08/12/2017 1:25 PM End time: 08/12/2017 1:30 PM  Staffing Anesthesiologist: Alver FisherPenwarden, Amy, MD Resident/CRNA: Karoline CaldwellStarr, Netta Fodge, CRNA Performed: resident/CRNA   Preanesthetic Checklist Completed: patient identified, site marked, surgical consent, pre-op evaluation, timeout performed, IV checked, risks and benefits discussed and monitors and equipment checked  Epidural Patient position: sitting Prep: Betadine Patient monitoring: heart rate, continuous pulse ox and blood pressure Approach: midline Location: L4-L5 Injection technique: LOR saline  Needle:  Needle type: Tuohy  Needle gauge: 17 G Needle length: 9 cm and 9 Needle insertion depth: 5 cm Catheter type: closed end flexible Catheter size: 19 Gauge Catheter at skin depth: 11 cm Test dose: negative and 1.5% lidocaine with Epi 1:200 K  Assessment Events: blood not aspirated, injection not painful, no injection resistance, negative IV test and no paresthesia  Additional Notes Pt. Evaluated and documentation done after procedure finished. Patient identified. Risks/Benefits/Options discussed with patient including but not limited to bleeding, infection, nerve damage, paralysis, failed block, incomplete pain control, headache, blood pressure changes, nausea, vomiting, reactions to medication both or allergic, itching and postpartum back pain. Confirmed with bedside nurse the patient's most recent platelet count. Confirmed with patient that they are not currently taking any anticoagulation, have any bleeding history or any family history of bleeding disorders. Patient expressed understanding and wished to proceed. All questions were answered. Sterile technique was used throughout the entire procedure. Please see nursing notes for vital signs. Test dose was given through epidural catheter and negative prior to continuing to dose epidural or start infusion. Warning signs of high block  given to the patient including shortness of breath, tingling/numbness in hands, complete motor block, or any concerning symptoms with instructions to call for help. Patient was given instructions on fall risk and not to get out of bed. All questions and concerns addressed with instructions to call with any issues or inadequate analgesia.   Patient tolerated the insertion well without immediate complications.Reason for block:procedure for pain

## 2017-08-13 ENCOUNTER — Encounter: Payer: Medicaid Other | Admitting: Obstetrics and Gynecology

## 2017-08-13 LAB — CBC
HEMATOCRIT: 35.2 % (ref 35.0–47.0)
Hemoglobin: 11.7 g/dL — ABNORMAL LOW (ref 12.0–16.0)
MCH: 30.6 pg (ref 26.0–34.0)
MCHC: 33.2 g/dL (ref 32.0–36.0)
MCV: 91.9 fL (ref 80.0–100.0)
Platelets: 211 10*3/uL (ref 150–440)
RBC: 3.83 MIL/uL (ref 3.80–5.20)
RDW: 13.5 % (ref 11.5–14.5)
WBC: 14 10*3/uL — AB (ref 3.6–11.0)

## 2017-08-13 LAB — RPR: RPR: NONREACTIVE

## 2017-08-13 MED ORDER — IBUPROFEN 600 MG PO TABS
600.0000 mg | ORAL_TABLET | Freq: Four times a day (QID) | ORAL | Status: DC
  Administered 2017-08-13 – 2017-08-14 (×5): 600 mg via ORAL
  Filled 2017-08-13 (×5): qty 1

## 2017-08-13 NOTE — Anesthesia Postprocedure Evaluation (Signed)
Anesthesia Post Note  Patient: Molly Bray  Procedure(s) Performed: AN AD HOC LABOR EPIDURAL  Patient location during evaluation: Mother Baby Anesthesia Type: Epidural Level of consciousness: awake, oriented and awake and alert Pain management: pain level controlled Vital Signs Assessment: post-procedure vital signs reviewed and stable Respiratory status: spontaneous breathing, nonlabored ventilation and respiratory function stable Cardiovascular status: stable Anesthetic complications: no     Last Vitals:  Vitals:   08/12/17 2336 08/13/17 0429  BP: (!) 118/57 108/75  Pulse: 72 89  Resp: 18 18  Temp: 36.6 C 37 C  SpO2: 98% 99%    Last Pain:  Vitals:   08/13/17 0429  TempSrc: Oral  PainSc:                  Casey Burkitthuy Macen Joslin

## 2017-08-13 NOTE — Progress Notes (Addendum)
PPD#1 SVD Subjective:  Side-lying in bed, well-appearing. Is anxious about well-being of her older child at home with elderly caregiver.  Pain control is adequate. Voiding without difficulty. Tolerating a regular diet. Ambulating well.  Objective:   Blood pressure 99/66, pulse 75, temperature 98.1 F (36.7 C), temperature source Oral, resp. rate 18, height 5\' 5"  (1.651 m), weight 168 lb (76.2 kg), last menstrual period 10/27/2016, SpO2 98 %.  General: NAD Pulmonary: no increased work of breathing Abdomen: non-distended, non-tender Uterus:  fundus firm at U/1; lochia rubra small Laceration: none Extremities: no edema, no erythema, no tenderness, no signs of DVT  Results for orders placed or performed during the hospital encounter of 08/12/17 (from the past 72 hour(s))  Type and screen [facility]     Status: None   Collection Time: 08/12/17 12:11 PM  Result Value Ref Range   ABO/RH(D) O POS    Antibody Screen NEG    Sample Expiration      08/15/2017 Performed at [facility], [address] [place], [place] [address]   CBC     Status: Abnormal   Collection Time: 08/12/17 12:15 PM  Result Value Ref Range   WBC 12.5 (H) 3.6 - 11.0 K/uL   RBC 4.35 3.80 - 5.20 MIL/uL   Hemoglobin 13.6 12.0 - 16.0 g/dL   HCT 60.439.6 54.035.0 - 98.147.0 %   MCV 90.9 80.0 - 100.0 fL   MCH 31.2 26.0 - 34.0 pg   MCHC 34.3 32.0 - 36.0 g/dL   RDW 19.113.4 47.811.5 - 29.514.5 %   Platelets 225 150 - 440 K/uL    Comment: Performed at [facility], [address] [place], [place] [address]  RPR     Status: None   Collection Time: 08/12/17 12:15 PM  Result Value Ref Range   RPR Ser Ql Non Reactive Non Reactive    Comment: (NOTE) Performed At: [facility] [address] [place], [place] [address] Jolene SchimkeNagendra Sanjai MD [phone] Performed at [facility], [address] [place], [place] [address]   CBC     Status: Abnormal   Collection Time: 08/13/17  4:30 AM   Result Value Ref Range   WBC 14.0 (H) 3.6 - 11.0 K/uL   RBC 3.83 3.80 - 5.20 MIL/uL   Hemoglobin 11.7 (L) 12.0 - 16.0 g/dL   HCT 53.635.2 64.435.0 - 03.447.0 %   MCV 91.9 80.0 - 100.0 fL   MCH 30.6 26.0 - 34.0 pg   MCHC 33.2 32.0 - 36.0 g/dL   RDW 74.213.5 59.511.5 - 63.814.5 %   Platelets 211 150 - 440 K/uL    Comment: Performed at [facility], [address] [place], [place] [address]    Assessment:   22 y.o. [id number] postpartum day #1 in good condition.  Plan:    1) Acute blood loss anemia - hemodynamically stable and asymptomatic  2) Blood Type --/--/O POS (01/28 1211) /   3) Rubella 1.24 (06/27 0932) / Varicella immune / TDAP status given 06/28/2017  4) Formula feeding  5) Contraception: Mirena  6) Disposition - Patient desired discharge at 24 hours due to child care issues at home. Her baby must stay in hospital for 48 hours due to Coombs positive status. Explained that babies who are well newborns must have an adult present during their admission. Nursing staff indicated that she could bring her 23 year-old to the floor to stay with her if necessary. Patient verbalized understanding. Continue postpartum  care and discharge on PPD#2 when baby is ready for discharge.  Marcelyn Bruins, CNM 08/13/2017  10:14 AM

## 2017-08-14 MED ORDER — IBUPROFEN 600 MG PO TABS: 600.0000 mg | ORAL_TABLET | Freq: Four times a day (QID) | ORAL | 0 refills | Status: DC

## 2017-08-14 NOTE — Progress Notes (Signed)
Patient discharged home with infant. Discharge instructions and prescriptions given and reviewed with patient. Patient verbalized understanding. Escorted out by auxillary.  

## 2017-08-14 NOTE — Clinical Social Work Maternal (Signed)
  CLINICAL SOCIAL WORK MATERNAL/CHILD NOTE  Patient Details  Name: Molly Bray MRN: [id number] Date of Birth: October 20, 1995  Date:  08/14/2017  Clinical Social Worker Initiating Note:  Shela Leff MSW,LCSW Date/Time: Initiated:  08/14/17/      Child's Name:      Biological Parents:  Mother   Need for Interpreter:  None   Reason for Referral:  Other (Comment)(resources/support systems)   Address:  [place] [address]    Phone number:  [phone] (home)     Additional phone number: none  Household Members/Support Persons (HM/SP):       HM/SP Name Relationship DOB or Age  HM/SP -1        HM/SP -2        HM/SP -3        HM/SP -4        HM/SP -5        HM/SP -6        HM/SP -7        HM/SP -8          Natural Supports (not living in the home):  Extended Family   Professional Supports: None   Employment: Full-time   Type of Work:     Education:  Programmer, systems   Homebound arranged:    Museum/gallery curator Resources:  Medicaid   Other Resources:  Physicist, medical , [place] Considerations Which May Impact Care:  none  Strengths:  Ability to meet basic needs , Compliance with medical plan , Home prepared for child    Psychotropic Medications:         Pediatrician:       Pediatrician List:   [place]      Pediatrician Fax Number:    Risk Factors/Current Problems:  None   Cognitive State:  Alert , Able to Concentrate    Mood/Affect:  Happy , Calm    CSW Assessment: CSW met with patient this morning. Patient was actively caring for her newborn and was feeding and comforting her newborn. CSW introduced self and explained role and purpose of visit. Patient stated that in the home is just her and her 26 year old child. Patient states she lives about a mile away from her mother. She was stating that her 49 year old was being  looked after by maternal grandmother while patient's mother worked on moving patient's belongings into her new residence. She was worried that it would be too much for her but she stated that her mother was able to get her 27 year old after a couple of days. Patient states she has all necessities for her newborn. She states that she has food stamps and wic. She works full time. She has transportation.She denied any mental health history, or drug and alcohol abuse. CSW discussed postpartum depression education and patient verbalized understanding. No further needs.  CSW Plan/Description:  No Further Intervention Required/No Barriers to Discharge    Shela Leff, LCSW 08/14/2017, 8:55 AM

## 2017-08-14 NOTE — Plan of Care (Signed)
Vs stable; up ad lib; taking motrin for pain control; kpad to back has helped as well; probable discharge today

## 2017-08-16 NOTE — Discharge Summary (Signed)
Physician Discharge Summary   Patient ID: Molly Bray [id number] 21 y.o. 09/02/1995  Admit date: 08/03/2017  Discharge date and time: 08/03/2017  2:32 AM   Admitting Physician: Natale Milchhristanna R Schuman, MD   Discharge Physician: Adelene Idlerhristanna Schuman  Admission Diagnoses: No admission diagnoses are documented for this encounter.  Discharge Diagnoses: back pain  Admission Condition: stable  Discharged Condition: stable  Indication for Admission: Triage visit  Hospital Course: Please see nursing notes  Consults: None  Significant Diagnostic Studies: none, refused UA  Treatments: none  Disposition: 01-Home or Self Care  Patient Instructions:  Activity: activity as tolerated Diet: regular diet Wound Care: none needed  Follow-up with [facility] in 1 week.  Signed: Natale MilchChristanna R Schuman 08/16/2017 12:56 AM

## 2017-08-20 ENCOUNTER — Ambulatory Visit: Payer: Medicaid Other | Admitting: Obstetrics and Gynecology

## 2017-08-23 ENCOUNTER — Ambulatory Visit (INDEPENDENT_AMBULATORY_CARE_PROVIDER_SITE_OTHER): Payer: Medicaid Other | Admitting: Obstetrics and Gynecology

## 2017-08-23 ENCOUNTER — Encounter: Payer: Self-pay | Admitting: Obstetrics and Gynecology

## 2017-08-23 NOTE — Progress Notes (Signed)
  OBSTETRICS POSTPARTUM CLINIC PROGRESS NOTE  Subjective:     Molly Bray is a 22 y.o. [phone] female who presents for a postpartum visit. She is 2 week postpartum following a Term pregnancy and delivery by Vaginal, no problems at delivery.  I have fully reviewed the prenatal and intrapartum course. Anesthesia: epidural.  Postpartum course has been complicated by uncomplicated.  Baby is feeding by Bottle.  Bleeding: patient has not  resumed menses.  Bowel function is normal. Bladder function is normal.  Patient is not sexually active. Contraception method desired is IUD.  Postpartum depression screening: negative. Edinburgh 7.  The following portions of the patient's history were reviewed and updated as appropriate: allergies, current medications, past family history, past medical history, past social history, past surgical history and problem list.  Review of Systems Pertinent items are noted in HPI.  Objective:    BP 90/60   Pulse (!) 102   Ht 5\' 6"  (1.676 m)   Wt 149 lb (67.6 kg)   LMP 10/27/2016   BMI 24.05 kg/m   General:  alert and no distress   Breasts:  inspection negative, no nipple discharge or bleeding, no masses or nodularity palpable  Lungs: clear to auscultation bilaterally  Heart:  regular rate and rhythm, S1, S2 normal, no murmur, click, rub or gallop  Abdomen: soft, non-tender; bowel sounds normal; no masses,  no organomegaly.     Vulva:  normal  Vagina: normal vagina, no discharge, exudate, lesion, or erythema  Cervix:  no cervical motion tenderness and no lesions  Corpus: normal size, contour, position, consistency, mobility, non-tender  Adnexa:  normal adnexa and no mass, fullness, tenderness  Rectal Exam: Not performed. No external hemorrhoids seen.           Assessment:  Post Partum Care visit 1. Postpartum care following vaginal delivery    Plan:  See orders and Patient Instructions Patient given note to return to work. Mirena  insertion at that time  Follow up in: 4 week or as needed.   Adelene Idlerhristanna Emiley Digiacomo MD [facility], [facility] 08/23/2017  12:42 PM

## 2017-09-19 ENCOUNTER — Ambulatory Visit: Payer: Medicaid Other | Admitting: Advanced Practice Midwife

## 2017-09-25 ENCOUNTER — Ambulatory Visit: Payer: Medicaid Other | Admitting: Advanced Practice Midwife

## 2017-10-23 ENCOUNTER — Ambulatory Visit: Payer: Medicaid Other | Admitting: Advanced Practice Midwife

## 2017-11-14 ENCOUNTER — Ambulatory Visit (INDEPENDENT_AMBULATORY_CARE_PROVIDER_SITE_OTHER): Payer: Medicaid Other | Admitting: Advanced Practice Midwife

## 2017-11-14 ENCOUNTER — Encounter: Payer: Self-pay | Admitting: Advanced Practice Midwife

## 2017-11-14 VITALS — BP 124/78 | Ht 66.0 in | Wt 162.0 lb

## 2017-11-14 DIAGNOSIS — R1010 Upper abdominal pain, unspecified: Secondary | ICD-10-CM | POA: Diagnosis not present

## 2017-11-14 LAB — POCT URINALYSIS DIPSTICK
Bilirubin, UA: NEGATIVE
GLUCOSE UA: NEGATIVE
Ketones, UA: NEGATIVE
LEUKOCYTES UA: NEGATIVE
Nitrite, UA: NEGATIVE
Protein, UA: NEGATIVE
RBC UA: NEGATIVE
Spec Grav, UA: 1.015 (ref 1.010–1.025)
Urobilinogen, UA: NEGATIVE E.U./dL — AB
pH, UA: 7 (ref 5.0–8.0)

## 2017-11-15 ENCOUNTER — Encounter: Payer: Self-pay | Admitting: Advanced Practice Midwife
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
## 2018-01-23 ENCOUNTER — Ambulatory Visit: Payer: Medicaid Other | Admitting: Obstetrics and Gynecology

## 2018-01-29 ENCOUNTER — Ambulatory Visit: Payer: Medicaid Other | Admitting: Obstetrics and Gynecology

## 2018-02-04 ENCOUNTER — Ambulatory Visit: Payer: Medicaid Other | Admitting: Obstetrics and Gynecology

## 2018-02-13 ENCOUNTER — Ambulatory Visit: Payer: Medicaid Other | Admitting: Obstetrics and Gynecology

## 2018-02-25 ENCOUNTER — Encounter: Payer: Self-pay | Admitting: *Deleted

## 2018-02-25 ENCOUNTER — Emergency Department
Admission: EM | Admit: 2018-02-25 | Discharge: 2018-02-25 | Disposition: A | Discharge status: Home / Self Care | Payer: Medicaid Other | Attending: Emergency Medicine | Admitting: Emergency Medicine

## 2018-02-25 ENCOUNTER — Emergency Department: Payer: Medicaid Other

## 2018-02-25 ENCOUNTER — Other Ambulatory Visit: Payer: Self-pay

## 2018-02-25 DIAGNOSIS — Z79899 Other long term (current) drug therapy: Secondary | ICD-10-CM | POA: Diagnosis not present

## 2018-02-25 DIAGNOSIS — R109 Unspecified abdominal pain: Secondary | ICD-10-CM

## 2018-02-25 DIAGNOSIS — F17210 Nicotine dependence, cigarettes, uncomplicated: Secondary | ICD-10-CM | POA: Diagnosis not present

## 2018-02-25 DIAGNOSIS — K8020 Calculus of gallbladder without cholecystitis without obstruction: Secondary | ICD-10-CM | POA: Insufficient documentation

## 2018-02-25 DIAGNOSIS — R1084 Generalized abdominal pain: Secondary | ICD-10-CM | POA: Diagnosis present

## 2018-02-25 LAB — URINALYSIS, COMPLETE (UACMP) WITH MICROSCOPIC
BACTERIA UA: NONE SEEN
BILIRUBIN URINE: NEGATIVE
Glucose, UA: NEGATIVE mg/dL
HGB URINE DIPSTICK: NEGATIVE
Ketones, ur: NEGATIVE mg/dL
NITRITE: NEGATIVE
PROTEIN: NEGATIVE mg/dL
Specific Gravity, Urine: 1.019 (ref 1.005–1.030)
pH: 6 (ref 5.0–8.0)

## 2018-02-25 LAB — COMPREHENSIVE METABOLIC PANEL
ALBUMIN: 4.1 g/dL (ref 3.5–5.0)
ALT: 12 U/L (ref 0–44)
ANION GAP: 4 — AB (ref 5–15)
AST: 19 U/L (ref 15–41)
Alkaline Phosphatase: 76 U/L (ref 38–126)
BILIRUBIN TOTAL: 0.6 mg/dL (ref 0.3–1.2)
BUN: 13 mg/dL (ref 6–20)
CO2: 30 mmol/L (ref 22–32)
Calcium: 9.3 mg/dL (ref 8.9–10.3)
Chloride: 106 mmol/L (ref 98–111)
Creatinine, Ser: 0.64 mg/dL (ref 0.44–1.00)
GFR calc Af Amer: >60 mL/min (ref >60)
GFR calc non Af Amer: >60 mL/min (ref >60)
GLUCOSE: 100 mg/dL — AB (ref 70–99)
Potassium: 4.4 mmol/L (ref 3.5–5.1)
SODIUM: 140 mmol/L (ref 135–145)
TOTAL PROTEIN: 6.8 g/dL (ref 6.5–8.1)

## 2018-02-25 LAB — LIPASE, BLOOD: Lipase: 30 U/L (ref 11–51)

## 2018-02-25 LAB — CBC
HEMATOCRIT: 40.9 % (ref 35.0–47.0)
Hemoglobin: 14 g/dL (ref 12.0–16.0)
MCH: 31.5 pg (ref 26.0–34.0)
MCHC: 34.3 g/dL (ref 32.0–36.0)
MCV: 91.8 fL (ref 80.0–100.0)
Platelets: 249 10*3/uL (ref 150–440)
RBC: 4.45 MIL/uL (ref 3.80–5.20)
RDW: 14 % (ref 11.5–14.5)
WBC: 8.6 10*3/uL (ref 3.6–11.0)

## 2018-02-25 LAB — POCT PREGNANCY, URINE: PREG TEST UR: NEGATIVE

## 2018-02-25 MED ORDER — ONDANSETRON HCL 4 MG/2ML IJ SOLN
4.0000 mg | Freq: Once | INTRAMUSCULAR | Status: AC
  Administered 2018-02-25: 4 mg via INTRAVENOUS
  Filled 2018-02-25: qty 2

## 2018-02-25 MED ORDER — KETOROLAC TROMETHAMINE 30 MG/ML IJ SOLN
30.0000 mg | Freq: Once | INTRAMUSCULAR | Status: AC
  Administered 2018-02-25: 30 mg via INTRAVENOUS
  Filled 2018-02-25: qty 1

## 2018-02-25 MED ORDER — ONDANSETRON 4 MG PO TBDP: 4.0000 mg | ORAL_TABLET | Freq: Three times a day (TID) | ORAL | 0 refills | Status: DC | PRN

## 2018-02-25 NOTE — ED Triage Notes (Signed)
Pt reports sudden onset of pain beneath right ribs.  Pt was awakened with pain.  Pt also reports nausea.   Pain radiates into back.  Pt alert.

## 2018-02-25 NOTE — Discharge Instructions (Addendum)
Please follow up with surgeon to schedule removal of your gallbladder

## 2018-02-25 NOTE — ED Provider Notes (Signed)
[facility] Emergency Department Provider Note   ____________________________________________   First MD Initiated Contact with Patient 02/25/18 0215     (approximate)  I have reviewed the triage vital signs and the nursing notes.   HISTORY  Chief Complaint Abdominal Pain    HPI Molly Bray is a 22 y.o. female who comes into the hospital today with some abdominal pain.  The patient woke up to go to the bathroom when she laid down she developed some right-sided abdominal pain that went into her back.  The patient reports that she started feeling sick so she decided to come into the hospital for evaluation.  The patient was nauseous but did not vomit.  She had no fevers and rates her pain 8 out of 10 intensity currently.  The patient does have a history of gallstones and reports she was supposed to have surgery a year ago but she could not take time off work.  The patient states that when they discovered her gallstones she did not have pain so this is worse than previous.  She is here today for evaluation.   Past Medical History:  Diagnosis Date  . Anxiety   . Depression   . Pyelonephritis   . Recurrent UTI     Patient Active Problem List   Diagnosis Date Noted  . Indication for care in labor and delivery, antepartum 08/12/2017  . Postpartum care following vaginal delivery 08/12/2017  . Pregnancy 08/03/2017  . History of preterm delivery, currently pregnant 01/09/2017    Past Surgical History:  Procedure Laterality Date  . KIDNEY SURGERY    . KIDNEY SURGERY      Prior to Admission medications   Medication Sig Start Date End Date Taking? Authorizing Provider  DULoxetine (CYMBALTA) 30 MG capsule  10/18/17   [provider]  gabapentin (NEURONTIN) 400 MG capsule  10/18/17   [provider]  hydrOXYzine (VISTARIL) 25 MG capsule  10/18/17   [provider]  ibuprofen (ADVIL,MOTRIN) 600 MG tablet Take 1 tablet (600 mg  total) by mouth every 6 (six) hours. Patient not taking: Reported on 08/23/2017 08/14/17   Conard NovakJackson, Stephen D, MD  meloxicam [facility](MOBIC) 7.5 MG tablet  10/18/17   [provider]  ondansetron (ZOFRAN ODT) 4 MG disintegrating tablet Take 1 tablet (4 mg total) by mouth every 8 (eight) hours as needed for nausea or vomiting. 02/25/18   Rebecka ApleyWebster, Roy Tokarz P, MD    Allergies Amoxicillin and Penicillins  Family History  Problem Relation Age of Onset  . Diabetes Maternal Aunt   . Hypertension Neg Hx   . Ovarian cancer Neg Hx   . Breast cancer Neg Hx   . Heart disease Neg Hx     Social History Social History   Tobacco Use  . Smoking status: Current Every Day Smoker    Packs/day: 0.50    Types: Cigarettes  . Smokeless tobacco: Never Used  Substance Use Topics  . Alcohol use: No  . Drug use: No    Review of Systems  Constitutional: No fever/chills Eyes: No visual changes. ENT: No sore throat. Cardiovascular: Denies chest pain. Respiratory: Denies shortness of breath. Gastrointestinal:  abdominal pain.   nausea, no vomiting.   Genitourinary: Negative for dysuria. Musculoskeletal: Negative for back pain. Skin: Negative for rash. Neurological: Negative for headaches   ____________________________________________   PHYSICAL EXAM:  VITAL SIGNS: ED Triage Vitals [02/25/18 0115]  Enc Vitals Group     BP 124/79  Pulse Rate 91     Resp 20     Temp 98.9 F (37.2 C)     Temp Source Oral     SpO2 99 %     Weight 170 lb (77.1 kg)     Height 5\' 6"  (1.676 m)     Head Circumference      Peak Flow      Pain Score 9     Pain Loc      Pain Edu?      Excl. in GC?     Constitutional: Alert and oriented. Well appearing and in moderate distress. Eyes: Conjunctivae are normal. PERRL. EOMI. Head: Atraumatic. Nose: No congestion/rhinnorhea. Mouth/Throat: Mucous membranes are moist.  Oropharynx non-erythematous. Cardiovascular: Normal rate, regular rhythm. Grossly normal heart  sounds.  Good peripheral circulation. Respiratory: Normal respiratory effort.  No retractions. Lungs CTAB. Gastrointestinal: Soft with some right upper quadrant tenderness to palpation. No distention.  Positive bowel sounds. Musculoskeletal: No lower extremity tenderness nor edema.  Neurologic:  Normal speech and language.  Skin:  Skin is warm, dry and intact. Psychiatric: Mood and affect are normal.   ____________________________________________   LABS (all labs ordered are listed, but only abnormal results are displayed)  Labs Reviewed  URINALYSIS, COMPLETE (UACMP) WITH MICROSCOPIC - Abnormal; Notable for the following components:      Result Value   Color, Urine YELLOW (*)    APPearance HAZY (*)    Leukocytes, UA MODERATE (*)    All other components within normal limits  COMPREHENSIVE METABOLIC PANEL - Abnormal; Notable for the following components:   Glucose, Bld 100 (*)    Anion gap 4 (*)    All other components within normal limits  CBC  LIPASE, BLOOD  POC URINE PREG, ED  POCT PREGNANCY, URINE   ____________________________________________  EKG  none ____________________________________________  RADIOLOGY  ED MD interpretation: Right upper quadrant ultrasound: Cholelithiasis without additional changes to suggest cholecystitis.  Official radiology report(s): Koreas Abdomen Limited Ruq  Result Date: 02/25/2018 CLINICAL DATA:  Right upper quadrant pain radiating to the back. Nausea. Symptoms tonight. EXAM: ULTRASOUND ABDOMEN LIMITED RIGHT UPPER QUADRANT COMPARISON:  CT abdomen and pelvis 03/16/2016 FINDINGS: Gallbladder: Cholelithiasis with stones in the gallbladder neck measuring up to 1.9 cm. Layering sludge in the gallbladder. No gallbladder wall thickening or edema. Murphy's sign is negative. Common bile duct: Diameter: 4.9 mm, normal Liver: No focal lesion identified. Within normal limits in parenchymal echogenicity. Portal vein is patent on color Doppler imaging with  normal direction of blood flow towards the liver. IMPRESSION: Cholelithiasis without additional changes to suggest cholecystitis. Electronically Signed   By: Burman NievesWilliam  Stevens M.D.   On: 02/25/2018 04:30    ____________________________________________   PROCEDURES  Procedure(s) performed: None  Procedures  Critical Care performed: No  ____________________________________________   INITIAL IMPRESSION / ASSESSMENT AND PLAN / ED COURSE  As part of my medical decision making, I reviewed the following data within the electronic MEDICAL RECORD NUMBER Notes from prior ED visits and [place] Controlled Substance Database   This is a 22 year old female who comes into the hospital today with some abdominal pain.  The patient does have a history of gallstones.  She is feeling nauseous.  I did check some blood work on the patient to include a CBC and a CMP as well as a lipase.  The patient's blood work is unremarkable.  She will be sent to receive an ultrasound looking at her gallbladder.  The patient's ultrasound did not show  any signs of cholecystitis.  I gave the patient a shot of Toradol and some Zofran.  She will be discharged home to follow-up with surgery for further evaluation and to schedule a cholecystectomy.      ____________________________________________   FINAL CLINICAL IMPRESSION(S) / ED DIAGNOSES  Final diagnoses:  Abdominal pain  Calculus of gallbladder without cholecystitis without obstruction     ED Discharge Orders         Ordered    ondansetron (ZOFRAN ODT) 4 MG disintegrating tablet  Every 8 hours PRN     02/25/18 0503           Note:  This document was prepared using Dragon voice recognition software and may include unintentional dictation errors.    Rebecka Apley, MD 02/25/18 [phone]

## 2018-02-25 NOTE — ED Notes (Signed)
Pt uprite on stretcher in exam room with no distress noted; reports right upper abd pain radiating into back accomp by nausea tonight; denies hx of same; +BS, abd soft/nondist/nontender

## 2018-03-04 ENCOUNTER — Ambulatory Visit: Payer: Self-pay | Admitting: Surgery

## 2018-03-04 NOTE — H&P (Signed)
CC: Cholecystitis, chronic [K81.1]  HPI:  Molly Bray is a 22 y.o. female who was referred by [facility] for evaluation of above CC. Symptoms were first noted a few years ago. Pain is sharp, achy and intermittent, radiating from the RUQ area, to the right shoulder.  Associated with nausea, usually occurs at night, resolves after some time.  Exacerbated by nothing specific.     Past Medical History:  has a past medical history of Anxiety and Urinary reflux.  Past Surgical History:  has no past surgical history on file. but reports some "kidney" surgery  Family History: non-contributory  Social History:  reports that she has been smoking cigarettes.  She has been smoking about 1.00 pack per day. She has never used smokeless tobacco. She reports that she does not drink alcohol. Her drug history is not on file.  Current Medications: has a current medication list which includes the following prescription(s): cyclobenzaprine, duloxetine, gabapentin, hydroxyzine pamoate, ibuprofen, meloxicam, and metronidazole.  Allergies:       Allergies as of 02/28/2018 - Reviewed 02/28/2018  Allergen Reaction Noted  . Penicillins Anaphylaxis and Swelling 03/05/2015  . Amoxicillin Unknown 02/28/2018    ROS:  A 15 point review of systems was performed and pertinent positives and negatives noted in HPI    Objective:   BP 111/72   Pulse 103   Temp 37.2 C (98.9 F) (Oral)   Ht 164.5 cm (5' 4.76")   Wt 78.1 kg (172 lb 3.2 oz)   LMP 02/04/2018 (Exact Date)   BMI 28.87 kg/m    Constitutional :  alert, appears stated age, cooperative and no distress  Lymphatics/Throat:  no asymmetry, masses, or scars  Respiratory:  clear to auscultation bilaterally  Cardiovascular:  regular rate and rhythm, S1, S2 normal, no murmur, click, rub or gallop and regular rate and rhythm  Gastrointestinal: soft, but focal tenderness in RUQ, with some in periumbilical region.  complains of pain in  R shoulder with palpation of RUQ.  small midline incision noted infraumbilical region.    Musculoskeletal: Steady gait and movement  Skin: Cool and moist  Psychiatric: Normal affect, non-agitated, not confused       LABS:  -latest from ED visit few days ago all wnl   RADS: US from few days ago with cholelithiasis but no acute cholecystitis, no CBD dilation Assessment:      Cholecystitis, chronic [K81.1]  Plan:   1. Cholecystitis, chronic [K81.1] Discussed the risk of surgery including post-op infxn, seroma, biloma, chronic pain, poor-delayed wound healing, retained gallstone, conversion to open procedure, post-op SBO or ileus, and need for additional procedures to address said risks.  The risks of general anesthetic including MI, CVA, sudden death or even reaction to anesthetic medications also discussed. Alternatives include continued observation.  Benefits include possible symptom relief, prevention of complications including acute cholecystitis, pancreatitis.  Typical post operative recovery of 3-5 days rest, continued pain in area and incision sites, possible loose stools up to 4-6 weeks, also discussed.  ED return precautions given for sudden increase in RUQ pain, with possible accompanying fever, nausea, and/or vomiting.  The patient understands the risks, any and all questions were answered to the patient's satisfaction.  2. Patient has elected to proceed with surgical treatment. Procedure will be scheduled.  Written consent was obtained.  We did discuss HIDA for further objective studies, but she declined and states she will like to proceed with surgery.  I made no guarantees that pain will go  away but stated more than likely it will resolve the issue.     Electronically signed by Pablo Mathurin, DO on 02/28/2018 10:27 AM     

## 2018-03-04 NOTE — H&P (View-Only) (Signed)
CC: Cholecystitis, chronic [K81.1]  HPI:  Molly Bray is a 22 y.o. female who was referred by [facility] for evaluation of above CC. Symptoms were first noted a few years ago. Pain is sharp, achy and intermittent, radiating from the RUQ area, to the right shoulder.  Associated with nausea, usually occurs at night, resolves after some time.  Exacerbated by nothing specific.     Past Medical History:  has a past medical history of Anxiety and Urinary reflux.  Past Surgical History:  has no past surgical history on file. but reports some "kidney" surgery  Family History: non-contributory  Social History:  reports that she has been smoking cigarettes.  She has been smoking about 1.00 pack per day. She has never used smokeless tobacco. She reports that she does not drink alcohol. Her drug history is not on file.  Current Medications: has a current medication list which includes the following prescription(s): cyclobenzaprine, duloxetine, gabapentin, hydroxyzine pamoate, ibuprofen, meloxicam, and metronidazole.  Allergies:       Allergies as of 02/28/2018 - Reviewed 02/28/2018  Allergen Reaction Noted  . Penicillins Anaphylaxis and Swelling 03/05/2015  . Amoxicillin Unknown 02/28/2018    ROS:  A 15 point review of systems was performed and pertinent positives and negatives noted in HPI    Objective:   BP 111/72   Pulse 103   Temp 37.2 C (98.9 F) (Oral)   Ht 164.5 cm (5' 4.76")   Wt 78.1 kg (172 lb 3.2 oz)   LMP 02/04/2018 (Exact Date)   BMI 28.87 kg/m    Constitutional :  alert, appears stated age, cooperative and no distress  Lymphatics/Throat:  no asymmetry, masses, or scars  Respiratory:  clear to auscultation bilaterally  Cardiovascular:  regular rate and rhythm, S1, S2 normal, no murmur, click, rub or gallop and regular rate and rhythm  Gastrointestinal: soft, but focal tenderness in RUQ, with some in periumbilical region.  complains of pain in  R shoulder with palpation of RUQ.  small midline incision noted infraumbilical region.    Musculoskeletal: Steady gait and movement  Skin: Cool and moist  Psychiatric: Normal affect, non-agitated, not confused       LABS:  -latest from ED visit few days ago all wnl   RADS: US from few days ago with cholelithiasis but no acute cholecystitis, no CBD dilation Assessment:      Cholecystitis, chronic [K81.1]  Plan:   1. Cholecystitis, chronic [K81.1] Discussed the risk of surgery including post-op infxn, seroma, biloma, chronic pain, poor-delayed wound healing, retained gallstone, conversion to open procedure, post-op SBO or ileus, and need for additional procedures to address said risks.  The risks of general anesthetic including MI, CVA, sudden death or even reaction to anesthetic medications also discussed. Alternatives include continued observation.  Benefits include possible symptom relief, prevention of complications including acute cholecystitis, pancreatitis.  Typical post operative recovery of 3-5 days rest, continued pain in area and incision sites, possible loose stools up to 4-6 weeks, also discussed.  ED return precautions given for sudden increase in RUQ pain, with possible accompanying fever, nausea, and/or vomiting.  The patient understands the risks, any and all questions were answered to the patient's satisfaction.  2. Patient has elected to proceed with surgical treatment. Procedure will be scheduled.  Written consent was obtained.  We did discuss HIDA for further objective studies, but she declined and states she will like to proceed with surgery.  I made no guarantees that pain will go  away but stated more than likely it will resolve the issue.     Electronically signed by Sung AmabileSakai, Thresea Doble, DO on 02/28/2018 10:27 AM

## 2018-03-05 ENCOUNTER — Encounter
Admission: RE | Admit: 2018-03-05 | Discharge: 2018-03-05 | Disposition: A | Discharge status: Home / Self Care | Payer: Medicaid Other | Source: Ambulatory Visit | Attending: Surgery | Admitting: Surgery

## 2018-03-05 ENCOUNTER — Other Ambulatory Visit: Payer: Self-pay

## 2018-03-05 HISTORY — DX: Headache: R51

## 2018-03-05 HISTORY — DX: Personal history of Methicillin resistant Staphylococcus aureus infection: Z86.14

## 2018-03-05 HISTORY — DX: Gastro-esophageal reflux disease without esophagitis: K21.9

## 2018-03-05 HISTORY — DX: Chronic kidney disease, unspecified: N18.9

## 2018-03-05 HISTORY — DX: Mononeuropathy, unspecified: G58.9

## 2018-03-05 HISTORY — DX: Headache, unspecified: R51.9

## 2018-03-05 NOTE — Patient Instructions (Signed)
Your procedure is scheduled on: 03-07-18 Report to Same Day Surgery 2nd floor medical mall [facility](Medical Mall Entrance-take elevator on left to 2nd floor.  Check in with surgery information desk.) To find out your arrival time please call [phone] between 1PM - 3PM on 03-06-18  Remember: Instructions that are not followed completely may result in serious medical risk, up to and including death, or upon the discretion of your surgeon and anesthesiologist your surgery may need to be rescheduled.    _x___ 1. Do not eat food after midnight the night before your procedure. You may drink clear liquids up to 2 hours before you are scheduled to arrive at the hospital for your procedure.  Do not drink clear liquids within 2 hours of your scheduled arrival to the hospital.  Clear liquids include  --Water or Apple juice without pulp  --Clear carbohydrate beverage such as ClearFast or Gatorade  --Black Coffee or Clear Tea (No milk, no creamers, do not add anything to the coffee or Tea Type 1 and type 2 diabetics should only drink water.   ____Ensure clear carbohydrate drink on the way to the hospital for bariatric patients  ____Ensure clear carbohydrate drink 3 hours before surgery for Dr Rutherford NailByrnett's patients if physician instructed.   No gum chewing or hard candies.     __x__ 2. No Alcohol for 24 hours before or after surgery.   __x__3. No Smoking or e-cigarettes for 24 prior to surgery.  Do not use any chewable tobacco products for at least 6 hour prior to surgery   ____  4. Bring all medications with you on the day of surgery if instructed.    __x__ 5. Notify your doctor if there is any change in your medical condition     (cold, fever, infections).    x___6. On the morning of surgery brush your teeth with toothpaste and water.  You may rinse your mouth with mouth wash if you wish.  Do not swallow any toothpaste or mouthwash.   Do not wear jewelry, make-up, hairpins, clips or nail polish.  Do  not wear lotions, powders, or perfumes. You may wear deodorant.  Do not shave 48 hours prior to surgery. Men may shave face and neck.  Do not bring valuables to the hospital.    [facility] is not responsible for any belongings or valuables.               Contacts, dentures or bridgework may not be worn into surgery.  Leave your suitcase in the car. After surgery it may be brought to your room.  For patients admitted to the hospital, discharge time is determined by your treatment team.  _  Patients discharged the day of surgery will not be allowed to drive home.  You will need someone to drive you home and stay with you the night of your procedure.    Please read over the following fact sheets that you were given:   [facility] Preparing [facility]   ____ Take anti-hypertensive listed below, cardiac, seizure, asthma, anti-reflux and psychiatric medicines. These include:  1. NONE  2.  3.  4.  5.  6.  ____Fleets enema or Magnesium Citrate as directed.   ____ Use CHG Soap or sage wipes as directed on instruction sheet   ____ Use inhalers on the day of surgery and bring to hospital day of surgery  ____ Stop Metformin and Janumet 2 days prior to surgery.    ____ Take 1/2 of  usual insulin dose the night before surgery and none on the morning surgery.   ____ Follow recommendations from Cardiologist, Pulmonologist or PCP regarding stopping Aspirin, Coumadin, Plavix ,Eliquis, Effient, or Pradaxa, and Pletal.  X____Stop Anti-inflammatories such as Advil, Aleve, Ibuprofen, Motrin, Naproxen, Naprosyn, Goodies powders or aspirin products NOW- OK to take Tylenol    ____ Stop supplements until after surgery.    ____ Bring C-Pap to the hospital.

## 2018-03-06 MED ORDER — VANCOMYCIN HCL IN DEXTROSE 1-5 GM/200ML-% IV SOLN
1000.0000 mg | INTRAVENOUS | Status: AC
  Administered 2018-03-07: 1000 mg via INTRAVENOUS

## 2018-03-07 ENCOUNTER — Encounter
Admission: RE | Disposition: A | Discharge status: Home / Self Care | Payer: Self-pay | Source: Ambulatory Visit | Attending: Surgery

## 2018-03-07 ENCOUNTER — Ambulatory Visit: Payer: Medicaid Other | Admitting: Anesthesiology

## 2018-03-07 ENCOUNTER — Ambulatory Visit
Admission: RE | Admit: 2018-03-07 | Discharge: 2018-03-07 | Disposition: A | Discharge status: Home / Self Care | Payer: Medicaid Other | Source: Ambulatory Visit | Attending: Surgery | Admitting: Surgery

## 2018-03-07 ENCOUNTER — Other Ambulatory Visit: Payer: Self-pay

## 2018-03-07 DIAGNOSIS — F418 Other specified anxiety disorders: Secondary | ICD-10-CM | POA: Insufficient documentation

## 2018-03-07 DIAGNOSIS — K8050 Calculus of bile duct without cholangitis or cholecystitis without obstruction: Secondary | ICD-10-CM | POA: Diagnosis present

## 2018-03-07 DIAGNOSIS — Z791 Long term (current) use of non-steroidal anti-inflammatories (NSAID): Secondary | ICD-10-CM | POA: Diagnosis not present

## 2018-03-07 DIAGNOSIS — Z87440 Personal history of urinary (tract) infections: Secondary | ICD-10-CM | POA: Diagnosis not present

## 2018-03-07 DIAGNOSIS — Z79899 Other long term (current) drug therapy: Secondary | ICD-10-CM | POA: Diagnosis not present

## 2018-03-07 DIAGNOSIS — Z87892 Personal history of anaphylaxis: Secondary | ICD-10-CM | POA: Insufficient documentation

## 2018-03-07 DIAGNOSIS — Z880 Allergy status to penicillin: Secondary | ICD-10-CM | POA: Insufficient documentation

## 2018-03-07 DIAGNOSIS — K8010 Calculus of gallbladder with chronic cholecystitis without obstruction: Secondary | ICD-10-CM | POA: Diagnosis not present

## 2018-03-07 DIAGNOSIS — F17210 Nicotine dependence, cigarettes, uncomplicated: Secondary | ICD-10-CM | POA: Diagnosis not present

## 2018-03-07 DIAGNOSIS — T368X5A Adverse effect of other systemic antibiotics, initial encounter: Secondary | ICD-10-CM | POA: Diagnosis not present

## 2018-03-07 DIAGNOSIS — Y92239 Unspecified place in hospital as the place of occurrence of the external cause: Secondary | ICD-10-CM | POA: Diagnosis not present

## 2018-03-07 DIAGNOSIS — K811 Chronic cholecystitis: Secondary | ICD-10-CM

## 2018-03-07 HISTORY — PX: CHOLECYSTECTOMY: SHX55

## 2018-03-07 LAB — POCT PREGNANCY, URINE: Preg Test, Ur: NEGATIVE

## 2018-03-07 SURGERY — LAPAROSCOPIC CHOLECYSTECTOMY
Anesthesia: Choice | Wound class: Clean Contaminated

## 2018-03-07 MED ORDER — IBUPROFEN 800 MG PO TABS: 800.0000 mg | ORAL_TABLET | Freq: Three times a day (TID) | ORAL | 0 refills | Status: DC | PRN

## 2018-03-07 MED ORDER — DIPHENHYDRAMINE HCL 50 MG/ML IJ SOLN
INTRAMUSCULAR | Status: AC
  Filled 2018-03-07: qty 1

## 2018-03-07 MED ORDER — MIDAZOLAM HCL 2 MG/2ML IJ SOLN
INTRAMUSCULAR | Status: DC | PRN
  Administered 2018-03-07: 2 mg via INTRAVENOUS

## 2018-03-07 MED ORDER — LEVOFLOXACIN IN D5W 500 MG/100ML IV SOLN
INTRAVENOUS | Status: AC
  Administered 2018-03-07: 500 mg via INTRAVENOUS
  Filled 2018-03-07: qty 100

## 2018-03-07 MED ORDER — DEXMEDETOMIDINE HCL IN NACL 200 MCG/50ML IV SOLN
INTRAVENOUS | Status: DC | PRN
  Administered 2018-03-07: 12 ug via INTRAVENOUS

## 2018-03-07 MED ORDER — PROPOFOL 10 MG/ML IV BOLUS
INTRAVENOUS | Status: AC
  Filled 2018-03-07: qty 20

## 2018-03-07 MED ORDER — FAMOTIDINE 20 MG PO TABS
ORAL_TABLET | ORAL | Status: AC
  Filled 2018-03-07: qty 1

## 2018-03-07 MED ORDER — ROCURONIUM BROMIDE 100 MG/10ML IV SOLN
INTRAVENOUS | Status: DC | PRN
  Administered 2018-03-07: 50 mg via INTRAVENOUS
  Administered 2018-03-07: 20 mg via INTRAVENOUS

## 2018-03-07 MED ORDER — SUGAMMADEX SODIUM 200 MG/2ML IV SOLN
INTRAVENOUS | Status: AC
  Filled 2018-03-07: qty 2

## 2018-03-07 MED ORDER — LACTATED RINGERS IV SOLN
INTRAVENOUS | Status: DC | PRN
  Administered 2018-03-07 (×2): via INTRAVENOUS

## 2018-03-07 MED ORDER — FENTANYL CITRATE (PF) 100 MCG/2ML IJ SOLN
INTRAMUSCULAR | Status: AC
  Filled 2018-03-07: qty 2

## 2018-03-07 MED ORDER — FAMOTIDINE 20 MG PO TABS
20.0000 mg | ORAL_TABLET | Freq: Once | ORAL | Status: AC
  Administered 2018-03-07: 20 mg via ORAL

## 2018-03-07 MED ORDER — HYDROCODONE-ACETAMINOPHEN 5-325 MG PO TABS
ORAL_TABLET | ORAL | Status: AC
  Administered 2018-03-07: 1
  Filled 2018-03-07: qty 1

## 2018-03-07 MED ORDER — LIDOCAINE-EPINEPHRINE (PF) 1 %-1:200000 IJ SOLN
INTRAMUSCULAR | Status: DC | PRN
  Administered 2018-03-07: 10 mL

## 2018-03-07 MED ORDER — LACTATED RINGERS IV SOLN
INTRAVENOUS | Status: DC
  Administered 2018-03-07: 11:00:00 via INTRAVENOUS

## 2018-03-07 MED ORDER — ONDANSETRON HCL 4 MG/2ML IJ SOLN: 4.0000 mg | Freq: Once | INTRAMUSCULAR | Status: DC | PRN

## 2018-03-07 MED ORDER — LIDOCAINE HCL (PF) 2 % IJ SOLN
INTRAMUSCULAR | Status: AC
  Filled 2018-03-07: qty 10

## 2018-03-07 MED ORDER — BUPIVACAINE-EPINEPHRINE (PF) 0.5% -1:200000 IJ SOLN
INTRAMUSCULAR | Status: AC
  Filled 2018-03-07: qty 30

## 2018-03-07 MED ORDER — ALBUTEROL SULFATE HFA 108 (90 BASE) MCG/ACT IN AERS
INHALATION_SPRAY | RESPIRATORY_TRACT | Status: DC | PRN
  Administered 2018-03-07: 6 via RESPIRATORY_TRACT

## 2018-03-07 MED ORDER — ROCURONIUM BROMIDE 50 MG/5ML IV SOLN
INTRAVENOUS | Status: AC
  Filled 2018-03-07: qty 1

## 2018-03-07 MED ORDER — SUGAMMADEX SODIUM 200 MG/2ML IV SOLN
INTRAVENOUS | Status: DC | PRN
  Administered 2018-03-07: 200 mg via INTRAVENOUS

## 2018-03-07 MED ORDER — GLYCOPYRROLATE 0.2 MG/ML IJ SOLN
INTRAMUSCULAR | Status: DC | PRN
  Administered 2018-03-07: .2 mg via INTRAVENOUS

## 2018-03-07 MED ORDER — LIDOCAINE HCL (CARDIAC) PF 100 MG/5ML IV SOSY
PREFILLED_SYRINGE | INTRAVENOUS | Status: DC | PRN
  Administered 2018-03-07: 60 mg via INTRAVENOUS

## 2018-03-07 MED ORDER — ONDANSETRON HCL 4 MG/2ML IJ SOLN
INTRAMUSCULAR | Status: AC
  Filled 2018-03-07: qty 2

## 2018-03-07 MED ORDER — LEVOFLOXACIN IN D5W 500 MG/100ML IV SOLN
500.0000 mg | INTRAVENOUS | Status: DC
  Administered 2018-03-07: 500 mg via INTRAVENOUS

## 2018-03-07 MED ORDER — KETOROLAC TROMETHAMINE 30 MG/ML IJ SOLN
INTRAMUSCULAR | Status: AC
  Filled 2018-03-07: qty 1

## 2018-03-07 MED ORDER — DEXAMETHASONE SODIUM PHOSPHATE 10 MG/ML IJ SOLN
INTRAMUSCULAR | Status: AC
  Filled 2018-03-07: qty 1

## 2018-03-07 MED ORDER — PHENYLEPHRINE HCL 10 MG/ML IJ SOLN
INTRAMUSCULAR | Status: DC | PRN
  Administered 2018-03-07: 100 ug via INTRAVENOUS
  Administered 2018-03-07: 50 ug via INTRAVENOUS

## 2018-03-07 MED ORDER — PROPOFOL 10 MG/ML IV BOLUS
INTRAVENOUS | Status: DC | PRN
  Administered 2018-03-07: 150 mg via INTRAVENOUS

## 2018-03-07 MED ORDER — FENTANYL CITRATE (PF) 250 MCG/5ML IJ SOLN
INTRAMUSCULAR | Status: AC
  Filled 2018-03-07: qty 5

## 2018-03-07 MED ORDER — FENTANYL CITRATE (PF) 100 MCG/2ML IJ SOLN
25.0000 ug | INTRAMUSCULAR | Status: DC | PRN
  Administered 2018-03-07 (×4): 25 ug via INTRAVENOUS

## 2018-03-07 MED ORDER — HYDROCODONE-ACETAMINOPHEN 5-325 MG PO TABS: 1.0000 | ORAL_TABLET | Freq: Four times a day (QID) | ORAL | 0 refills | Status: AC | PRN

## 2018-03-07 MED ORDER — DOCUSATE SODIUM 100 MG PO CAPS: 100.0000 mg | ORAL_CAPSULE | Freq: Two times a day (BID) | ORAL | 0 refills | Status: AC | PRN

## 2018-03-07 MED ORDER — DEXAMETHASONE SODIUM PHOSPHATE 10 MG/ML IJ SOLN
INTRAMUSCULAR | Status: DC | PRN
  Administered 2018-03-07: 10 mg via INTRAVENOUS

## 2018-03-07 MED ORDER — ONDANSETRON HCL 4 MG/2ML IJ SOLN
INTRAMUSCULAR | Status: DC | PRN
  Administered 2018-03-07: 4 mg via INTRAVENOUS

## 2018-03-07 MED ORDER — CHLORHEXIDINE GLUCONATE CLOTH 2 % EX PADS
6.0000 | MEDICATED_PAD | Freq: Once | CUTANEOUS | Status: AC
  Administered 2018-03-07: 6 via TOPICAL

## 2018-03-07 MED ORDER — PHENYLEPHRINE HCL 10 MG/ML IJ SOLN
INTRAMUSCULAR | Status: DC | PRN
  Administered 2018-03-07 (×3): 100 ug via INTRAVENOUS

## 2018-03-07 MED ORDER — DIPHENHYDRAMINE HCL 50 MG/ML IJ SOLN
25.0000 mg | Freq: Once | INTRAMUSCULAR | Status: AC
  Administered 2018-03-07: 25 mg via INTRAVENOUS

## 2018-03-07 MED ORDER — KETOROLAC TROMETHAMINE 30 MG/ML IJ SOLN
INTRAMUSCULAR | Status: DC | PRN
  Administered 2018-03-07: 30 mg via INTRAVENOUS

## 2018-03-07 MED ORDER — LIDOCAINE HCL (PF) 1 % IJ SOLN
INTRAMUSCULAR | Status: AC
  Filled 2018-03-07: qty 30

## 2018-03-07 MED ORDER — MIDAZOLAM HCL 2 MG/2ML IJ SOLN
INTRAMUSCULAR | Status: AC
  Filled 2018-03-07: qty 2

## 2018-03-07 MED ORDER — FENTANYL CITRATE (PF) 100 MCG/2ML IJ SOLN
INTRAMUSCULAR | Status: DC | PRN
  Administered 2018-03-07 (×2): 50 ug via INTRAVENOUS
  Administered 2018-03-07: 100 ug via INTRAVENOUS

## 2018-03-07 MED ORDER — MIDAZOLAM HCL 2 MG/2ML IJ SOLN
INTRAMUSCULAR | Status: AC
  Filled 2018-03-07: qty 4

## 2018-03-07 MED ORDER — ESMOLOL HCL 100 MG/10ML IV SOLN
INTRAVENOUS | Status: DC | PRN
  Administered 2018-03-07: 60 mg via INTRAVENOUS
  Administered 2018-03-07: 40 mg via INTRAVENOUS

## 2018-03-07 SURGICAL SUPPLY — 55 items
APPLICATOR COTTON TIP 6 STRL (MISCELLANEOUS) IMPLANT
APPLICATOR COTTON TIP 6IN STRL (MISCELLANEOUS)
APPLIER CLIP 5 13 M/L LIGAMAX5 (MISCELLANEOUS) ×3
BLADE SURG 11 STRL SS SAFETY (MISCELLANEOUS) ×3 IMPLANT
CANISTER SUCT 1200ML W/VALVE (MISCELLANEOUS) ×3 IMPLANT
CHLORAPREP W/TINT 26ML (MISCELLANEOUS) ×3 IMPLANT
CHOLANGIOGRAM CATH TAUT (CATHETERS) IMPLANT
CLIP APPLIE 5 13 M/L LIGAMAX5 (MISCELLANEOUS) ×1 IMPLANT
DECANTER SPIKE VIAL GLASS SM (MISCELLANEOUS) ×6 IMPLANT
DEFOGGER SCOPE WARMER CLEARIFY (MISCELLANEOUS) IMPLANT
DERMABOND ADVANCED (GAUZE/BANDAGES/DRESSINGS) ×2
DERMABOND ADVANCED .7 DNX12 (GAUZE/BANDAGES/DRESSINGS) ×1 IMPLANT
DISSECTOR BLUNT TIP ENDO 5MM (MISCELLANEOUS) IMPLANT
DISSECTOR KITTNER STICK (MISCELLANEOUS) ×1 IMPLANT
DISSECTORS/KITTNER STICK (MISCELLANEOUS) ×3
DRAPE C-ARM XRAY 36X54 (DRAPES) IMPLANT
DRAPE SHEET LG 3/4 BI-LAMINATE (DRAPES) IMPLANT
ELECT CAUTERY BLADE 6.4 (BLADE) IMPLANT
ELECT REM PT RETURN 9FT ADLT (ELECTROSURGICAL) ×3
ELECTRODE REM PT RTRN 9FT ADLT (ELECTROSURGICAL) ×1 IMPLANT
GLOVE BIOGEL PI IND STRL 7.0 (GLOVE) ×1 IMPLANT
GLOVE BIOGEL PI INDICATOR 7.0 (GLOVE) ×2
GLOVE SURG SYN 6.5 ES PF (GLOVE) ×3 IMPLANT
GOWN STRL REUS W/ TWL LRG LVL3 (GOWN DISPOSABLE) ×3 IMPLANT
GOWN STRL REUS W/TWL LRG LVL3 (GOWN DISPOSABLE) ×6
GRASPER SUT TROCAR 14GX15 (MISCELLANEOUS) ×3 IMPLANT
IRRIGATION STRYKERFLOW (MISCELLANEOUS) IMPLANT
IRRIGATOR STRYKERFLOW (MISCELLANEOUS)
IV CATH ANGIO 12GX3 LT BLUE (NEEDLE) IMPLANT
IV NS 1000ML (IV SOLUTION) ×2
IV NS 1000ML BAXH (IV SOLUTION) ×1 IMPLANT
JACKSON PRATT 10 (INSTRUMENTS) IMPLANT
L-HOOK LAP DISP 36CM (ELECTROSURGICAL) ×3
LHOOK LAP DISP 36CM (ELECTROSURGICAL) ×1 IMPLANT
NEEDLE HYPO 22GX1.5 SAFETY (NEEDLE) ×3 IMPLANT
PACK LAP CHOLECYSTECTOMY (MISCELLANEOUS) ×3 IMPLANT
PENCIL ELECTRO HAND CTR (MISCELLANEOUS) ×3 IMPLANT
PORT ACCESS TROCAR AIRSEAL 5 (TROCAR) ×3 IMPLANT
POUCH SPECIMEN RETRIEVAL 10MM (ENDOMECHANICALS) ×3 IMPLANT
SCISSORS METZENBAUM CVD 33 (INSTRUMENTS) ×3 IMPLANT
SLEEVE ENDOPATH XCEL 5M (ENDOMECHANICALS) ×6 IMPLANT
SPONGE LAP 18X18 RF (DISPOSABLE) IMPLANT
STOPCOCK 4 WAY LG BORE MALE ST (IV SETS) IMPLANT
SUT MNCRL 4-0 (SUTURE) ×2
SUT MNCRL 4-0 27XMFL (SUTURE) ×1
SUT VIC AB 3-0 SH 27 (SUTURE)
SUT VIC AB 3-0 SH 27X BRD (SUTURE) IMPLANT
SUT VICRYL 0 AB UR-6 (SUTURE) ×6 IMPLANT
SUTURE MNCRL 4-0 27XMF (SUTURE) ×1 IMPLANT
SYR 20CC LL (SYRINGE) ×3 IMPLANT
TOWEL OR 17X26 4PK STRL BLUE (TOWEL DISPOSABLE) ×3 IMPLANT
TROCAR XCEL BLUNT TIP 100MML (ENDOMECHANICALS) ×3 IMPLANT
TROCAR XCEL NON-BLD 5MMX100MML (ENDOMECHANICALS) ×3 IMPLANT
TUBING INSUFFLATION (TUBING) ×3 IMPLANT
WATER STERILE IRR 1000ML POUR (IV SOLUTION) ×3 IMPLANT

## 2018-03-07 NOTE — Transfer of Care (Signed)
Immediate Anesthesia Transfer of Care Note  Patient: Molly Bray  Procedure(s) Performed: LAPAROSCOPIC CHOLECYSTECTOMY (N/A )  Patient Location: PACU  Anesthesia Type:General  Level of Consciousness: drowsy and patient cooperative  Airway & Oxygen Therapy: Patient Spontanous Breathing and Patient connected to face mask oxygen  Post-op Assessment: Report given to RN and Post -op Vital signs reviewed and stable  Post vital signs: Reviewed and stable  Last Vitals:  Vitals Value Taken Time  BP 126/69 03/07/2018  2:59 PM  Temp 37.1 C 03/07/2018  2:59 PM  Pulse 128 03/07/2018  3:02 PM  Resp 18 03/07/2018  3:02 PM  SpO2 100 % 03/07/2018  3:02 PM  Vitals shown include unvalidated device data.  Last Pain:  Vitals:   03/07/18 1459  TempSrc:   PainSc: Asleep         Complications: No apparent anesthesia complications

## 2018-03-07 NOTE — Progress Notes (Signed)
Pt reported itching, feeling flushed to Dr. Tonna BoehringerSakai.  Erythema observed at IV site, face, and neck.  Vancomycin infusion stopped.  Dr. Henrene HawkingKephart paged, pt received ordered IV Benadryl 25mg  for possible reaction to IV Vancomycin.  New IV tubing set hung, pt receiving IV Levaquin 500mg  per Dr. Tonna BoehringerSakai order.  Pt reports itchiness resolving.  WCTM.

## 2018-03-07 NOTE — Anesthesia Procedure Notes (Signed)
Procedure Name: Intubation Date/Time: 03/07/2018 1:10 PM Performed by: Jonna Clark, CRNA Pre-anesthesia Checklist: Patient identified, Patient being monitored, Timeout performed, Emergency Drugs available and Suction available Patient Re-evaluated:Patient Re-evaluated prior to induction Oxygen Delivery Method: Circle system utilized Preoxygenation: Pre-oxygenation with 100% oxygen Induction Type: IV induction Ventilation: Mask ventilation without difficulty Laryngoscope Size: Mac and 3 Grade View: Grade I Tube type: Oral Tube size: 7.0 mm Number of attempts: 1 Airway Equipment and Method: Stylet Placement Confirmation: ETT inserted through vocal cords under direct vision,  positive ETCO2 and breath sounds checked- equal and bilateral Secured at: 21 cm Tube secured with: Tape Dental Injury: Teeth and Oropharynx as per pre-operative assessment

## 2018-03-07 NOTE — Interval H&P Note (Signed)
History and Physical Interval Note:  03/07/2018 12:10 PM  Molly Bray  has presented today for surgery, with the diagnosis of CHOLECYSTITIS CHRONIC  The various methods of treatment have been discussed with the patient and family. After consideration of risks, benefits and other options for treatment, the patient has consented to  Procedure(s): LAPAROSCOPIC CHOLECYSTECTOMY (N/A) as a surgical intervention .  The patient's history has been reviewed, patient examined, no change in status, stable for surgery.  I have reviewed the patient's chart and labs.  Questions were answered to the patient's satisfaction.    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  Clinically stable, symptoms resolved with discontinuation and benadryl.  Will switch to levaquin due to PCN allergy and continue with lap chole as planned as long as anesthesia is still ok to proceed   Molly Bray Tonna BoehringerSakai

## 2018-03-07 NOTE — Anesthesia Post-op Follow-up Note (Signed)
Anesthesia QCDR form completed.        

## 2018-03-07 NOTE — Anesthesia Preprocedure Evaluation (Signed)
Anesthesia Evaluation  Patient identified by MRN, date of birth, ID band Patient awake    Reviewed: Allergy & Precautions, NPO status , Patient's Chart, lab work & pertinent test results  History of Anesthesia Complications Negative for: history of anesthetic complications  Airway Mallampati: II       Dental   Pulmonary neg sleep apnea, neg COPD, Current Smoker,           Cardiovascular (-) hypertension(-) Past MI and (-) CHF (-) dysrhythmias (-) Valvular Problems/Murmurs     Neuro/Psych neg Seizures Anxiety Depression    GI/Hepatic Neg liver ROS, GERD  ,  Endo/Other  neg diabetes  Renal/GU Renal disease (Chronic renal reflux, reccurrent UTIs)     Musculoskeletal   Abdominal   Peds  Hematology   Anesthesia Other Findings   Reproductive/Obstetrics                             Anesthesia Physical Anesthesia Plan  ASA: II  Anesthesia Plan:    Post-op Pain Management:    Induction: Intravenous  PONV Risk Score and Plan: 1 and Ondansetron and Dexamethasone  Airway Management Planned: Oral ETT  Additional Equipment:   Intra-op Plan:   Post-operative Plan:   Informed Consent: I have reviewed the patients History and Physical, chart, labs and discussed the procedure including the risks, benefits and alternatives for the proposed anesthesia with the patient or authorized representative who has indicated his/her understanding and acceptance.     Plan Discussed with:   Anesthesia Plan Comments:         Anesthesia Quick Evaluation

## 2018-03-07 NOTE — Op Note (Signed)
Preoperative diagnosis:  biliary colic  Postoperative diagnosis: chronic cholecystitis  Procedure: Laparoscopic Cholecystectomy.   Anesthesia: GETA   Surgeon: Sung Amabile  Specimen: Gallbladder  Complications: None  EBL: 10mL  Wound Classification: Clean Contaminated  Indications: see HPI  Findings: Critical view of safety noted Cystic duct and artery identified, ligated and divided, clips remained intact at end of procedure Adequate hemostasis  Description of procedure: The patient was placed on the operating table in the supine position. SCDs placed, pre-op abx administered.  General anesthesia was induced and OG tube placed by anesthesia. A time-out was completed verifying correct patient, procedure, site, positioning, and implant(s) and/or special equipment prior to beginning this procedure. The abdomen was prepped and draped in the usual sterile fashion.  An incision was made in a natural skin line inferior to the umbilicus.  Dissection carried down to fascia where two 0 vicryl sutures placed to use as anchor sutures for hasson port.  Incision made into fascia and blunt dissection used to enter peritoneum.  Hasson port placed and insufflation started up to 15mm Hg without any dramatic increase in pressure.    The laparoscope was inserted and the abdomen inspected. No injuries from initial trocar placement were noted. Additional trocars were then inserted under direct visualization in the following locations: a 5-mm trocar in the subxyphoid region and two 5-mm trocars along the right costal margin. The abdomen was inspected and no abnormalities or injuries were found. The table was placed in the reverse Trendelenburg position with the right side up.  Filmy adhesions between the gallbladder and omentum, duodenum and transverse colon were lysed sharply. The dome of the gallbladder was grasped with an atraumatic grasper passed through the lateral port and retracted over the dome of the  liver. The infundibulum was also grasped with an atraumatic grasper and retracted toward the right lower quadrant. This maneuver exposed Calot's triangle. The peritoneum overlying the gallbladder infundibulum was then dissected and the cystic duct and cystic artery identified.  Critical view of safety with the liver bed clearly visible behind the duct and artery with no additional structures noted.  Picture taken before the cystic duct and cystic artery clipped and divided close to the gallbladder.  The gallbladder was then dissected from its peritoneal and liver bed attachments by electrocautery. Hemostasis was checked and the gallbladder were removed using an endoscopic retrieval bag placed through the umbilical port. The gallbladder was passed off the table as a specimen. There was no evidence of bleeding from the gallbladder fossa or cystic artery or leakage of the bile from the cystic duct stump. Abdomen desufflated The fascia of the Hasson trocar site was closed with interrupted 0 vicryl sutures. 3-0 vicryl used to close deep dermal layer at umbilical incision and all skin incisions were closed with subcuticular sutures of 4-0 monocryl and dressed with topical skin adhesive. The orogastric tube was removed and patient extubated. The patient tolerated the procedure well and was taken to the postanesthesia care unit in stable condition.  All sponge and instrument count correct at end of procedure.  Molly Bray, 51F  Powered by  Narx Report Resources Date: 03/07/2018  Download CSV Download PDF  Molly Bray  Risk Indicators  NARX SCORES Narcotic 020 Sedative 010 Stimulant 000 Explanation and Guidance OVERDOSE RISK SCORE   110  (Range 000-999) Explanation and Guidance ADDITIONAL RISK INDICATORS ( 0 )  Explanation and Guidance This NarxCare report is based on search criteria supplied and the data entered by the dispensing  pharmacy. For more information about any prescription,  please contact the dispensing pharmacy or the prescriber. NarxCare scores and reports are intended to aid, not replace, medical decision making. None of the information presented should be used as sole justification for providing or refusing to provide medications. The information on this report is not warranted as accurate or complete.  Graphs  RX GRAPH Narcotic Sedative Stimulant Other     All Prescribers      Timeline 03/07/2018 3165m 62860m 1y 2y  Prescribers 1 - Jade J Sung  Morphine MgEq (MME)    320 200 80 0    Timeline 08/23 1465m 56860m 1y 2y  *Per CDC guidance, the MME conversion factors prescribed or provided as part of the medication-assisted treatment for opioid use disorder should not be used to benchmark against dosage thresholds meant for opioids prescribed for pain. Buprenorphine products have no agreed upon morphine equivalency, and as partial opioid agonists, are not expected to be associated with overdose risk in the same dose-dependent manner as doses for full agonist opioids. MME = morphine milligram equivalents. LME = Lorazepam milligram equivalents. mg = dose in milligrams.   Summary  Summary  Total Prescriptions: 1 Total Prescribers: 1 Total Pharmacies: 1  Narcotics* (excluding buprenorphine)  Current Qty: 0 Current MME/day: 0.00 30 Day Avg MME/day: 0.00  Sedatives*  Current Qty: 0 Current LME/day: 0.00 30 Day Avg LME/day: 0.00  Buprenorphine*  Current Qty: 0 Current mg/day: 0.00 30 Day Avg mg/day: 0.00  Rx Data  PRESCRIPTIONS Total Prescriptions: 1 Total Private Pay: 0 Fill Date ID Written Drug Qty Days Prescriber Rx # Pharmacy Refill Daily Dose * Pymt Type PMP 11/20/2016  1  11/19/2016  Hydrocodone-Acetamin 5-325 Mg  15.00 4 Ja Sun  [id number]  [facility])  1/1 18.75 MME Medicaid  [place] *Per CDC guidance, the MME conversion factors prescribed or provided as part of the medication-assisted treatment for  opioid use disorder should not be used to benchmark against dosage thresholds meant for opioids prescribed for pain. Buprenorphine products have no agreed upon morphine equivalency, and as partial opioid agonists, are not expected to be associated with overdose risk in the same dose-dependent manner as doses for full agonist opioids. MME = morphine milligram equivalents. LME = Lorazepam milligram equivalents. mg = dose in milligrams.  Providers Total Providers: 1 Name Address [facility] [place] Zipcode Phone Irean HongJade J Sung [address] [place] [id number] - Pharmacies Total Pharmacies: 1 Name Address [facility] Zipcode Phone [facility] [phone] [place] [address] [phone]  Physician (MD, DO): The information in this system may contain errors resulting from how the information was entered into the data file. Controlled Substance Reporting System staff suggest that additional independent verification with pharmacies and practitioners may sometime be prudent or necessary.

## 2018-03-08 ENCOUNTER — Encounter: Payer: Self-pay | Admitting: Surgery

## 2018-03-08 ENCOUNTER — Emergency Department: Payer: Medicaid Other

## 2018-03-08 ENCOUNTER — Other Ambulatory Visit: Payer: Self-pay

## 2018-03-08 ENCOUNTER — Emergency Department
Admission: EM | Admit: 2018-03-08 | Discharge: 2018-03-08 | Disposition: A | Discharge status: Home / Self Care | Payer: Medicaid Other | Attending: Emergency Medicine | Admitting: Emergency Medicine

## 2018-03-08 DIAGNOSIS — G8918 Other acute postprocedural pain: Secondary | ICD-10-CM | POA: Insufficient documentation

## 2018-03-08 DIAGNOSIS — R109 Unspecified abdominal pain: Secondary | ICD-10-CM | POA: Diagnosis not present

## 2018-03-08 DIAGNOSIS — F17210 Nicotine dependence, cigarettes, uncomplicated: Secondary | ICD-10-CM | POA: Diagnosis not present

## 2018-03-08 DIAGNOSIS — N189 Chronic kidney disease, unspecified: Secondary | ICD-10-CM | POA: Insufficient documentation

## 2018-03-08 LAB — COMPREHENSIVE METABOLIC PANEL
ALBUMIN: 4 g/dL (ref 3.5–5.0)
ALT: 30 U/L (ref 0–44)
ANION GAP: 6 (ref 5–15)
AST: 33 U/L (ref 15–41)
Alkaline Phosphatase: 71 U/L (ref 38–126)
BUN: 14 mg/dL (ref 6–20)
CO2: 26 mmol/L (ref 22–32)
Calcium: 8.7 mg/dL — ABNORMAL LOW (ref 8.9–10.3)
Chloride: 108 mmol/L (ref 98–111)
Creatinine, Ser: 0.91 mg/dL (ref 0.44–1.00)
GFR calc Af Amer: >60 mL/min (ref >60)
Glucose, Bld: 109 mg/dL — ABNORMAL HIGH (ref 70–99)
POTASSIUM: 3.6 mmol/L (ref 3.5–5.1)
Sodium: 140 mmol/L (ref 135–145)
Total Bilirubin: 0.2 mg/dL — ABNORMAL LOW (ref 0.3–1.2)
Total Protein: 6.6 g/dL (ref 6.5–8.1)

## 2018-03-08 LAB — TROPONIN I: Troponin I: <0.03 ng/mL (ref <0.03)

## 2018-03-08 LAB — CBC
HCT: 41.2 % (ref 35.0–47.0)
Hemoglobin: 14 g/dL (ref 12.0–16.0)
MCH: 31.3 pg (ref 26.0–34.0)
MCHC: 34 g/dL (ref 32.0–36.0)
MCV: 92 fL (ref 80.0–100.0)
PLATELETS: 265 10*3/uL (ref 150–440)
RBC: 4.47 MIL/uL (ref 3.80–5.20)
RDW: 13.4 % (ref 11.5–14.5)
WBC: 12.1 10*3/uL — AB (ref 3.6–11.0)

## 2018-03-08 MED ORDER — ONDANSETRON HCL 4 MG/2ML IJ SOLN
4.0000 mg | Freq: Once | INTRAMUSCULAR | Status: AC
  Administered 2018-03-08: 4 mg via INTRAVENOUS
  Filled 2018-03-08: qty 2

## 2018-03-08 MED ORDER — MORPHINE SULFATE (PF) 4 MG/ML IV SOLN
4.0000 mg | Freq: Once | INTRAVENOUS | Status: AC
  Administered 2018-03-08: 4 mg via INTRAVENOUS
  Filled 2018-03-08: qty 1

## 2018-03-08 MED ORDER — OXYCODONE HCL 5 MG PO TABS: 5.0000 mg | ORAL_TABLET | Freq: Three times a day (TID) | ORAL | 0 refills | Status: DC | PRN

## 2018-03-08 NOTE — ED Triage Notes (Signed)
States had laparospopic cholecystectomy here yesterday. Mid abdominal pain and pain in chest with inspiration.

## 2018-03-08 NOTE — ED Provider Notes (Signed)
[facility] Emergency Department Provider Note  Time seen: 9:02 PM  I have reviewed the triage vital signs and the nursing notes.   HISTORY  Chief Complaint Abdominal Pain and Chest Pain    HPI Molly Bray is a 22 y.o. female with a past medical history of anxiety, CKD, depression, gastric reflux, presents to the emergency department for abdominal and chest pain.  According to the patient and record review patient had her gallbladder removed yesterday by Dr. Tonna Boehringer of general surgery.  Patient states since going home she has continued to have abdominal pain.  States that she was not read her discharge instructions prior to being discharged when she started reading them and said return to the emergency department if she has any abdominal pain or chest pain.  Patient states since the surgery she has been expensing pain across her entire abdomen as well as pain in her center of her chest.  Denies any fever.  No vomiting.  Has been taking her home Norco without significant relief.   Past Medical History:  Diagnosis Date  . Anxiety   . Chronic kidney disease    KIDNEY REFLUX  . Depression   . GERD (gastroesophageal reflux disease)    WITH PREGNANCY ONLY  . Headache    MIGRAINES  . History of methicillin resistant staphylococcus aureus (MRSA) 2008  . Pinched nerve   . Pyelonephritis   . Recurrent UTI     Patient Active Problem List   Diagnosis Date Noted  . Indication for care in labor and delivery, antepartum 08/12/2017  . Postpartum care following vaginal delivery 08/12/2017  . Pregnancy 08/03/2017  . History of preterm delivery, currently pregnant 01/09/2017    Past Surgical History:  Procedure Laterality Date  . CHOLECYSTECTOMY N/A 03/07/2018   Procedure: LAPAROSCOPIC CHOLECYSTECTOMY;  Surgeon: Sung Amabile, DO;  Location: [facility] ORS;  Service: General;  Laterality: N/A;  . KIDNEY SURGERY    . KIDNEY SURGERY      Prior to Admission  medications   Medication Sig Start Date End Date Taking? Authorizing Provider  AMITIZA 8 MCG capsule Take 8 mcg by mouth 2 (two) times daily as needed.  01/07/18   [provider]  cyclobenzaprine (FLEXERIL) 10 MG tablet Take 10 mg by mouth 3 (three) times daily as needed for muscle spasms. 01/07/18   [provider]  docusate sodium (COLACE) 100 MG capsule Take 1 capsule (100 mg total) by mouth 2 (two) times daily as needed for up to 10 days for mild constipation. 03/07/18 03/17/18  Tonna Boehringer, Isami, DO  gabapentin (NEURONTIN) 400 MG capsule Take 400 mg by mouth daily as needed.  10/18/17   [provider]  HYDROcodone-acetaminophen (NORCO) 5-325 MG tablet Take 1 tablet by mouth every 6 (six) hours as needed for up to 3 days for moderate pain. 03/07/18 03/10/18  Tonna Boehringer, Isami, DO  ibuprofen (ADVIL,MOTRIN) 800 MG tablet Take 1 tablet (800 mg total) by mouth every 8 (eight) hours as needed for mild pain or moderate pain. 03/07/18   Sung Amabile, DO     Family History  Problem Relation Age of Onset  . Diabetes Maternal Aunt   . Hypertension Neg Hx   . Ovarian cancer Neg Hx   . Breast cancer Neg Hx   . Heart disease Neg Hx     Social History Social History   Tobacco Use  . Smoking status: Current Every Day Smoker    Packs/day: 0.50    Years: 8.00  Pack years: 4.00    Types: Cigarettes  . Smokeless tobacco: Never Used  Substance Use Topics  . Alcohol use: No  . Drug use: No   Review of Systems Constitutional: Negative for fever. Cardiovascular: Mild central chest pain Respiratory: Negative for shortness of breath. Gastrointestinal: Positive for moderate diffuse abdominal pain. Genitourinary: Negative for urinary compaints Musculoskeletal: Negative for musculoskeletal complaints Skin: Negative for skin complaints  Neurological: Negative for headache All other ROS negative  ____________________________________________   PHYSICAL EXAM:  VITAL SIGNS: ED Triage  Vitals  Enc Vitals Group     BP 03/08/18 1926 117/75     Pulse Rate 03/08/18 1926 100     Resp 03/08/18 1926 18     Temp 03/08/18 1926 98.9 F (37.2 C)     Temp Source 03/08/18 1926 Oral     SpO2 03/08/18 1926 100 %     Weight 03/08/18 1929 170 lb (77.1 kg)     Height 03/08/18 1929 5\' 6"  (1.676 m)     Head Circumference --      Peak Flow --      Pain Score 03/08/18 1929 10     Pain Loc --      Pain Edu? --      Excl. in GC? --     Constitutional: Alert and oriented. Well appearing and in no distress. Eyes: Normal exam ENT   Head: Normocephalic and atraumatic.   Mouth/Throat: Mucous membranes are moist. Cardiovascular: Normal rate, regular rhythm. No murmur Respiratory: Normal respiratory effort without tachypnea nor retractions. Breath sounds are clear Gastrointestinal: Soft and nontender. No distention.  Musculoskeletal: Nontender with normal range of motion in all extremities. No lower extremity tenderness or edema. Neurologic:  Normal speech and language. No gross focal neurologic deficits  Skin:  Skin is warm, dry and intact.  Psychiatric: Mood and affect are normal.   ____________________________________________    EKG  EKG reviewed and interpreted by myself shows normal sinus rhythm 89 bpm with a narrow QRS, normal axis, normal intervals, no concerning ST changes.  ____________________________________________    RADIOLOGY  Chest x-ray shows free air under the diaphragm, however this is 1 day status post cholecystectomy.  ____________________________________________   INITIAL IMPRESSION / ASSESSMENT AND PLAN / ED COURSE  Pertinent labs & imaging results that were available during my care of the patient were reviewed by me and considered in my medical decision making (see chart for details).  Patient presents to the emergency department for abdominal pain and chest pain.  I discussed with the patient that we would expect her to have some abdominal pain  after the surgery the chest pain is central and states she has been coughing up thick mucus since the surgery as well this is likely due to general anesthesia and intubation for the surgery.  Patient's abdomen has mild diffuse tenderness without any significant or focal tenderness identified.  She is afebrile.  I believe it would be too early to look for complications such as abscess.  Patient's labs are reassuring.  I discussed with the patient trial of stronger pain medication and following up with her surgeon on Monday.  Patient agreeable to this plan of care.  I did discuss with the patient if her pain continues to worsen or she spikes a fever she is to return to the emergency department otherwise she will follow-up with her doctor on Monday.  I discussed discontinuing the Norco and will prescribe oxycodone for the patient.  I emphasized the  danger of taking this with Norco and she understands and agrees.  ____________________________________________   FINAL CLINICAL IMPRESSION(S) / ED DIAGNOSES  Postoperative pain    Minna Antis, MD 03/08/18 2107

## 2018-03-11 NOTE — Anesthesia Postprocedure Evaluation (Signed)
Anesthesia Post Note  Patient: Molly ManisElizabeth Renea Tapia  Procedure(s) Performed: LAPAROSCOPIC CHOLECYSTECTOMY (N/A )  Patient location during evaluation: PACU Anesthesia Type: General Level of consciousness: awake and alert Pain management: pain level controlled Vital Signs Assessment: post-procedure vital signs reviewed and stable Respiratory status: spontaneous breathing, nonlabored ventilation, respiratory function stable and patient connected to nasal cannula oxygen Cardiovascular status: blood pressure returned to baseline and stable Postop Assessment: no apparent nausea or vomiting Anesthetic complications: no     Last Vitals:  Vitals:   03/07/18 1600 03/07/18 1627  BP: (!) 97/59 108/73  Pulse: 100 100  Resp: 16 19  Temp: 36.5 C   SpO2:  99%    Last Pain:  Vitals:   03/07/18 1627  TempSrc:   PainSc: 4                  Yevette EdwardsJames G Darrious Youman

## 2018-03-12 LAB — SURGICAL PATHOLOGY

## 2018-04-08 IMAGING — US US OB COMP LESS 14 WK
1 series · 13 of 28 positions shown · non-contrast
Comparison: None.

CLINICAL DATA: Acute onset of lower abdominal and lower back pain.
Initial encounter.

EXAM:
OBSTETRIC <14 WK US AND TRANSVAGINAL OB US
TECHNIQUE: Both transabdominal and transvaginal ultrasound examinations were
performed for complete evaluation of the gestation as well as the
maternal uterus, adnexal regions, and pelvic cul-de-sac.
Transvaginal technique was performed to assess early pregnancy.

[Series 1: us ob comp less 14 wk · 0.17mm/px · 13 of 87 slices shown]
[im 4/87]
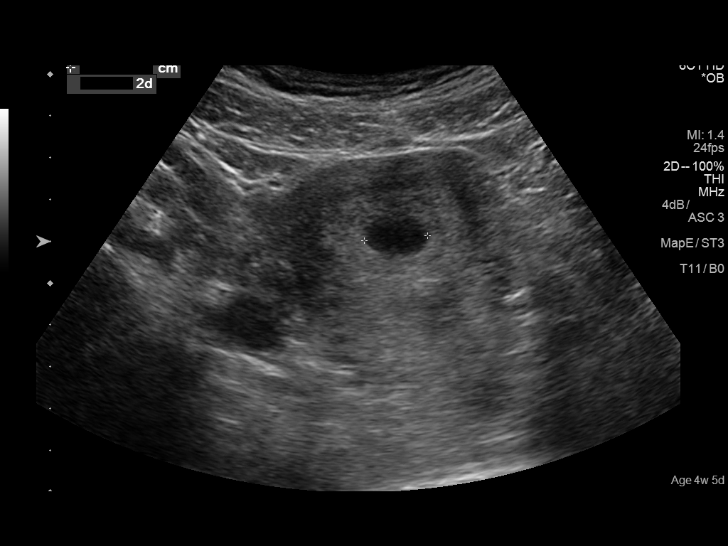
[im 10/87]
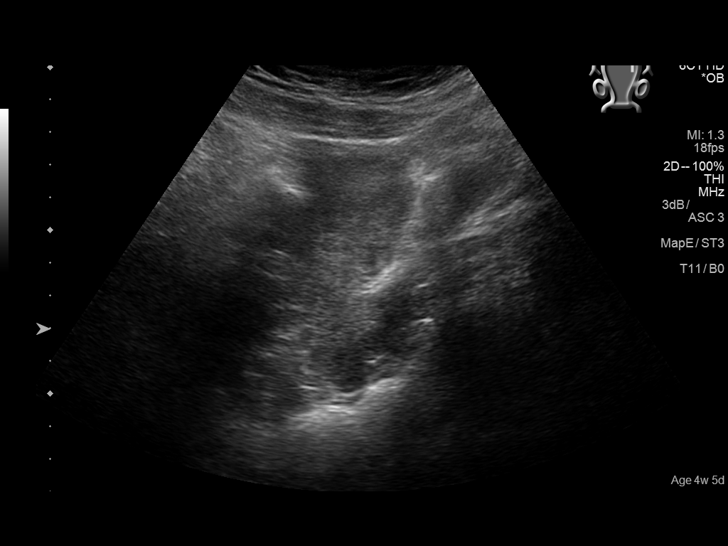
[im 16/87]
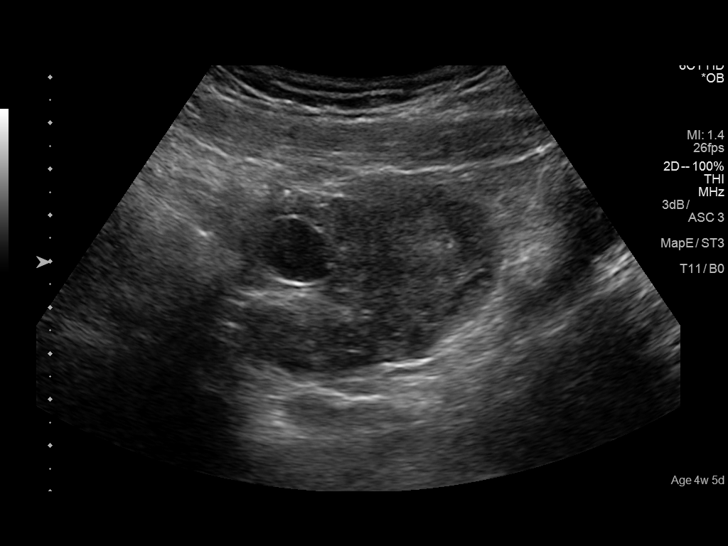
[im 23/87]
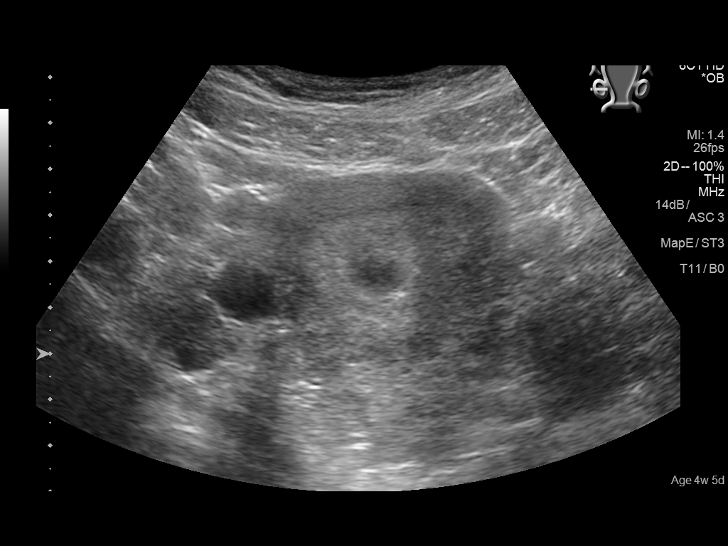
[im 29/87]
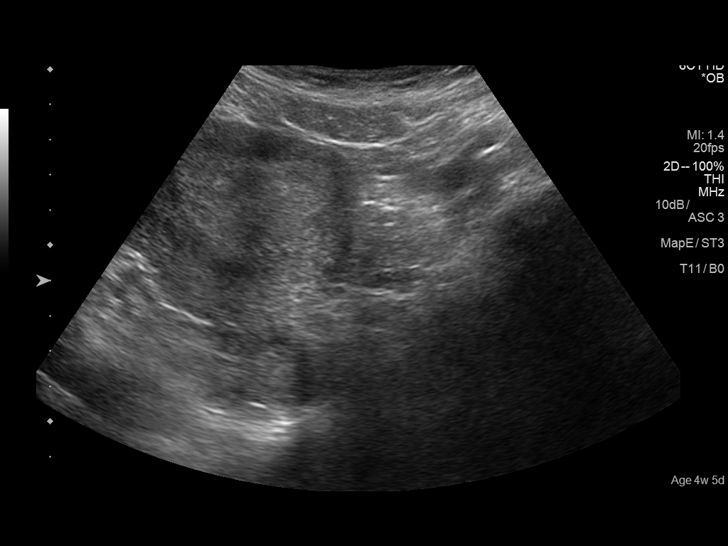
[im 36/87]
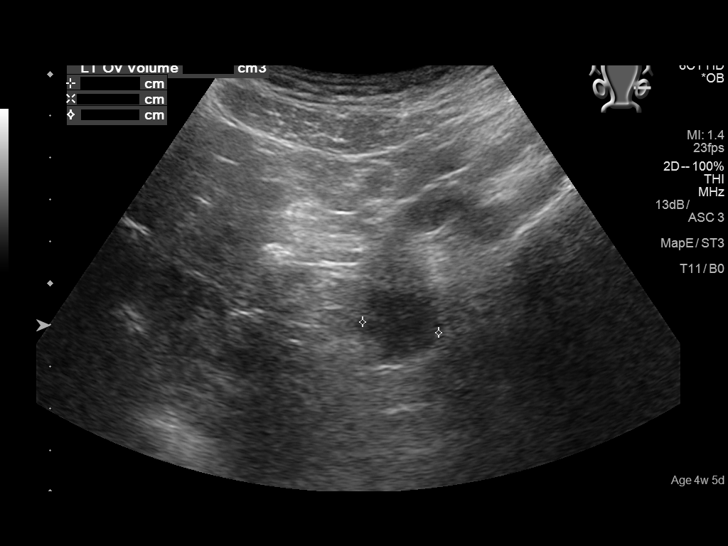
[im 45/87]
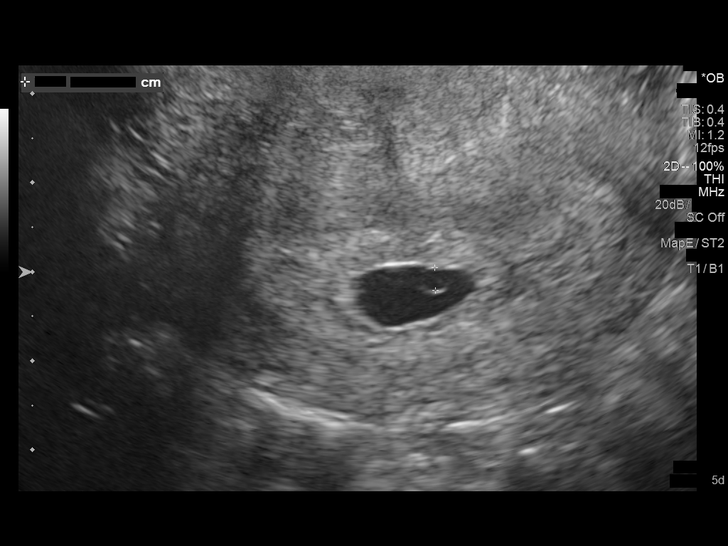
[im 51/87]
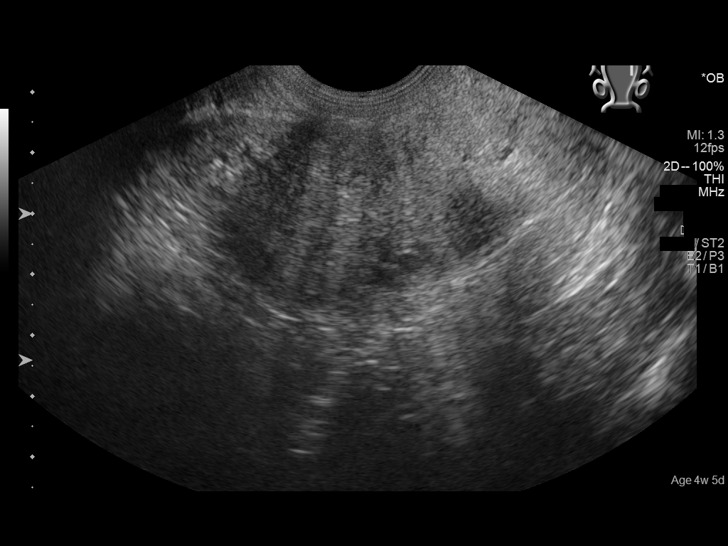
[im 58/87]
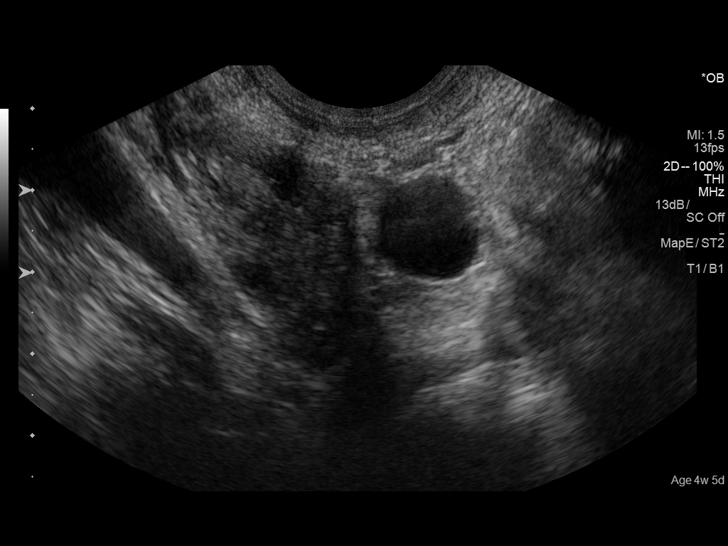
[im 64/87]
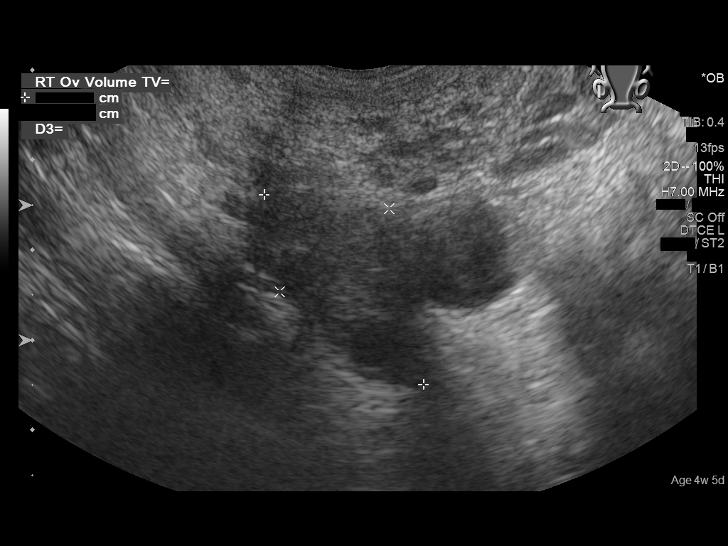
[im 71/87]
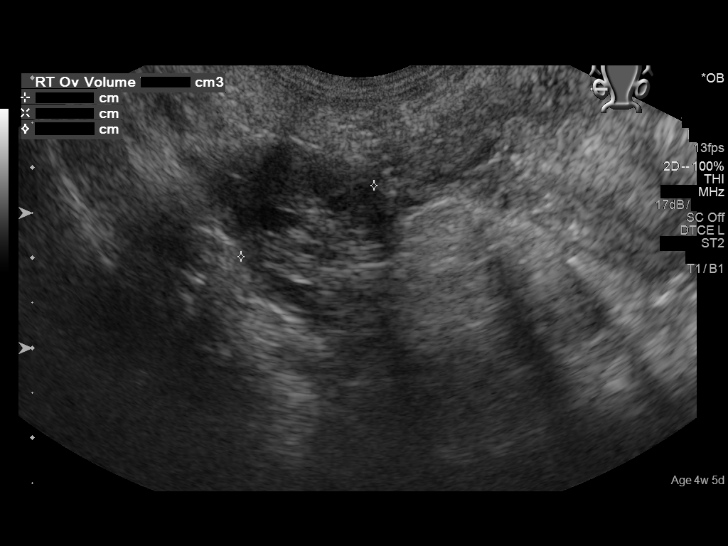
[im 77/87]
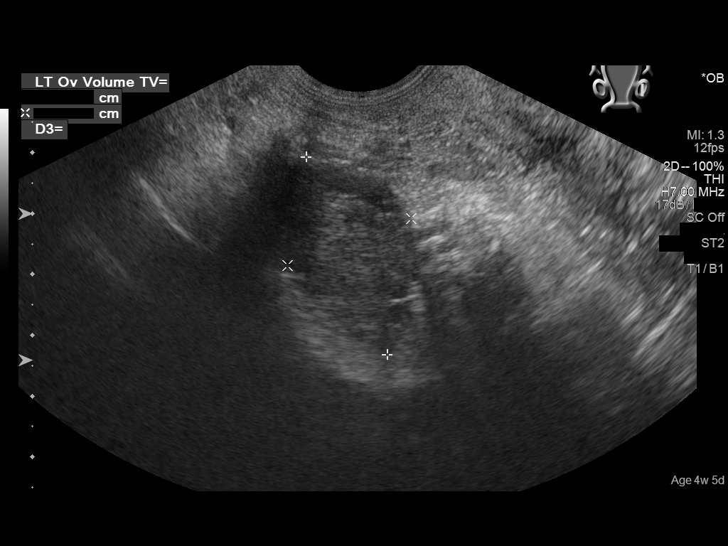
[im 83/87]
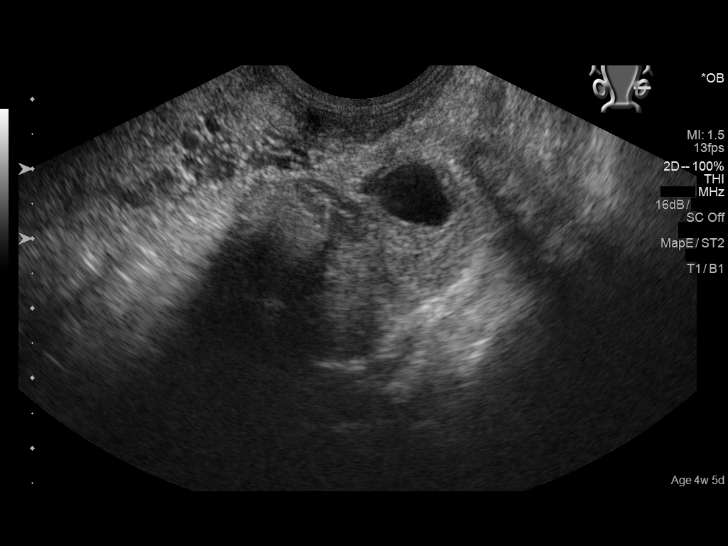

[13 of 28 positions shown; findings below may reference images not displayed]

FINDINGS: Intrauterine gestational sac: Single; visualized and normal in
shape.

Yolk sac:  Yes

Embryo:  No

Cardiac Activity: N/A

MSD: 1.1 cm   5 w   6  d

Subchorionic hemorrhage:  None visualized.

Maternal uterus/adnexae: The uterus is otherwise unremarkable in
appearance.

The ovaries are within normal limits. The right ovary measures 2.8 x
1.5 x 1.7 cm, while the left ovary measures 3.5 x 2.2 x 1.9 cm. No
suspicious adnexal masses are seen; there is no evidence for ovarian
torsion.

No free fluid is seen within the pelvic cul-de-sac.
IMPRESSION: Single intrauterine gestational sac noted, with a mean sac diameter
of 1.1 cm. A yolk sac is seen. No embryo is yet visualized. This
reflects a gestational age of 5 weeks 6 days, which does not match
the gestational age by LMP, though it remains too early to determine
a new estimated date of delivery.

If the patient's quantitative beta HCG level continues to rise,
would perform follow-up pelvic ultrasound in 2 weeks to confirm the
gestational age and estimated date of delivery.

## 2018-05-05 ENCOUNTER — Ambulatory Visit: Payer: Medicaid Other | Admitting: Maternal Newborn

## 2018-06-27 ENCOUNTER — Ambulatory Visit (INDEPENDENT_AMBULATORY_CARE_PROVIDER_SITE_OTHER): Payer: Medicaid Other

## 2018-06-27 ENCOUNTER — Other Ambulatory Visit: Payer: Self-pay | Admitting: Obstetrics and Gynecology

## 2018-06-27 ENCOUNTER — Ambulatory Visit (INDEPENDENT_AMBULATORY_CARE_PROVIDER_SITE_OTHER): Payer: Medicaid Other | Admitting: Obstetrics and Gynecology

## 2018-06-27 ENCOUNTER — Encounter: Payer: Self-pay | Admitting: Obstetrics and Gynecology

## 2018-06-27 VITALS — BP 118/74 | Wt 190.0 lb

## 2018-06-27 DIAGNOSIS — Z3A01 Less than 8 weeks gestation of pregnancy: Secondary | ICD-10-CM | POA: Diagnosis not present

## 2018-06-27 DIAGNOSIS — O3680X0 Pregnancy with inconclusive fetal viability, not applicable or unspecified: Secondary | ICD-10-CM | POA: Diagnosis not present

## 2018-06-27 DIAGNOSIS — O2341 Unspecified infection of urinary tract in pregnancy, first trimester: Secondary | ICD-10-CM | POA: Diagnosis not present

## 2018-06-27 DIAGNOSIS — O09211 Supervision of pregnancy with history of pre-term labor, first trimester: Secondary | ICD-10-CM | POA: Diagnosis not present

## 2018-06-27 DIAGNOSIS — O0990 Supervision of high risk pregnancy, unspecified, unspecified trimester: Secondary | ICD-10-CM | POA: Insufficient documentation

## 2018-06-27 DIAGNOSIS — O2340 Unspecified infection of urinary tract in pregnancy, unspecified trimester: Secondary | ICD-10-CM | POA: Insufficient documentation

## 2018-06-27 DIAGNOSIS — O09899 Supervision of other high risk pregnancies, unspecified trimester: Secondary | ICD-10-CM

## 2018-06-27 DIAGNOSIS — O09219 Supervision of pregnancy with history of pre-term labor, unspecified trimester: Secondary | ICD-10-CM

## 2018-06-27 NOTE — Progress Notes (Signed)
New Obstetric Patient H&P   Chief Complaint: "Desires prenatal care"  History of Present Illness: Patient is a 22 y.o. [id number] Not Hispanic or Latino female, fairly sure LMP 05/03/2018 presents with amenorrhea and positive home pregnancy test. Based on her  LMP, her EDD is Estimated Date of Delivery: 02/07/19 and her EGA is 6726w6d. Cycles are 3. days, regular, and occur approximately every : 28 days. Her last pap smear was 1 year ago and was no abnormalities.    She had a urine pregnancy test which was positive 2 week(s)  ago. Her last menstrual period was normal and lasted for  3 day(s). Since her LMP she claims she has experienced lower back issues. She denies vaginal bleeding. Her past medical history is notable for recurrent UTI. Her prior pregnancies are notable for preterm labor and delivery at 35 weeks with G1.  G2 delivered at 38 weeks and she was on 17OHP.  Since her LMP, she admits to the use of tobacco products  yes She claims she has gained zero pounds since the start of her pregnancy.  There are cats in the home in the home  no  She admits close contact with children on a regular basis  yes  She has had chicken pox in the past yes She has had Tuberculosis exposures, symptoms, or previously tested positive for TB   no Current or past history of domestic violence. no  Genetic Screening/Teratology Counseling: (Includes patient, baby's father, or anyone in either family with:)   1. [facility]  no 2. Thalassemia ([place], [place], Mediterranean, or Asian background): MCV<80  no 3. Neural tube defect (meningomyelocele, spina bifida, anencephaly)  no 4. Congenital heart defect  no  5. Down syndrome  no 6. Tay-Sachs (Jewish, [place])  no 7. Canavan's Disease  no 8. Sickle cell disease or trait (African)  no  9. Hemophilia or other blood disorders  no  10. Muscular dystrophy  no  11. Cystic fibrosis  no  12. Huntington's Chorea  no  13. Mental  retardation/autism  no 14. Other inherited genetic or chromosomal disorder  no 15. Maternal metabolic disorder (DM, PKU, etc)  no 16. Patient or FOB with a child with a birth defect not listed above no  16a. Patient or FOB with a birth defect themselves no 17. Recurrent pregnancy loss, or stillbirth  no  18. Any medications since LMP other than prenatal vitamins (include vitamins, supplements, OTC meds, drugs, alcohol)  no 19. Any other genetic/environmental exposure to discuss  no  Infection History:   1. Lives with someone with TB or TB exposed  no  2. Patient or partner has history of genital herpes  no 3. Rash or viral illness since LMP  no 4. History of STI (GC, CT, HPV, syphilis, HIV)  Trichomonas 5. History of recent travel :  no  Other pertinent information:  no   Review of Systems:10 point review of systems negative unless otherwise noted in HPI  Past Medical History:  Diagnosis Date  . Anxiety   . Chronic kidney disease    KIDNEY REFLUX  . Depression   . GERD (gastroesophageal reflux disease)    WITH PREGNANCY ONLY  . Headache    MIGRAINES  . History of methicillin resistant staphylococcus aureus (MRSA) 2008  . Pinched nerve   . Pyelonephritis   . Recurrent UTI     Past Surgical History:  Procedure Laterality Date  . CHOLECYSTECTOMY N/A 03/07/2018   Procedure:  LAPAROSCOPIC CHOLECYSTECTOMY;  Surgeon: Sung Amabile, DO;  Location: [facility] ORS;  Service: General;  Laterality: N/A;  . KIDNEY SURGERY    . KIDNEY SURGERY      Gynecologic History: Patient's last menstrual period was 05/03/2018.  Obstetric History: [id number]  Family History  Problem Relation Age of Onset  . Diabetes Maternal Aunt   . Hypertension Neg Hx   . Ovarian cancer Neg Hx   . Breast cancer Neg Hx   . Heart disease Neg Hx     Social History   Socioeconomic History  . Marital status: Legally Separated    Spouse name: Not on file  . Number of children: Not on file  . Years of education:  Not on file  . Highest education level: Not on file  Occupational History  . Not on file  Social Needs  . Financial resource strain: Not on file  . Food insecurity:    Worry: Not on file    Inability: Not on file  . Transportation needs:    Medical: Not on file    Non-medical: Not on file  Tobacco Use  . Smoking status: Current Every Day Smoker    Packs/day: 0.50    Years: 8.00    Pack years: 4.00    Types: Cigarettes  . Smokeless tobacco: Never Used  Substance and Sexual Activity  . Alcohol use: No  . Drug use: No  . Sexual activity: Yes    Birth control/protection: None  Lifestyle  . Physical activity:    Days per week: Not on file    Minutes per session: Not on file  . Stress: Not on file  Relationships  . Social connections:    Talks on phone: Not on file    Gets together: Not on file    Attends religious service: Not on file    Active member of club or organization: Not on file    Attends meetings of clubs or organizations: Not on file    Relationship status: Not on file  . Intimate partner violence:    Fear of current or ex partner: Not on file    Emotionally abused: Not on file    Physically abused: Not on file    Forced sexual activity: Not on file  Other Topics Concern  . Not on file  Social History Narrative  . Not on file    Allergies  Allergen Reactions  . Amoxicillin Anaphylaxis  . Vancomycin Itching    Patient with obvious "redmans" syndrome  . Penicillins Swelling        Prior to Admission medications   Medication Sig Start Date End Date Taking? Authorizing Provider  gabapentin (NEURONTIN) 400 MG capsule Take 400 mg by mouth daily as needed.  10/18/17  Yes [provider]  Prenatal Vit-Fe Fumarate-FA (PRENATAL VITAMIN PO) Take by mouth.   Yes [provider]  AMITIZA 8 MCG capsule Take 8 mcg by mouth 2 (two) times daily as needed.  01/07/18   [provider]  cyclobenzaprine (FLEXERIL) 10 MG tablet Take 10 mg by  mouth 3 (three) times daily as needed for muscle spasms. 01/07/18   [provider]   Physical Exam BP 118/74   Wt 190 lb (86.2 kg)   LMP 05/03/2018   BMI 30.67 kg/m   Physical Exam Exam conducted with a chaperone present.  Constitutional:      General: She is not in acute distress.    Appearance: Normal appearance.  HENT:  Head: Normocephalic and atraumatic.  Eyes:     General: No scleral icterus.    Conjunctiva/sclera: Conjunctivae normal.  Neck:     Musculoskeletal: Normal range of motion and neck supple.  Cardiovascular:     Rate and Rhythm: Normal rate and regular rhythm.     Heart sounds: No murmur. No friction rub. No gallop.   Pulmonary:     Effort: Pulmonary effort is normal. No respiratory distress.     Breath sounds: Normal breath sounds. No wheezing, rhonchi or rales.  Abdominal:     General: Bowel sounds are normal. There is no distension.     Palpations: Abdomen is soft. There is no mass.     Tenderness: There is no abdominal tenderness. There is no right CVA tenderness, left CVA tenderness, guarding or rebound.     Hernia: No hernia is present.  Genitourinary:    General: Normal vulva.     Exam position: Supine.     Labia:        Right: No rash, tenderness or lesion.        Left: No rash, tenderness or lesion.      Vagina: Normal.     Cervix: Normal.     Uterus: Normal.      Adnexa: Right adnexa normal and left adnexa normal.  Musculoskeletal: Normal range of motion.        General: No swelling.  Lymphadenopathy:     Cervical: No cervical adenopathy.  Skin:    General: Skin is warm and dry.     Findings: No erythema.  Neurological:     General: No focal deficit present.     Mental Status: She is alert and oriented to person, place, and time.     Cranial Nerves: No cranial nerve deficit.  Psychiatric:        Mood and Affect: Mood normal.        Behavior: Behavior normal.        Thought Content: Thought content normal.    Female  Chaperone present during breast and/or pelvic exam.  Imaging Report: US Ob Transvaginal  Result Date: 06/27/2018 Patient Name: Molly Bray DOB: 04/20/1996 MRN: [id number] ULTRASOUND REPORT Location: [facility] Date of Service: 06/27/2018 Indications:dating Findings: Mason Jim intrauterine pregnancy is visualized with a CRL consistent with [redacted]w[redacted]d gestation, giving an (U/S) EDD of 02/22/2019. The (U/S) EDD is not consistent with the clinically established EDD of 02/07/2019. FHR: 85 BPM- Questionable low due to small/early GA. CRL measurement: 2.2 mm Yolk sac is visualized and appears normal. Amnion: not visualized Right Ovary is normal in appearance. Left Ovary is not seen. Survey of the adnexa demonstrates no adnexal masses. There is no free peritoneal fluid in the cul de sac. Impression: 1. [redacted]w[redacted]d Viable Singleton Intrauterine pregnancy by U/S. 2. (U/S) EDD is not consistent with Clinically established EDD. [place] EDD of 02/22/2019. Recommendations: 1.Clinical correlation with the patient's History and Physical Exam. 2. Follow up FHR. Darlina Guys, RDMS RVT There is a viable singleton gestation.  Detailed evaluation of the fetal anatomy is precluded by early gestational age.  It must be noted that a normal ultrasound particular at this early gestational age is unable to rule out fetal aneuploidy, risk of first trimester miscarriage, or anatomic birth defects. [name], MD, [facility], [facility] 06/27/2018 11:40 AM     Assessment: 22 y.o. [id number] at [redacted]w[redacted]d (by ultrasound today) presenting to initiate prenatal care  Plan: 1) Avoid alcoholic beverages. 2) Patient  encouraged not to smoke.  3) Discontinue the use of all non-medicinal drugs and chemicals.  4) Take prenatal vitamins daily.  5) Nutrition, food safety (fish, cheese advisories, and high nitrite foods) and exercise discussed. 6) Hospital and practice style discussed with cross coverage system.  7) Genetic  Screening, such as with 1st Trimester Screening, cell free fetal DNA, AFP testing, and Ultrasound, as well as with amniocentesis and CVS as appropriate, is discussed with patient. At the conclusion of today's visit patient undecided genetic testing 8) Patient is asked about travel to areas at risk for the [place] virus, and counseled to avoid travel and exposure to mosquitoes or sexual partners who may have themselves been exposed to the virus. Testing is discussed, and will be ordered as appropriate.  9) back pain: Discussed that gabapentin use in pregnancy is not well studied.  She has chronic low back pain for which she takes gabapentin.  I recommend we attempt a different medication.  However there are sparse studies indicating risk versus no risk.  Given the scarcity of information on gabapentin, I recommended she discontinue the medication and work with her provider treating her back pain to utilize medication known to be safe in pregnancy. 10) history of preterm delivery: Recommend 17 hydroxyprogesterone starting at about 16 weeks.  The patient is agreeable to this plan. 11) her due date is adjusted today based on ultrasound obtained in clinic.  [name], MD 06/27/2018 9:39 AM

## 2018-06-27 NOTE — Patient Instructions (Signed)
For nausea (these may be purchased over-the-counter): -Vitamin B6 (pyridoxine):  25 mg three times each day (may buy 100 mg tablet and take twice per day or try to cut into 4 equal pieces and take 1 piece three times each day).  - doxylamine (found in [employer] and other sleep agents that can be bought in the store): take 25 - 50 mg at bedtime.  May take up to 25 mg three time each day.  However, keep in mind that this might make you sleepy.  

## 2018-06-29 ENCOUNTER — Encounter: Payer: Self-pay | Admitting: Obstetrics and Gynecology

## 2018-07-03 LAB — URINE DRUG PANEL 7
Amphetamines, Urine: NEGATIVE ng/mL
Barbiturate Quant, Ur: NEGATIVE ng/mL
Benzodiazepine Quant, Ur: NEGATIVE ng/mL
Cannabinoid Quant, Ur: NEGATIVE ng/mL
Cocaine (Metab.): NEGATIVE ng/mL
Opiate Quant, Ur: NEGATIVE ng/mL
PCP Quant, Ur: NEGATIVE ng/mL

## 2018-07-03 LAB — URINE CULTURE

## 2018-07-03 LAB — CHLAMYDIA/GONOCOCCUS/TRICHOMONAS, NAA
Chlamydia by NAA: NEGATIVE
Gonococcus by NAA: NEGATIVE
TRICH VAG BY NAA: NEGATIVE

## 2018-07-07 ENCOUNTER — Encounter: Payer: Medicaid Other | Admitting: Certified Nurse Midwife

## 2018-07-07 ENCOUNTER — Ambulatory Visit: Payer: Medicaid Other

## 2018-07-09 ENCOUNTER — Other Ambulatory Visit: Payer: Self-pay

## 2018-07-09 DIAGNOSIS — Z5321 Procedure and treatment not carried out due to patient leaving prior to being seen by health care provider: Secondary | ICD-10-CM | POA: Insufficient documentation

## 2018-07-09 DIAGNOSIS — Z3A01 Less than 8 weeks gestation of pregnancy: Secondary | ICD-10-CM | POA: Diagnosis not present

## 2018-07-09 DIAGNOSIS — O208 Other hemorrhage in early pregnancy: Secondary | ICD-10-CM | POA: Diagnosis present

## 2018-07-09 LAB — ABO/RH: ABO/RH(D): O POS

## 2018-07-09 LAB — POCT PREGNANCY, URINE: PREG TEST UR: POSITIVE — AB

## 2018-07-09 NOTE — ED Triage Notes (Signed)
Pt arrives to ED via POV from home with c/o vaginal bleeding x1 hr "with blood clots and abdominal cramping". Pt is 7w 5d pregnant. Pt also reports having a splinter in the left hip that happened yesterday; unsure if tetanus shot is UTD. Pt denies CP, no SHOB. Pt is A&O, in NAD; RR even, regular, and unlabored.

## 2018-07-09 NOTE — ED Notes (Signed)
Pt to ED reporting vaginal bleeding with clots for the last hour. Pt is 7 weeks 4 days pregnant with no complications reported but pt verbalized being "high risk" Pt is ambulatory to lobby desk. Color is appropriate. NAD noted at this time.

## 2018-07-10 ENCOUNTER — Emergency Department
Admission: EM | Admit: 2018-07-10 | Discharge: 2018-07-10 | Disposition: A | Discharge status: Home / Self Care | Payer: Medicaid Other | Attending: Emergency Medicine | Admitting: Emergency Medicine

## 2018-07-10 ENCOUNTER — Ambulatory Visit (INDEPENDENT_AMBULATORY_CARE_PROVIDER_SITE_OTHER): Payer: Medicaid Other

## 2018-07-10 ENCOUNTER — Other Ambulatory Visit: Payer: Self-pay | Admitting: Obstetrics & Gynecology

## 2018-07-10 ENCOUNTER — Ambulatory Visit (INDEPENDENT_AMBULATORY_CARE_PROVIDER_SITE_OTHER): Payer: Medicaid Other | Admitting: Obstetrics & Gynecology

## 2018-07-10 VITALS — BP 110/60 | Wt 191.0 lb

## 2018-07-10 DIAGNOSIS — Z3A01 Less than 8 weeks gestation of pregnancy: Secondary | ICD-10-CM

## 2018-07-10 DIAGNOSIS — O209 Hemorrhage in early pregnancy, unspecified: Secondary | ICD-10-CM

## 2018-07-10 LAB — POCT URINALYSIS DIPSTICK OB
GLUCOSE, UA: NEGATIVE
POC,PROTEIN,UA: NEGATIVE

## 2018-07-10 NOTE — Progress Notes (Addendum)
Obstetric Problem Visit   Chief Complaint:  Chief Complaint  Patient presents with  . Routine Prenatal Visit   History of Present Illness: Patient is a 22 y.o. [id number] 3645w4d presenting for first trimester bleeding.  The onset of bleeding was last night, light to moderate, intermittent, assoc w pain, no nausea.  Started bleeding last night 9pm, only saw it on urine, pt is spotting and passing clots, cramping since yesterday all day .  Is bleeding equal to or greater than normal menstrual flow:  No Any recent trauma:  No Recent intercourse:  No History of prior miscarriage:  No Prior ultrasound demonstrating IUP:  Yes Prior ultrasound demonstrating viable IUP:  Yes Prior Serum HCG:  No Rh status: O+  PMHx: She  has a past medical history of Anxiety, Chronic kidney disease, Depression, GERD (gastroesophageal reflux disease), Headache, History of methicillin resistant staphylococcus aureus (MRSA) (2008), Pinched nerve, Pyelonephritis, and Recurrent UTI. Also,  has a past surgical history that includes Kidney surgery; Kidney surgery; and Cholecystectomy (N/A, 03/07/2018)., family history includes Diabetes in her maternal aunt.,  reports that she has been smoking cigarettes. She has a 4.00 pack-year smoking history. She has never used smokeless tobacco. She reports that she does not drink alcohol or use drugs.  She has a current medication list which includes the following prescription(s): cyclobenzaprine, gabapentin, and prenatal vit-fe fumarate-fa. Also, is allergic to amoxicillin; vancomycin; and penicillins.  Review of Systems  Constitutional: Negative for chills, fever and malaise/fatigue.  HENT: Negative for congestion, sinus pain and sore throat.   Eyes: Negative for blurred vision and pain.  Respiratory: Negative for cough and wheezing.   Cardiovascular: Negative for chest pain and leg swelling.  Gastrointestinal: Negative for abdominal pain, constipation, diarrhea, heartburn, nausea  and vomiting.  Genitourinary: Negative for dysuria, frequency, hematuria and urgency.  Musculoskeletal: Negative for back pain, joint pain, myalgias and neck pain.  Skin: Negative for itching and rash.  Neurological: Negative for dizziness, tremors and weakness.  Endo/Heme/Allergies: Does not bruise/bleed easily.  Psychiatric/Behavioral: Negative for depression. The patient is not nervous/anxious and does not have insomnia.     Objective: Vitals:   07/10/18 1325  BP: 110/60   Physical Exam Constitutional:      General: She is not in acute distress.    Appearance: She is well-developed.  Genitourinary:     Pelvic exam was performed with patient supine.     Vagina and uterus normal.     No vaginal erythema or bleeding.     No cervical bleeding, polyp or nabothian cyst.     Uterus is mobile.     Uterus is not enlarged.     No uterine mass detected.    Uterus is midaxial.     No right or left adnexal mass present.     Right adnexa not tender.     Left adnexa not tender.     Genitourinary Comments: Cervix closed, min bleeding on spec exam, thick effacement as well No adnexal mass or T  HENT:     Head: Normocephalic and atraumatic.     Nose: Nose normal.  Cardiovascular:     Rate and Rhythm: Normal rate.     Pulses: Normal pulses.     Heart sounds: Normal heart sounds.  Pulmonary:     Effort: Pulmonary effort is normal.     Breath sounds: Normal breath sounds.  Abdominal:     General: There is no distension.     Palpations: Abdomen is soft.  Tenderness: There is no abdominal tenderness.  Musculoskeletal: Normal range of motion.  Neurological:     Mental Status: She is alert and oriented to person, place, and time.     Cranial Nerves: No cranial nerve deficit.  Skin:    General: Skin is warm and dry.     Assessment: 22 y.o. W2N5621G4P1112 5413w4d  [redacted] weeks gestation of pregnancy    -  Primary   Relevant Orders   POC Urinalysis Dipstick OB (Completed)   First trimester  bleeding         1) First trimester bleeding - incidence and clinical course of first trimester bleeding is discussed in detail with the patient today.  Approximately 1/3 of pregnancies ending in live births experienced 1st trimester bleeding.  The amount of bleeding is variable and not necessarily predictive of outcome.  Sources may be cervical or uterine.  Subchorionic hemorrhages are a frequent concurrent findings on ultrasound and are followed expectantly.  These often absorb or regress spontaneously although risk for expansion and further disruption of the utero-placental interface leading to miscarriage is possible.  There is no clearly documented benefit to limiting or modifying activity and sexual intercourse in altering clinic course of 1st trimester bleeding.    2) If not already done will proceed with TVUS evaluation to document viability, and if uncertain viability or absence of a demonstrable IUP (and no previous documentation of IUP) will trend HCG levels.  3) The patient is Rh O+, rhogam is therefore not indicated to decrease the risk rhesus alloimmunization.    4) Routine bleeding precautions were discussed with the patient prior the conclusion of today's visit.  Annamarie MajorPaul Harris, MD, [facility] FrederickFACOG [facility], [facility] 07/10/2018  2:33 PM   ADDENDUM: Review of ULTRASOUND.    I have personally reviewed images and [facility]    Plan of management to be discussed with patient.    Small SCH, otherwise viable IUP.  Good FHR.    Follow up based on sx's.  US scheduled next Friday for follow up.  Annamarie MajorPaul Harris, MD, [facility]inda FrederickFACOG [facility], [facility] 07/10/2018  4:58 PM

## 2018-07-16 NOTE — L&D Delivery Note (Signed)
Delivery Note Primary OB: [facility] Delivery Physician: Barnett Applebaum, MD Gestational Age: Full term Antepartum complications: none Intrapartum complications: None  A viable Female was delivered via vertex presentation. "Blayden" Apgars:8 ,9  Weight:  pending .   Placenta status: spontaneous and Intact.  Cord: 3+ vessels;  with the following complications: none.  Anesthesia:  epidural Episiotomy:  none Lacerations:  2nd Suture Repair: 2.0 vicryl Est. Blood Loss (mL):  500 mL  Mom to postpartum.  Baby to Couplet care / Skin to Skin.  Barnett Applebaum, MD, [facility], [facility] 07/14/2019  1:09 PM [phone]

## 2018-07-18 ENCOUNTER — Ambulatory Visit: Payer: Medicaid Other

## 2018-07-18 ENCOUNTER — Encounter: Payer: Medicaid Other | Admitting: Certified Nurse Midwife

## 2018-07-23 ENCOUNTER — Encounter: Payer: Self-pay | Admitting: Certified Nurse Midwife

## 2018-07-23 DIAGNOSIS — T300 Burn of unspecified body region, unspecified degree: Secondary | ICD-10-CM | POA: Insufficient documentation

## 2018-07-24 ENCOUNTER — Encounter: Payer: Medicaid Other | Admitting: Certified Nurse Midwife

## 2018-07-25 ENCOUNTER — Encounter: Payer: Self-pay | Admitting: Obstetrics & Gynecology

## 2018-07-25 ENCOUNTER — Ambulatory Visit: Payer: Medicaid Other | Admitting: Obstetrics & Gynecology

## 2018-07-25 ENCOUNTER — Ambulatory Visit (INDEPENDENT_AMBULATORY_CARE_PROVIDER_SITE_OTHER): Payer: Medicaid Other | Admitting: Obstetrics & Gynecology

## 2018-07-25 VITALS — BP 110/70 | Wt 188.0 lb

## 2018-07-25 DIAGNOSIS — O209 Hemorrhage in early pregnancy, unspecified: Secondary | ICD-10-CM

## 2018-07-25 DIAGNOSIS — O039 Complete or unspecified spontaneous abortion without complication: Secondary | ICD-10-CM | POA: Diagnosis not present

## 2018-07-25 NOTE — Progress Notes (Signed)
  History of Present Illness:  Molly Bray is a 23 y.o. who recently had bleeding and noted to have miscarriage.  Seen 12/26 here w normal [place] and CRL w FHT then, she had [facility] as well; she was bleeding some then but next day 12/27 heavier bleeding and [place] at [facility] ER then was no IUP.  Beta then 3800. Less bleeding now, no pain. Desires future pregnancy.  PMHx: She  has a past medical history of Anxiety, Chronic kidney disease, Depression, GERD (gastroesophageal reflux disease), Headache, History of methicillin resistant staphylococcus aureus (MRSA) (2008), Pinched nerve, Pyelonephritis, and Recurrent UTI. Also,  has a past surgical history that includes Kidney surgery; Kidney surgery; and Cholecystectomy (N/A, 03/07/2018)., family history includes Diabetes in her maternal aunt.,  reports that she has been smoking cigarettes. She has a 4.00 pack-year smoking history. She has never used smokeless tobacco. She reports that she does not drink alcohol or use drugs. No outpatient medications have been marked as taking for the 07/25/18 encounter (Office Visit) with Nadara Mustard, MD.  . Also, is allergic to amoxicillin; vancomycin; and penicillins..  Review of Systems  Constitutional: Negative for chills, fever and malaise/fatigue.  HENT: Negative for congestion, sinus pain and sore throat.   Eyes: Negative for blurred vision and pain.  Respiratory: Negative for cough and wheezing.   Cardiovascular: Negative for chest pain and leg swelling.  Gastrointestinal: Negative for abdominal pain, constipation, diarrhea, heartburn, nausea and vomiting.  Genitourinary: Negative for dysuria, frequency, hematuria and urgency.  Musculoskeletal: Negative for back pain, joint pain, myalgias and neck pain.  Skin: Negative for itching and rash.  Neurological: Negative for dizziness, tremors and weakness.  Endo/Heme/Allergies: Does not bruise/bleed easily.  Psychiatric/Behavioral: Negative for depression. The  patient is not nervous/anxious and does not have insomnia.     Physical Exam:  BP 110/70   Wt 188 lb (85.3 kg)   LMP 05/03/2018   BMI 33.30 kg/m  Body mass index is 33.3 kg/m. Constitutional: Well nourished, well developed female in no acute distress.  Abdomen: diffusely non tender to palpation, non distended, and no masses, hernias Neuro: Grossly intact Psych:  Normal mood and affect.    Assessment:   First trimester bleeding    -  Primary   Relevant Orders   Beta hCG quant (ref lab)   Complete abortion         Plan: Detailed discussion of results today. Options for management discussed. Info provided. At this time, we will plan for repeat beta to confirm drop. She was amenable to this plan and we will see her back for annual/PRN.  She will cont PNV and plan fertility after next cycle.  A total of 15 minutes were spent face-to-face with the patient during this encounter and over half of that time dealt with counseling and coordination of care.  Annamarie Major, MD, [facility], [facility] 07/25/2018  11:30 AM

## 2018-07-26 LAB — BETA HCG QUANT (REF LAB): hCG Quant: 17 m[IU]/mL

## 2018-08-08 ENCOUNTER — Ambulatory Visit: Payer: Self-pay | Admitting: Adult Health

## 2018-08-19 IMAGING — CR DG HAND COMPLETE 3+V*L*
1 series · 3 of 3 positions shown · non-contrast
Comparison: None.

CLINICAL DATA: Soft tissue swelling and redness

EXAM:
LEFT HAND - COMPLETE 3+ VIEW

[Series 1: dg hand complete left · 0.14mm/px · 3 of 3 slices shown]
[im 1/3]
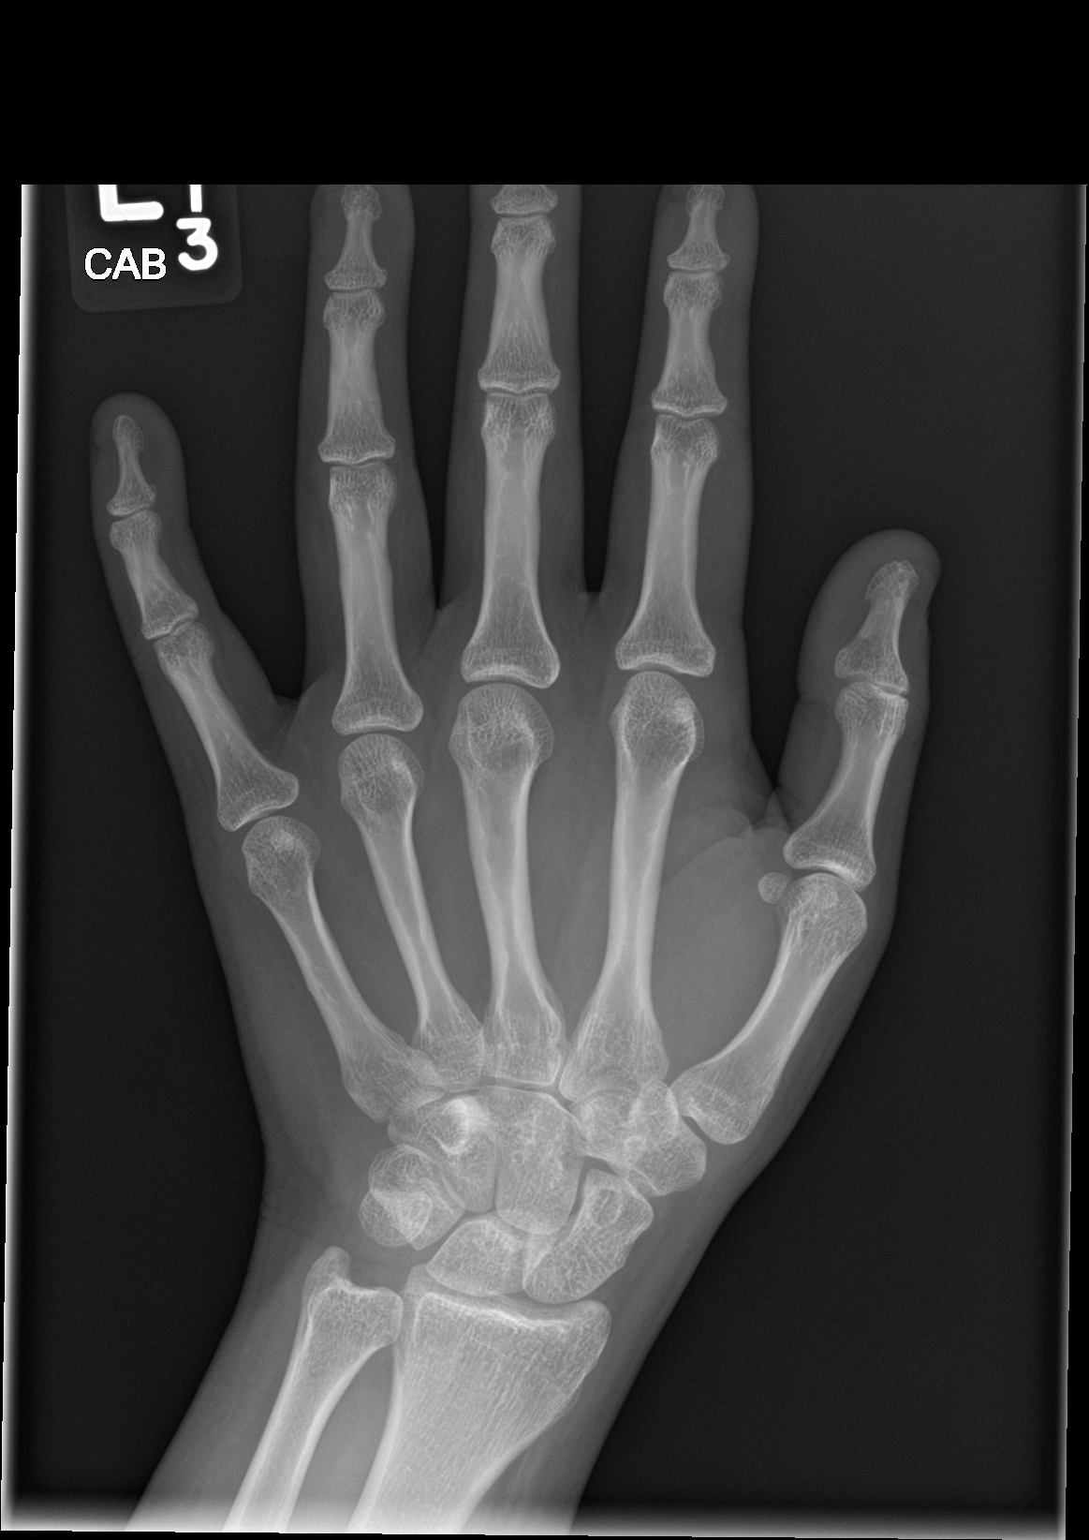
[im 2/3]
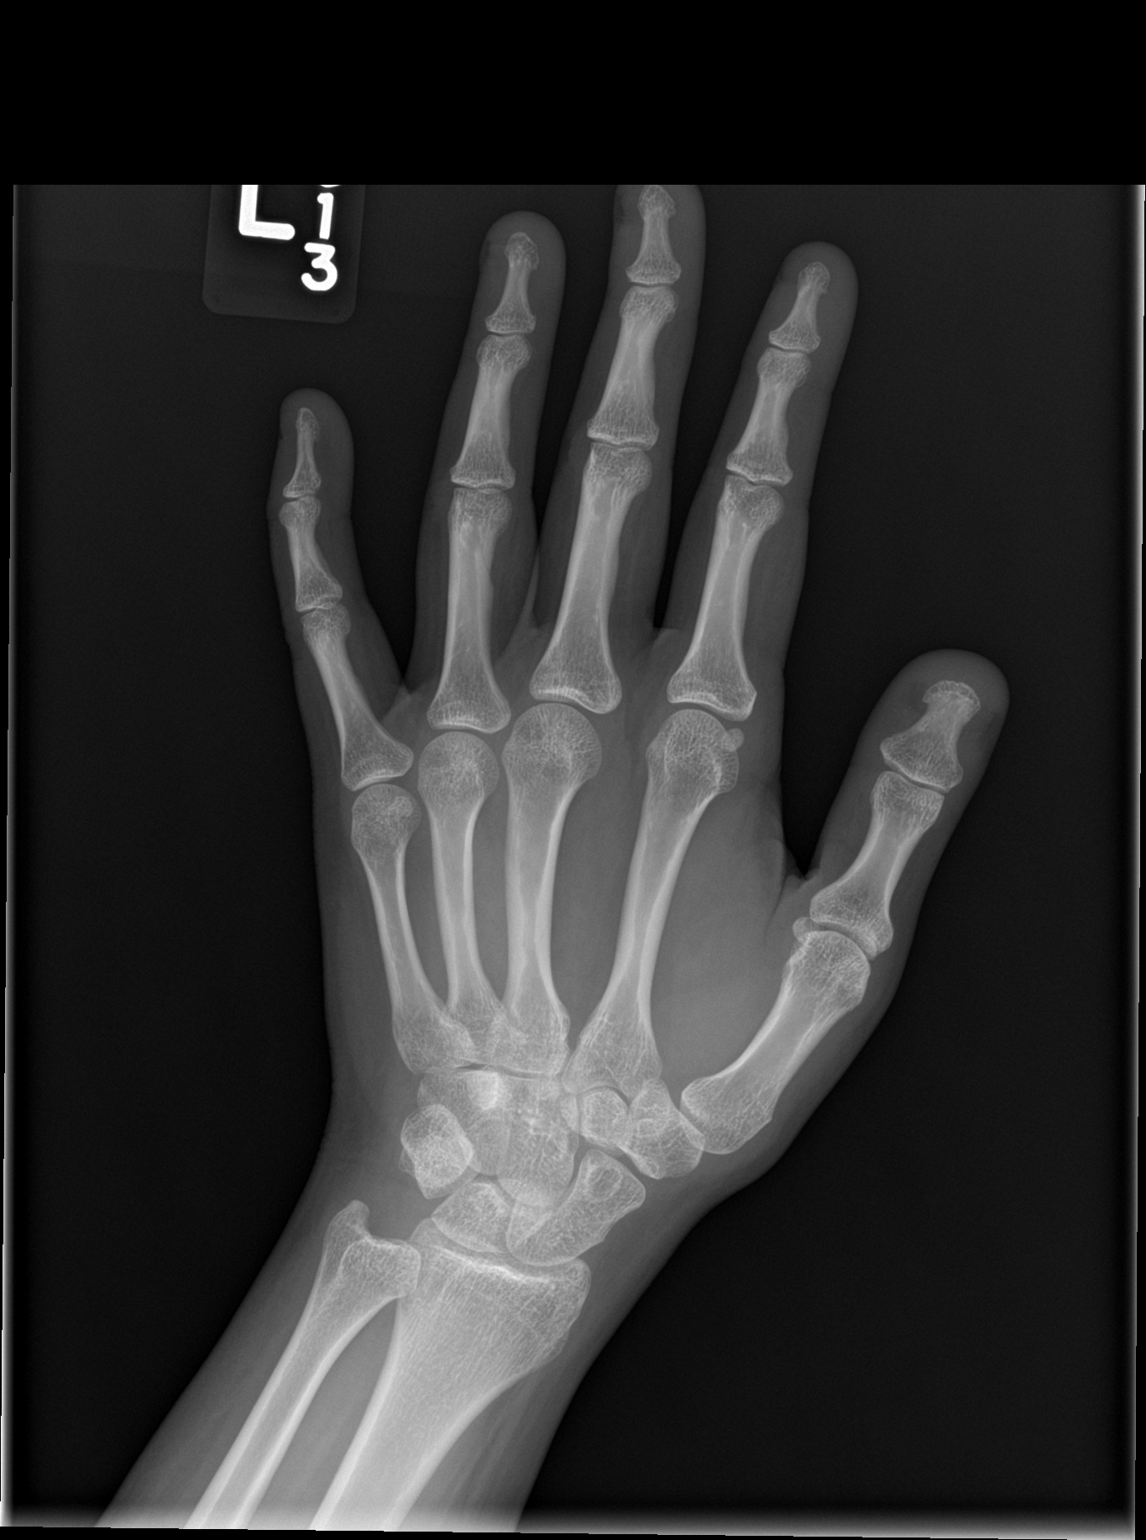
[im 3/3]
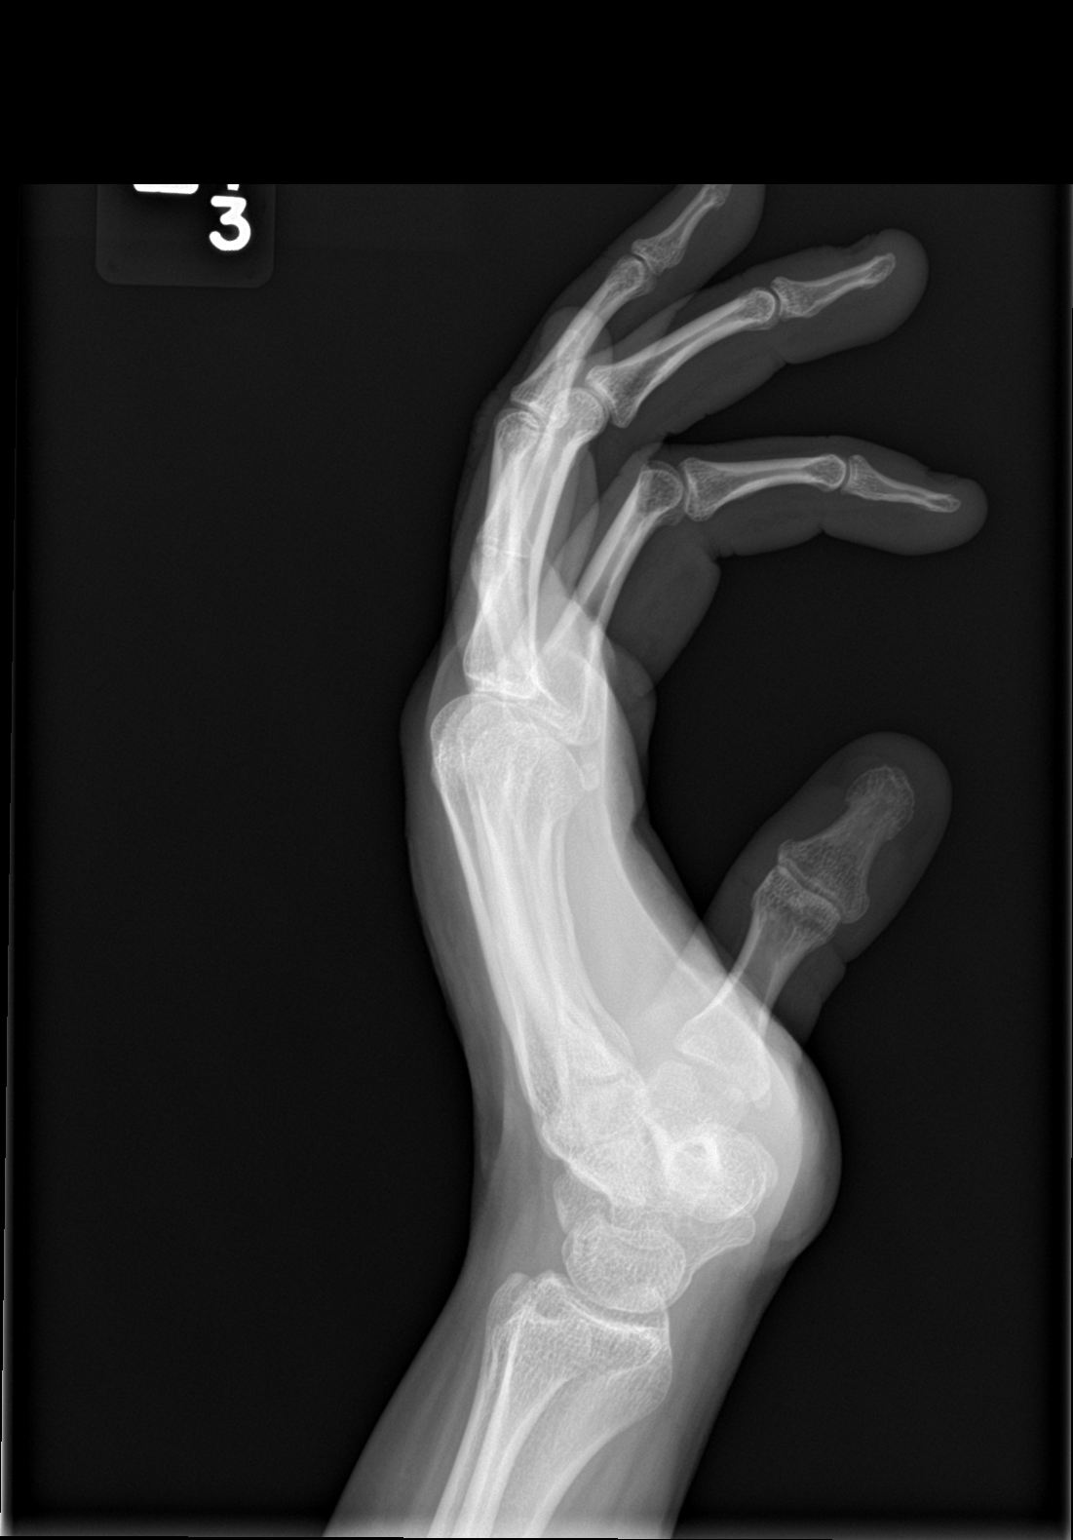

[3 of 3 positions shown; findings below may reference images not displayed]

FINDINGS: Frontal, oblique, and lateral views were obtained. There is no
fracture or dislocation. Joint spaces appear normal. No erosive
change. There is a benign cystic area in the distal scaphoid. No
soft tissue air. There is soft tissue swelling dorsally.
IMPRESSION: Soft tissue swelling dorsally. Benign cystic area in distal
scaphoid. No fracture or dislocation. No apparent arthropathy or
erosion. No bony destruction. No soft tissue abscess identified
radiographically.
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
## 2018-12-05 ENCOUNTER — Telehealth: Payer: Self-pay

## 2018-12-05 NOTE — Telephone Encounter (Signed)
[facility] called needing a letter stating it is okay for pt to receive TB placement.  Letter faxed to [phone]/confirmation received.

## 2018-12-10 ENCOUNTER — Encounter: Payer: Self-pay | Admitting: Obstetrics & Gynecology

## 2018-12-10 ENCOUNTER — Ambulatory Visit (INDEPENDENT_AMBULATORY_CARE_PROVIDER_SITE_OTHER): Payer: Medicaid Other | Admitting: Obstetrics & Gynecology

## 2018-12-10 ENCOUNTER — Other Ambulatory Visit (HOSPITAL_COMMUNITY)
Admission: RE | Admit: 2018-12-10 | Discharge: 2018-12-10 | Disposition: A | Discharge status: Home / Self Care | Payer: Medicaid Other | Source: Ambulatory Visit | Attending: Obstetrics & Gynecology | Admitting: Obstetrics & Gynecology

## 2018-12-10 ENCOUNTER — Other Ambulatory Visit: Payer: Self-pay

## 2018-12-10 VITALS — BP 110/70 | Wt 190.0 lb

## 2018-12-10 DIAGNOSIS — O0990 Supervision of high risk pregnancy, unspecified, unspecified trimester: Secondary | ICD-10-CM

## 2018-12-10 DIAGNOSIS — N926 Irregular menstruation, unspecified: Secondary | ICD-10-CM

## 2018-12-10 DIAGNOSIS — N3000 Acute cystitis without hematuria: Secondary | ICD-10-CM | POA: Diagnosis not present

## 2018-12-10 DIAGNOSIS — Z3201 Encounter for pregnancy test, result positive: Secondary | ICD-10-CM | POA: Diagnosis not present

## 2018-12-10 DIAGNOSIS — Z3A01 Less than 8 weeks gestation of pregnancy: Secondary | ICD-10-CM

## 2018-12-10 DIAGNOSIS — Z124 Encounter for screening for malignant neoplasm of cervix: Secondary | ICD-10-CM | POA: Insufficient documentation

## 2018-12-10 DIAGNOSIS — O09899 Supervision of other high risk pregnancies, unspecified trimester: Secondary | ICD-10-CM

## 2018-12-10 DIAGNOSIS — O09211 Supervision of pregnancy with history of pre-term labor, first trimester: Secondary | ICD-10-CM | POA: Diagnosis not present

## 2018-12-10 DIAGNOSIS — O9989 Other specified diseases and conditions complicating pregnancy, childbirth and the puerperium: Secondary | ICD-10-CM | POA: Diagnosis not present

## 2018-12-10 LAB — POCT URINALYSIS DIPSTICK
Bilirubin, UA: NEGATIVE
Blood, UA: NEGATIVE
Glucose, UA: NEGATIVE
Ketones, UA: NEGATIVE
Nitrite, UA: NEGATIVE
Protein, UA: NEGATIVE
Spec Grav, UA: 1.01 (ref 1.010–1.025)
Urobilinogen, UA: 0.2 E.U./dL
pH, UA: 5 (ref 5.0–8.0)

## 2018-12-10 LAB — POCT URINALYSIS DIPSTICK OB
Glucose, UA: NEGATIVE
POC,PROTEIN,UA: NEGATIVE

## 2018-12-10 LAB — POCT URINE PREGNANCY: Preg Test, Ur: POSITIVE — AB

## 2018-12-10 MED ORDER — NITROFURANTOIN MONOHYD MACRO 100 MG PO CAPS: 100.0000 mg | ORAL_CAPSULE | Freq: Two times a day (BID) | ORAL | 0 refills | Status: DC

## 2018-12-10 NOTE — Progress Notes (Signed)
12/10/2018   Chief Complaint: Missed period  Transfer of Care Patient: no  History of Present Illness: Ms. Dalsing is a 23 y.o. [id number] [redacted]w[redacted]d based on Patient's last menstrual period was 10/31/2018. with an Estimated Date of Delivery: 08/07/19, with the above CC.   Her periods were: regular periods every 28 days She was using no method when she conceived.  She has Positive signs or symptoms of nausea/vomiting of pregnancy. She has Negative signs or symptoms of miscarriage or preterm labor She identifies Negative Zika risk factors for her and her partner On any different medications around the time she conceived/early pregnancy: No  History of varicella: Yes   ROS: A 12-point review of systems was performed and negative, except as stated in the above HPI.  OBGYN History: As per HPI. OB History  Gravida Para Term Preterm AB Living  5 2 1 1 2 2   SAB TAB Ectopic Multiple Live Births  2     0 2    # Outcome Date GA Lbr Len/2nd Weight Sex Delivery Anes PTL Lv  5 Current           4 Term 08/12/17 [redacted]w[redacted]d 04:00 / 00:42 7 lb 8.6 oz (3.42 kg) M Vag-Spont EPI  LIV  3 Preterm 01/06/13 [redacted]w[redacted]d  5 lb 4 oz (2.381 kg) M Vag-Spont  Y LIV  2 SAB           1 SAB             Any issues with any prior pregnancies: yes 1. 22 weeks Cervical Insufficiency and PTD, survived 2. SAb 3. 38 week term delivery, Makena 4. SAb  Any prior children are healthy, doing well, without any problems or issues: yes History of pap smears: Yes. Last pap smear 2018. Abnormal: no  History of STIs: No   Past Medical History: Past Medical History:  Diagnosis Date  . Anxiety   . Chronic kidney disease    KIDNEY REFLUX  . Depression   . GERD (gastroesophageal reflux disease)    WITH PREGNANCY ONLY  . Headache    MIGRAINES  . History of methicillin resistant staphylococcus aureus (MRSA) 2008  . Pinched nerve   . Pyelonephritis   . Recurrent UTI     Past Surgical History: Past Surgical History:  Procedure  Laterality Date  . CHOLECYSTECTOMY N/A 03/07/2018   Procedure: LAPAROSCOPIC CHOLECYSTECTOMY;  Surgeon: Sung Amabile, DO;  Location: [facility] ORS;  Service: General;  Laterality: N/A;  . KIDNEY SURGERY    . KIDNEY SURGERY      Family History:  Family History  Problem Relation Age of Onset  . Diabetes Maternal Aunt   . Hypertension Neg Hx   . Ovarian cancer Neg Hx   . Breast cancer Neg Hx   . Heart disease Neg Hx    She denies any female cancers, bleeding or blood clotting disorders.  She denies any history of mental retardation, birth defects or genetic disorders in her or the FOB's history  Social History:  Social History   Socioeconomic History  . Marital status: Legally Separated    Spouse name: Not on file  . Number of children: Not on file  . Years of education: Not on file  . Highest education level: Not on file  Occupational History  . Not on file  Social Needs  . Financial resource strain: Not on file  . Food insecurity:    Worry: Not on file    Inability: Not on file  .  Transportation needs:    Medical: Not on file    Non-medical: Not on file  Tobacco Use  . Smoking status: Current Every Day Smoker    Packs/day: 0.50    Years: 8.00    Pack years: 4.00    Types: Cigarettes  . Smokeless tobacco: Never Used  Substance and Sexual Activity  . Alcohol use: No  . Drug use: No  . Sexual activity: Yes    Birth control/protection: None  Lifestyle  . Physical activity:    Days per week: Not on file    Minutes per session: Not on file  . Stress: Not on file  Relationships  . Social connections:    Talks on phone: Not on file    Gets together: Not on file    Attends religious service: Not on file    Active member of club or organization: Not on file    Attends meetings of clubs or organizations: Not on file    Relationship status: Not on file  . Intimate partner violence:    Fear of current or ex partner: Not on file    Emotionally abused: Not on file     Physically abused: Not on file    Forced sexual activity: Not on file  Other Topics Concern  . Not on file  Social History Narrative  . Not on file   Any pets in the household: no  Allergy: Allergies  Allergen Reactions  . Amoxicillin Anaphylaxis  . Vancomycin Itching    Patient with obvious "redmans" syndrome  . Penicillins Swelling    Has patient had a PCN reaction causing immediate rash, facial/tongue/throat swelling, SOB or lightheadedness with hypotension: Yes Has patient had a PCN reaction causing severe rash involving mucus membranes or skin necrosis: No Has patient had a PCN reaction that required hospitalization No Has patient had a PCN reaction occurring within the last 10 years: not sure  If all of the above answers are "NO", then may proceed with Cephalosporin use.    Current Outpatient Medications:  Current Outpatient Medications:  .  Prenatal Vit-Fe Fumarate-FA (PRENATAL VITAMIN PO), Take by mouth., Disp: , Rfl:  .  cyclobenzaprine (FLEXERIL) 10 MG tablet, Take 10 mg by mouth 3 (three) times daily as needed for muscle spasms., Disp: , Rfl: 5 .  gabapentin (NEURONTIN) 400 MG capsule, Take 400 mg by mouth daily as needed. , Disp: , Rfl: 5 .  nitrofurantoin, macrocrystal-monohydrate, (MACROBID) 100 MG capsule, Take 1 capsule (100 mg total) by mouth 2 (two) times daily., Disp: 10 capsule, Rfl: 0 .  Oxycodone HCl 10 MG TABS, Take 1 tablet (10 mg) by mouth every 4 hours as needed for pain, Disp: , Rfl:    Physical Exam:   BP 110/70   Wt 190 lb (86.2 kg)   LMP 10/31/2018   Breastfeeding Unknown   BMI 33.66 kg/m  Body mass index is 33.66 kg/m. Constitutional: Well nourished, well developed female in no acute distress.  Neck:  Supple, normal appearance, and no thyromegaly  Cardiovascular: S1, S2 normal, no murmur, rub or gallop, regular rate and rhythm Respiratory:  Clear to auscultation bilateral. Normal respiratory effort Abdomen: positive bowel sounds and no  masses, hernias; diffusely non tender to palpation, non distended Breasts: breasts appear normal, no suspicious masses, no skin or nipple changes or axillary nodes. Neuro/Psych:  Normal mood and affect.  Skin:  Warm and dry.  Lymphatic:  No inguinal lymphadenopathy.   Pelvic exam: is not limited by body  habitus EGBUS: within normal limits, Vagina: within normal limits and with no blood in the vault, Cervix: normal appearing cervix without discharge or lesions, closed/long/high, Uterus:  enlarged: 6 weeks, and Adnexa:  normal adnexa  Assessment: Ms. Susa GriffinsCagle is a 23 y.o. [id number] 7424w5d based on Patient's last menstrual period was 10/31/2018. with an Estimated Date of Delivery: 08/07/19,  for prenatal care.  Plan:  1) Avoid alcoholic beverages. 2) Patient encouraged not to smoke.  3) Discontinue the use of all non-medicinal drugs and chemicals.  4) Take prenatal vitamins daily.  5) Seatbelt use advised 6) Nutrition, food safety (fish, cheese advisories, and high nitrite foods) and exercise discussed. 7) Hospital and practice style [facility] discussed  8) Patient is asked about travel to areas at risk for the Zika virus, and counseled to avoid travel and exposure to mosquitoes or sexual partners who may have themselves been exposed to the virus. Testing is discussed, and will be ordered as appropriate.  9) [facility] advised 10) Genetic Screening, such as with 1st Trimester Screening, cell free fetal DNA, AFP testing, and Ultrasound, as well as with amniocentesis and CVS as appropriate, is discussed with patient. She plans to have genetic testing this pregnancy. 11) h/o PTL/PTD.  Successful Makena and term delivery afterwards.  Plan Makena 16-36 weeks.  She agrees. 12) Beta today, US soon, to establish viability.  Prior miscarriage a few mos ago upsetting to her. 6513) UTI today. Macrobid.  Problem list reviewed and updated.  Annamarie MajorPaul Salle Brandle, MD, Merlinda FrederickFACOG [facility], [facility] 12/10/2018  3:03 PM

## 2018-12-10 NOTE — Patient Instructions (Signed)

## 2018-12-10 NOTE — Addendum Note (Signed)
Addended by: Cornelius Moras D on: 12/10/2018 03:35 PM   Modules accepted: Orders

## 2018-12-11 LAB — DRUG SCREEN, URINE
Amphetamines, Urine: NEGATIVE ng/mL
Barbiturate screen, urine: NEGATIVE ng/mL
Benzodiazepine Quant, Ur: NEGATIVE ng/mL
Cannabinoid Quant, Ur: NEGATIVE ng/mL
Cocaine (Metab.): NEGATIVE ng/mL
Opiate Quant, Ur: NEGATIVE ng/mL
PCP Quant, Ur: NEGATIVE ng/mL

## 2018-12-11 LAB — RPR+RH+ABO+RUB AB+AB SCR+CB...
Antibody Screen: NEGATIVE
HIV Screen 4th Generation wRfx: NONREACTIVE
Hematocrit: 42.7 % (ref 34.0–46.6)
Hemoglobin: 14.8 g/dL (ref 11.1–15.9)
Hepatitis B Surface Ag: NEGATIVE
MCH: 31.5 pg (ref 26.6–33.0)
MCHC: 34.7 g/dL (ref 31.5–35.7)
MCV: 91 fL (ref 79–97)
Platelets: 327 10*3/uL (ref 150–450)
RBC: 4.7 x10E6/uL (ref 3.77–5.28)
RDW: 12.6 % (ref 11.7–15.4)
RPR Ser Ql: NONREACTIVE
Rh Factor: POSITIVE
Rubella Antibodies, IGG: 1.3 index (ref >0.99)
Varicella zoster IgG: 1834 index (ref >165)
WBC: 12 10*3/uL — ABNORMAL HIGH (ref 3.4–10.8)

## 2018-12-11 LAB — BETA HCG QUANT (REF LAB): hCG Quant: 51534 m[IU]/mL

## 2018-12-12 ENCOUNTER — Other Ambulatory Visit: Payer: Self-pay | Admitting: Obstetrics & Gynecology

## 2018-12-12 LAB — URINE CULTURE

## 2018-12-16 ENCOUNTER — Other Ambulatory Visit: Payer: Self-pay | Admitting: Obstetrics & Gynecology

## 2018-12-16 DIAGNOSIS — Z3491 Encounter for supervision of normal pregnancy, unspecified, first trimester: Secondary | ICD-10-CM

## 2018-12-16 LAB — CYTOLOGY - PAP
Chlamydia: NEGATIVE
Diagnosis: UNDETERMINED — AB
HPV: NOT DETECTED
Neisseria Gonorrhea: NEGATIVE
Trichomonas: NEGATIVE

## 2018-12-25 ENCOUNTER — Other Ambulatory Visit: Payer: Self-pay

## 2018-12-25 ENCOUNTER — Ambulatory Visit (INDEPENDENT_AMBULATORY_CARE_PROVIDER_SITE_OTHER): Payer: Medicaid Other | Admitting: Maternal Newborn

## 2018-12-25 ENCOUNTER — Ambulatory Visit (INDEPENDENT_AMBULATORY_CARE_PROVIDER_SITE_OTHER): Payer: Medicaid Other

## 2018-12-25 VITALS — BP 102/60 | Wt 187.0 lb

## 2018-12-25 DIAGNOSIS — N8311 Corpus luteum cyst of right ovary: Secondary | ICD-10-CM

## 2018-12-25 DIAGNOSIS — Z3A08 8 weeks gestation of pregnancy: Secondary | ICD-10-CM

## 2018-12-25 DIAGNOSIS — J45909 Unspecified asthma, uncomplicated: Secondary | ICD-10-CM

## 2018-12-25 DIAGNOSIS — O99519 Diseases of the respiratory system complicating pregnancy, unspecified trimester: Secondary | ICD-10-CM

## 2018-12-25 DIAGNOSIS — O3481 Maternal care for other abnormalities of pelvic organs, first trimester: Secondary | ICD-10-CM

## 2018-12-25 DIAGNOSIS — Z3491 Encounter for supervision of normal pregnancy, unspecified, first trimester: Secondary | ICD-10-CM

## 2018-12-25 DIAGNOSIS — O99511 Diseases of the respiratory system complicating pregnancy, first trimester: Secondary | ICD-10-CM

## 2018-12-25 DIAGNOSIS — F419 Anxiety disorder, unspecified: Secondary | ICD-10-CM | POA: Insufficient documentation

## 2018-12-25 DIAGNOSIS — O0990 Supervision of high risk pregnancy, unspecified, unspecified trimester: Secondary | ICD-10-CM | POA: Insufficient documentation

## 2018-12-25 DIAGNOSIS — Z3A01 Less than 8 weeks gestation of pregnancy: Secondary | ICD-10-CM

## 2018-12-25 MED ORDER — ALBUTEROL SULFATE HFA 108 (90 BASE) MCG/ACT IN AERS: 2.0000 | INHALATION_SPRAY | Freq: Four times a day (QID) | RESPIRATORY_TRACT | 2 refills | Status: DC | PRN

## 2018-12-25 NOTE — Progress Notes (Signed)
ROB/US C/o nausea, and cramping and fatigue
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
## 2018-12-29 ENCOUNTER — Encounter: Payer: Self-pay | Admitting: Maternal Newborn

## 2018-12-29 NOTE — Patient Instructions (Signed)
First Trimester of Pregnancy  The first trimester of pregnancy is from week 1 until the end of week 13 (months 1 through 3). A week after a sperm fertilizes an egg, the egg will implant on the wall of the uterus. This embryo will begin to develop into a baby. Genes from you and your partner will form the baby. The female genes will determine whether the baby will be a boy or a girl. At 6-8 weeks, the eyes and face will be formed, and the heartbeat can be seen on ultrasound. At the end of 12 weeks, all the baby's organs will be formed.  Now that you are pregnant, you will want to do everything you can to have a healthy baby. Two of the most important things are to get good prenatal care and to follow your health care provider's instructions. Prenatal care is all the medical care you receive before the baby's birth. This care will help prevent, find, and treat any problems during the pregnancy and childbirth.  Body changes during your first trimester  Your body goes through many changes during pregnancy. The changes vary from woman to woman.   You may gain or lose a couple of pounds at first.   You may feel sick to your stomach (nauseous) and you may throw up (vomit). If the vomiting is uncontrollable, call your health care provider.   You may tire easily.   You may develop headaches that can be relieved by medicines. All medicines should be approved by your health care provider.   You may urinate more often. Painful urination may mean you have a bladder infection.   You may develop heartburn as a result of your pregnancy.   You may develop constipation because certain hormones are causing the muscles that push stool through your intestines to slow down.   You may develop hemorrhoids or swollen veins (varicose veins).   Your breasts may begin to grow larger and become tender. Your nipples may stick out more, and the tissue that surrounds them (areola) may become darker.   Your gums may bleed and may be  sensitive to brushing and flossing.   Dark spots or blotches (chloasma, mask of pregnancy) may develop on your face. This will likely fade after the baby is born.   Your menstrual periods will stop.   You may have a loss of appetite.   You may develop cravings for certain kinds of food.   You may have changes in your emotions from day to day, such as being excited to be pregnant or being concerned that something may go wrong with the pregnancy and baby.   You may have more vivid and strange dreams.   You may have changes in your hair. These can include thickening of your hair, rapid growth, and changes in texture. Some women also have hair loss during or after pregnancy, or hair that feels dry or thin. Your hair will most likely return to normal after your baby is born.  What to expect at prenatal visits  During a routine prenatal visit:   You will be weighed to make sure you and the baby are growing normally.   Your blood pressure will be taken.   Your abdomen will be measured to track your baby's growth.   The fetal heartbeat will be listened to between weeks 10 and 14 of your pregnancy.   Test results from any previous visits will be discussed.  Your health care provider may ask you:     How you are feeling.   If you are feeling the baby move.   If you have had any abnormal symptoms, such as leaking fluid, bleeding, severe headaches, or abdominal cramping.   If you are using any tobacco products, including cigarettes, chewing tobacco, and electronic cigarettes.   If you have any questions.  Other tests that may be performed during your first trimester include:   Blood tests to find your blood type and to check for the presence of any previous infections. The tests will also be used to check for low iron levels (anemia) and protein on red blood cells (Rh antibodies). Depending on your risk factors, or if you previously had diabetes during pregnancy, you may have tests to check for high blood sugar  that affects pregnant women (gestational diabetes).   Urine tests to check for infections, diabetes, or protein in the urine.   An ultrasound to confirm the proper growth and development of the baby.   Fetal screens for spinal cord problems (spina bifida) and Down syndrome.   HIV (human immunodeficiency virus) testing. Routine prenatal testing includes screening for HIV, unless you choose not to have this test.   You may need other tests to make sure you and the baby are doing well.  Follow these instructions at home:  Medicines   Follow your health care provider's instructions regarding medicine use. Specific medicines may be either safe or unsafe to take during pregnancy.   Take a prenatal vitamin that contains at least 600 micrograms (mcg) of folic acid.   If you develop constipation, try taking a stool softener if your health care provider approves.  Eating and drinking     Eat a balanced diet that includes fresh fruits and vegetables, whole grains, good sources of protein such as meat, eggs, or tofu, and low-fat dairy. Your health care provider will help you determine the amount of weight gain that is right for you.   Avoid raw meat and uncooked cheese. These carry germs that can cause birth defects in the baby.   Eating four or five small meals rather than three large meals a day may help relieve nausea and vomiting. If you start to feel nauseous, eating a few soda crackers can be helpful. Drinking liquids between meals, instead of during meals, also seems to help ease nausea and vomiting.   Limit foods that are high in fat and processed sugars, such as fried and sweet foods.   To prevent constipation:  ? Eat foods that are high in fiber, such as fresh fruits and vegetables, whole grains, and beans.  ? Drink enough fluid to keep your urine clear or pale yellow.  Activity   Exercise only as directed by your health care provider. Most women can continue their usual exercise routine during  pregnancy. Try to exercise for 30 minutes at least 5 days a week. Exercising will help you:  ? Control your weight.  ? Stay in shape.  ? Be prepared for labor and delivery.   Experiencing pain or cramping in the lower abdomen or lower back is a good sign that you should stop exercising. Check with your health care provider before continuing with normal exercises.   Try to avoid standing for long periods of time. Move your legs often if you must stand in one place for a long time.   Avoid heavy lifting.   Wear low-heeled shoes and practice good posture.   You may continue to have sex unless your health care   provider tells you not to.  Relieving pain and discomfort   Wear a good support bra to relieve breast tenderness.   Take warm sitz baths to soothe any pain or discomfort caused by hemorrhoids. Use hemorrhoid cream if your health care provider approves.   Rest with your legs elevated if you have leg cramps or low back pain.   If you develop varicose veins in your legs, wear support hose. Elevate your feet for 15 minutes, 3-4 times a day. Limit salt in your diet.  Prenatal care   Schedule your prenatal visits by the twelfth week of pregnancy. They are usually scheduled monthly at first, then more often in the last 2 months before delivery.   Write down your questions. Take them to your prenatal visits.   Keep all your prenatal visits as told by your health care provider. This is important.  Safety   Wear your seat belt at all times when driving.   Make a list of emergency phone numbers, including numbers for family, friends, the hospital, and police and fire departments.  General instructions   Ask your health care provider for a referral to a local prenatal education class. Begin classes no later than the beginning of month 6 of your pregnancy.   Ask for help if you have counseling or nutritional needs during pregnancy. Your health care provider can offer advice or refer you to specialists for help  with various needs.   Do not use hot tubs, steam rooms, or saunas.   Do not douche or use tampons or scented sanitary pads.   Do not cross your legs for long periods of time.   Avoid cat litter boxes and soil used by cats. These carry germs that can cause birth defects in the baby and possibly loss of the fetus by miscarriage or stillbirth.   Avoid all smoking, herbs, alcohol, and medicines not prescribed by your health care provider. Chemicals in these products affect the formation and growth of the baby.   Do not use any products that contain nicotine or tobacco, such as cigarettes and e-cigarettes. If you need help quitting, ask your health care provider. You may receive counseling support and other resources to help you quit.   Schedule a dentist appointment. At home, brush your teeth with a soft toothbrush and be gentle when you floss.  Contact a health care provider if:   You have dizziness.   You have mild pelvic cramps, pelvic pressure, or nagging pain in the abdominal area.   You have persistent nausea, vomiting, or diarrhea.   You have a bad smelling vaginal discharge.   You have pain when you urinate.   You notice increased swelling in your face, hands, legs, or ankles.   You are exposed to fifth disease or chickenpox.   You are exposed to German measles (rubella) and have never had it.  Get help right away if:   You have a fever.   You are leaking fluid from your vagina.   You have spotting or bleeding from your vagina.   You have severe abdominal cramping or pain.   You have rapid weight gain or loss.   You vomit blood or material that looks like coffee grounds.   You develop a severe headache.   You have shortness of breath.   You have any kind of trauma, such as from a fall or a car accident.  Summary   The first trimester of pregnancy is from week 1 until   the end of week 13 (months 1 through 3).   Your body goes through many changes during pregnancy. The changes vary from  woman to woman.   You will have routine prenatal visits. During those visits, your health care provider will examine you, discuss any test results you may have, and talk with you about how you are feeling.  This information is not intended to replace advice given to you by your health care provider. Make sure you discuss any questions you have with your health care provider.  Document Released: 06/26/2001 Document Revised: 06/13/2016 Document Reviewed: 06/13/2016  Elsevier Interactive Patient Education  2019 Elsevier Inc.

## 2019-01-08 ENCOUNTER — Encounter: Payer: Medicaid Other | Admitting: Obstetrics and Gynecology

## 2019-01-14 ENCOUNTER — Encounter: Payer: Medicaid Other | Admitting: Maternal Newborn

## 2019-01-15 ENCOUNTER — Encounter: Payer: Self-pay | Admitting: Obstetrics & Gynecology

## 2019-01-15 ENCOUNTER — Other Ambulatory Visit: Payer: Self-pay

## 2019-01-15 ENCOUNTER — Ambulatory Visit (INDEPENDENT_AMBULATORY_CARE_PROVIDER_SITE_OTHER): Payer: Medicaid Other | Admitting: Obstetrics & Gynecology

## 2019-01-15 VITALS — BP 100/60 | Wt 180.0 lb

## 2019-01-15 DIAGNOSIS — O26891 Other specified pregnancy related conditions, first trimester: Secondary | ICD-10-CM

## 2019-01-15 DIAGNOSIS — O09211 Supervision of pregnancy with history of pre-term labor, first trimester: Secondary | ICD-10-CM

## 2019-01-15 DIAGNOSIS — N898 Other specified noninflammatory disorders of vagina: Secondary | ICD-10-CM

## 2019-01-15 DIAGNOSIS — J45909 Unspecified asthma, uncomplicated: Secondary | ICD-10-CM

## 2019-01-15 DIAGNOSIS — O0990 Supervision of high risk pregnancy, unspecified, unspecified trimester: Secondary | ICD-10-CM

## 2019-01-15 DIAGNOSIS — Z1379 Encounter for other screening for genetic and chromosomal anomalies: Secondary | ICD-10-CM

## 2019-01-15 DIAGNOSIS — O0991 Supervision of high risk pregnancy, unspecified, first trimester: Secondary | ICD-10-CM

## 2019-01-15 DIAGNOSIS — O99511 Diseases of the respiratory system complicating pregnancy, first trimester: Secondary | ICD-10-CM

## 2019-01-15 DIAGNOSIS — O99519 Diseases of the respiratory system complicating pregnancy, unspecified trimester: Secondary | ICD-10-CM

## 2019-01-15 DIAGNOSIS — N3000 Acute cystitis without hematuria: Secondary | ICD-10-CM

## 2019-01-15 DIAGNOSIS — R300 Dysuria: Secondary | ICD-10-CM

## 2019-01-15 DIAGNOSIS — Z3A11 11 weeks gestation of pregnancy: Secondary | ICD-10-CM

## 2019-01-15 DIAGNOSIS — O09899 Supervision of other high risk pregnancies, unspecified trimester: Secondary | ICD-10-CM

## 2019-01-15 LAB — POCT URINALYSIS DIPSTICK OB
Glucose, UA: NEGATIVE
POC,PROTEIN,UA: NEGATIVE

## 2019-01-15 LAB — POCT URINALYSIS DIPSTICK
Bilirubin, UA: NEGATIVE
Blood, UA: NEGATIVE
Glucose, UA: NEGATIVE
Ketones, UA: NEGATIVE
Nitrite, UA: NEGATIVE
Protein, UA: NEGATIVE
Spec Grav, UA: 1.01 (ref 1.010–1.025)
Urobilinogen, UA: 0.2 E.U./dL
pH, UA: 5 (ref 5.0–8.0)

## 2019-01-15 MED ORDER — NITROFURANTOIN MONOHYD MACRO 100 MG PO CAPS: 100.0000 mg | ORAL_CAPSULE | Freq: Two times a day (BID) | ORAL | 0 refills | Status: DC

## 2019-01-15 NOTE — Patient Instructions (Signed)
Genetic Testing During Pregnancy Genetic testing during pregnancy is also called prenatal genetic testing. This type of testing can determine if your baby is at risk of being born with a disorder caused by abnormal genes or chromosomes (genetic disorder). Chromosomes contain genes that control how your baby will develop in your womb. There are many different genetic disorders. Examples of genetic disorders that may be found through genetic testing include Down syndrome and cystic fibrosis. Gene changes (mutations) can be passed down through families. Genetic testing is offered to all women before or during pregnancy. You can choose whether to have genetic testing. Why is genetic testing done? Genetic testing is done during pregnancy to find out whether your child is at risk for a genetic disorder. Having genetic testing allows you to:  Discuss your test results and options with a genetic counselor.  Prepare for a baby that may be born with a genetic disorder. Learning about the disorder ahead of time helps you be better prepared to manage it. Your health care providers can also be prepared in case your baby requires special care before or after birth.  Consider whether you want to continue with the pregnancy. In some cases, genetic testing may be done to learn about the traits a child will inherit. Types of genetic tests There are two basic types of genetic testing. Screening tests indicate whether your developing baby (fetus) is at higher risk for a genetic disorder. Diagnostic tests check actual fetal cells to diagnose a genetic disorder. Screening tests     Screening tests will not harm your baby. They are recommended for all pregnant women. Types of screening tests include:  Carrier screening. This test involves checking genes from both parents by testing their blood or saliva. The test checks to find out if the parents carry a genetic mutation that may be passed to a baby. In most cases,  both parents must carry the mutation for a baby to be at risk.  First trimester screening. This test combines a blood test with sound wave imaging of your baby (fetal ultrasound). This screening test checks for a risk of Down syndrome or other defects caused by having extra chromosomes. It also checks for defects of the heart, abdomen, or skeleton.  Second trimester screening also combines a blood test with a fetal ultrasound exam. It checks for a risk of genetic defects of the face, brain, spine, heart, or limbs.  Combined or sequential screening. This type of testing combines the results of first and second trimester screening. This type of testing may be more accurate than first or second trimester screening alone.  Cell-free DNA testing. This is a blood test that detects cells released by the placenta that get into the mother's blood. It can be used to check for a risk of Down syndrome, other extra chromosome syndromes, and disorders caused by abnormal numbers of sex chromosomes. This test can be done any time after 10 weeks of pregnancy.  Diagnostic tests Diagnostic tests carry slight risks of problems, including bleeding, infection, and loss of the pregnancy. These tests are done only if your baby is at risk for a genetic disorder. You may meet with a genetic counselor to discuss the risks and benefits before having diagnostic tests. Examples of diagnostic tests include:  Chorionic villus sampling (CVS). This involves a procedure to remove and test a sample of cells taken from the placenta. The procedure may be done between 10 and 12 weeks of pregnancy.  Amniocentesis. This involves a  procedure to remove and test a sample of fluid (amniotic fluid) and cells from the sac that surrounds the developing baby. The procedure may be done between 15 and 20 weeks of pregnancy. What do the results mean? For a screening test:  If the results are negative, it often means that your child is not at higher  risk. There is still a slight chance your child could have a genetic disorder.  If the results are positive, it does not mean your child will have a genetic disorder. It may mean that your child has a higher-than-normal risk for a genetic disorder. In that case, you may want to talk with a genetic counselor about whether you should have diagnostic genetic tests. For a diagnostic test:  If the result is negative, it is unlikely that your child will have a genetic disorder.  If the test is positive for a genetic disorder, it is likely that your child will have the disorder. The test may not tell how severe the disorder will be. Talk with your health care provider about your options. Questions to ask your health care provider Before talking to your health care provider about genetic testing, find out if there is a history of genetic disorders in your family. It may also help to know your family's ethnic origins. Then ask your health care provider the following questions:  Is my baby at risk for a genetic disorder?  What are the benefits of having genetic screening?  What tests are best for me and my baby?  What are the risks of each test?  If I get a positive result on a screening test, what is the next step?  Should I meet with a genetic counselor before having a diagnostic test?  Should my partner or other members of my family be tested?  How much do the tests cost? Will my insurance cover the testing? Summary  Genetic testing is done during pregnancy to find out whether your child is at risk for a genetic disorder.  Genetic testing is offered to all women before or during pregnancy. You can choose whether to have genetic testing.  There are two basic types of genetic testing. Screening tests indicate whether your developing baby (fetus) is at higher risk for a genetic disorder. Diagnostic tests check actual fetal cells to diagnose a genetic disorder.  If a diagnostic genetic test is  positive, talk with your health care provider about your options. This information is not intended to replace advice given to you by your health care provider. Make sure you discuss any questions you have with your health care provider. Document Released: 09/16/2017 Document Revised: 10/23/2018 Document Reviewed: 09/16/2017 Elsevier Patient Education  2020 [employer].

## 2019-01-15 NOTE — Progress Notes (Signed)
  Subjective  Pt has dysuria and scant vag discharge  Objective  BP 100/60   Wt 180 lb (81.6 kg)   LMP 10/31/2018   BMI 31.89 kg/m  General: NAD Pumonary: no increased work of breathing Abdomen: gravid, non-tender Extremities: no edema Psychiatric: mood appropriate, affect full SVE- scanty d/c; cervix normal  WET PREP:   no pathogens Findings are consistent with nonspecific vaginitis.  Assessment  23 y.o. [id number] at [redacted]w[redacted]d by  08/02/2019, by Ultrasound presenting for routine prenatal visit  Plan   Problem List Items Addressed This Visit    Supervision of high risk pregnancy    Due to prior h/o PTL/PTD   [redacted] weeks gestation of pregnancy      Relevant Orders   POCT urinalysis dipstick (Completed)   POC Urinalysis Dipstick OB (Completed) PNV   Encounter for genetic screening for Down Syndrome       Relevant Orders   MaterniT21 PLUS Core+SCA   History of preterm delivery, currently pregnant        Plan Makena 17OHP weeks 20-36   Asthma affecting pregnancy, antepartum    Under control.  Inhaler prn       Dysuria       Relevant Medications   nitrofurantoin, macrocrystal-monohydrate, (MACROBID) 100 MG capsule   Other Relevant Orders   Urine Culture   Acute cystitis without hematuria       Relevant Medications   nitrofurantoin, macrocrystal-monohydrate, (MACROBID) 100 MG capsule   Vaginal discharge        No sign of yeast infection or BV today       Problem Noted Resolved   Supervision of high risk pregnancy, antepartum 12/25/2018 by Rexene Agent, CNM No   Overview Signed 12/25/2018  4:48 PM by Rexene Agent, [facility]  Dating [place] Blood type: O/Positive/-- (05/27 1511)   Genetic Screen NIPS: today Antibody:Negative (05/27 1511)  Anatomic [place]  Rubella: 1.30 (05/27 1511) Varicella: Immune  GTT       Third trimester:  RPR: Non Reactive (05/27 1511)   Rhogam n/a HBsAg: Negative (05/27 1511)   TDaP vaccine                       Flu Shot:  HIV: Non Reactive (05/27 1511)   Baby Food                                GBS:   Contraception  Pap:  CBB     CS/VBAC no   Support Person                  Barnett Applebaum, MD, [place], [facility] 01/15/2019  2:51 PM

## 2019-01-17 LAB — URINE CULTURE

## 2019-01-21 ENCOUNTER — Telehealth: Payer: Self-pay

## 2019-01-21 NOTE — Telephone Encounter (Signed)
That is up to patient as far as whether she desires to work or not, may want to discuss more as a telehealth appt if can arrange w someone Thurs or Fri

## 2019-01-21 NOTE — Telephone Encounter (Signed)
Pt aware; states will go in to work tonight and see how things are and then will call to sched phone appt c PH.

## 2019-01-21 NOTE — Telephone Encounter (Signed)
Pt calling; job has made it public there are cases of covid preg care mngr wanted her to call us to see what to do since she is high risk - out of work or what the situation would be.  [phone]

## 2019-01-22 ENCOUNTER — Other Ambulatory Visit: Payer: Self-pay

## 2019-01-22 ENCOUNTER — Ambulatory Visit (INDEPENDENT_AMBULATORY_CARE_PROVIDER_SITE_OTHER): Payer: Medicaid Other | Admitting: Obstetrics and Gynecology

## 2019-01-22 ENCOUNTER — Encounter: Payer: Self-pay | Admitting: Obstetrics & Gynecology

## 2019-01-22 ENCOUNTER — Telehealth: Payer: Self-pay

## 2019-01-22 DIAGNOSIS — Z3481 Encounter for supervision of other normal pregnancy, first trimester: Secondary | ICD-10-CM | POA: Diagnosis not present

## 2019-01-22 DIAGNOSIS — O0990 Supervision of high risk pregnancy, unspecified, unspecified trimester: Secondary | ICD-10-CM

## 2019-01-22 DIAGNOSIS — Z3A12 12 weeks gestation of pregnancy: Secondary | ICD-10-CM | POA: Diagnosis not present

## 2019-01-22 NOTE — Progress Notes (Signed)
Virtual Visit via Telephone Note  I connected with Molly Bray on 01/22/19 at  8:50 AM EDT by telephone and verified that I am speaking with the correct person using two identifiers.   I discussed the limitations, risks, security and privacy concerns of performing an evaluation and management service by telephone and the availability of in person appointments. I also discussed with the patient that there may be a patient responsible charge related to this service. The patient expressed understanding and agreed to proceed.  The patient was at her mother's home I spoke with the patient from my  office The names of people involved in this encounter were: Jeananne Rama and Dr. Gilman Schmidt.   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 She has a lot of exposure to covid positive patients. She took precautions and was wearing the right equipment, but reports a lot of her colleagues were not. She would like to be written out of work. She does not want to go back until people do not have covid anymore. She does not report pregnancy problems at this time.    Observations/Objective:  Physical Exam could not be performed. Because of the COVID-19 outbreak this visit was performed over the phone and not in person.   Assessment and Plan: 23 yo [id number] [redacted]w[redacted]d Sent note to mychart for patient regarding work during [place] pandemic.  Despite this being a detailed note with regards to working during COVID-19 pandemic, precautions and being placed on medical leave patient does not seem satisfied and would like to discuss with Dr. Kenton Kingfisher.   Follow Up Instructions: 4 week ROB   I discussed the assessment and treatment plan with the patient. The patient was provided an opportunity to ask questions and all were answered. The patient agreed with the plan and demonstrated an understanding of the instructions.   The patient was  advised to call back or seek an in-person evaluation if the symptoms worsen or if the condition fails to improve as anticipated.  I provided 16 minutes of non-face-to-face time during this encounter.  Adrian Prows MD [facility], [facility] 01/22/2019 9:16 AM

## 2019-01-22 NOTE — Telephone Encounter (Signed)
Pt would like to have [place], Me or Janett Billow to call her and update her on what is going on. I have advised pt she has a telephone visit with [place] on 01/26/19 and she sated "I would like a call back today about this, I do not want to loose my job" Please see pervious note on the matter. Thank you

## 2019-01-22 NOTE — Telephone Encounter (Signed)
If she would like to be out of work as a precaution, then I will provide note supporting this plan by her.   Letter done, in MyChart for patient.

## 2019-01-22 NOTE — Telephone Encounter (Signed)
Pt had a telephone visit with CRS this morning, CRS wrote her a note stating that if there were certain things that her work could not provide for her then she should be allowed to be out of work. Pt called back and said the note that CRS wrote will not work for her to be taken out of work, Pt stated "I will not work with covid pts around I dont understand how [place] said he would write me a note to be out of work during this time and CRS will not" I advised pt that CRS cannot write her a note to be out of work unless there is a medically necessary reason. I read out loud the message in her chart by [facility] that says it is a pts choice wether or not she decides to work. Pt stated if she does not have a doctors note by next Tuesday she will be fired. I told pt I would write a note in her chart explaining our conversation in length so that [facility] can take a look at the notes and go from there. Pt is set up to have a telephone visit with [facility] on 01/26/19 to discuss this matter further.

## 2019-01-22 NOTE — Telephone Encounter (Signed)
Pt aware and grateful

## 2019-01-22 NOTE — Progress Notes (Signed)
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

## 2019-01-23 LAB — MATERNIT21 PLUS CORE+SCA
Fetal Fraction: 7
Monosomy X (Turner Syndrome): NOT DETECTED
Result (T21): NEGATIVE
Trisomy 13 (Patau syndrome): NEGATIVE
Trisomy 18 (Edwards syndrome): NEGATIVE
Trisomy 21 (Down syndrome): NEGATIVE
XXX (Triple X Syndrome): NOT DETECTED
XXY (Klinefelter Syndrome): NOT DETECTED
XYY (Jacobs Syndrome): NOT DETECTED

## 2019-01-26 ENCOUNTER — Telehealth: Payer: Self-pay

## 2019-01-26 ENCOUNTER — Ambulatory Visit (INDEPENDENT_AMBULATORY_CARE_PROVIDER_SITE_OTHER): Payer: Medicaid Other | Admitting: Obstetrics & Gynecology

## 2019-01-26 ENCOUNTER — Encounter: Payer: Self-pay | Admitting: Obstetrics & Gynecology

## 2019-01-26 ENCOUNTER — Other Ambulatory Visit: Payer: Self-pay | Admitting: Obstetrics & Gynecology

## 2019-01-26 ENCOUNTER — Other Ambulatory Visit: Payer: Self-pay

## 2019-01-26 DIAGNOSIS — Z3A13 13 weeks gestation of pregnancy: Secondary | ICD-10-CM

## 2019-01-26 DIAGNOSIS — O09211 Supervision of pregnancy with history of pre-term labor, first trimester: Secondary | ICD-10-CM

## 2019-01-26 DIAGNOSIS — O0991 Supervision of high risk pregnancy, unspecified, first trimester: Secondary | ICD-10-CM | POA: Diagnosis not present

## 2019-01-26 DIAGNOSIS — O99519 Diseases of the respiratory system complicating pregnancy, unspecified trimester: Secondary | ICD-10-CM

## 2019-01-26 DIAGNOSIS — O09899 Supervision of other high risk pregnancies, unspecified trimester: Secondary | ICD-10-CM

## 2019-01-26 DIAGNOSIS — J45909 Unspecified asthma, uncomplicated: Secondary | ICD-10-CM

## 2019-01-26 DIAGNOSIS — O0990 Supervision of high risk pregnancy, unspecified, unspecified trimester: Secondary | ICD-10-CM

## 2019-01-26 DIAGNOSIS — O99511 Diseases of the respiratory system complicating pregnancy, first trimester: Secondary | ICD-10-CM

## 2019-01-26 MED ORDER — HYDROXYPROGESTERONE CAPROATE 250 MG/ML IM OIL: 250.0000 mg | TOPICAL_OIL | Freq: Once | INTRAMUSCULAR | 4 refills | Status: DC

## 2019-01-26 NOTE — Telephone Encounter (Signed)
makena needs to be ordered from [facility] or [facility].
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
## 2019-01-27 ENCOUNTER — Telehealth: Payer: Self-pay

## 2019-01-27 NOTE — Telephone Encounter (Signed)
Pt calling to let [place] know her FMLA has been sent over to be filled out and returned to her job.  [phone]

## 2019-02-03 ENCOUNTER — Telehealth: Payer: Self-pay | Admitting: Obstetrics & Gynecology

## 2019-02-03 NOTE — Telephone Encounter (Signed)
Left message for patient about her FMLA forms. Pt needs to fill out forms and pay fee so they can be processed.  ° °

## 2019-02-03 NOTE — Telephone Encounter (Signed)
Patient aware.

## 2019-02-06 ENCOUNTER — Encounter: Payer: Medicaid Other | Admitting: Obstetrics & Gynecology

## 2019-02-09 ENCOUNTER — Telehealth: Payer: Self-pay

## 2019-02-09 NOTE — Telephone Encounter (Signed)
FMLA/DISABILITY forms (2) for [employer] filled out, signature obtained and given to KT for processing.

## 2019-02-16 ENCOUNTER — Encounter: Payer: Self-pay | Admitting: Obstetrics & Gynecology

## 2019-02-16 ENCOUNTER — Ambulatory Visit (INDEPENDENT_AMBULATORY_CARE_PROVIDER_SITE_OTHER): Payer: Medicaid Other | Admitting: Obstetrics & Gynecology

## 2019-02-16 ENCOUNTER — Other Ambulatory Visit: Payer: Self-pay

## 2019-02-16 VITALS — BP 100/60 | Wt 178.0 lb

## 2019-02-16 DIAGNOSIS — O9989 Other specified diseases and conditions complicating pregnancy, childbirth and the puerperium: Secondary | ICD-10-CM

## 2019-02-16 DIAGNOSIS — O09212 Supervision of pregnancy with history of pre-term labor, second trimester: Secondary | ICD-10-CM | POA: Diagnosis not present

## 2019-02-16 DIAGNOSIS — N3000 Acute cystitis without hematuria: Secondary | ICD-10-CM

## 2019-02-16 DIAGNOSIS — Z3A16 16 weeks gestation of pregnancy: Secondary | ICD-10-CM

## 2019-02-16 DIAGNOSIS — R300 Dysuria: Secondary | ICD-10-CM

## 2019-02-16 DIAGNOSIS — Z8751 Personal history of pre-term labor: Secondary | ICD-10-CM

## 2019-02-16 LAB — POCT URINALYSIS DIPSTICK
Bilirubin, UA: NEGATIVE
Blood, UA: NEGATIVE
Glucose, UA: NEGATIVE
Ketones, UA: NEGATIVE
Nitrite, UA: NEGATIVE
Protein, UA: NEGATIVE
Spec Grav, UA: 1.01 (ref 1.010–1.025)
Urobilinogen, UA: 0.2 E.U./dL
pH, UA: 5 (ref 5.0–8.0)

## 2019-02-16 LAB — POCT URINALYSIS DIPSTICK OB
Glucose, UA: NEGATIVE
POC,PROTEIN,UA: NEGATIVE

## 2019-02-16 MED ORDER — HYDROXYPROGESTERONE CAPROATE 250 MG/ML IM OIL
250.0000 mg | TOPICAL_OIL | Freq: Once | INTRAMUSCULAR | Status: AC
  Administered 2019-02-16: 250 mg via INTRAMUSCULAR

## 2019-02-16 MED ORDER — CEPHALEXIN 500 MG PO CAPS: 500.0000 mg | ORAL_CAPSULE | Freq: Four times a day (QID) | ORAL | 0 refills | Status: DC

## 2019-02-16 NOTE — Progress Notes (Signed)
HPI:      [name] is a 23 y.o. [id number] who LMP was Patient's last menstrual period was 10/31/2018., presents today for a problem visit.    Urinary Tract Infection: Patient complains of dysuria . She has had symptoms for 2 days. Patient also complains of back pain. Patient denies fever. Patient does have a history of recurrent UTI.  Patient does not have a history of pyelonephritis. Recent Macrobid Rx but unable to take due to nausea.  PMHx: She  has a past medical history of Anxiety, Chronic kidney disease, Depression, GERD (gastroesophageal reflux disease), Headache, History of methicillin resistant staphylococcus aureus (MRSA) (2008), Pinched nerve, Pyelonephritis, and Recurrent UTI. Also,  has a past surgical history that includes Kidney surgery; Kidney surgery; and Cholecystectomy (N/A, 03/07/2018)., family history includes Diabetes in her maternal aunt.,  reports that she has been smoking cigarettes. She has a 4.00 pack-year smoking history. She has never used smokeless tobacco. She reports that she does not drink alcohol or use drugs.  She has a current medication list which includes the following prescription(s): albuterol, cephalexin, hydroxyprogesterone caproate, nitrofurantoin (macrocrystal-monohydrate), and prenatal vit-fe fumarate-fa. Also, is allergic to amoxicillin; vancomycin; penicillins; and silver sulfadiazine.  Review of Systems  Constitutional: Negative for chills, fever and malaise/fatigue.  HENT: Negative for congestion, sinus pain and sore throat.   Eyes: Negative for blurred vision and pain.  Respiratory: Negative for cough and wheezing.   Cardiovascular: Negative for chest pain and leg swelling.  Gastrointestinal: Negative for abdominal pain, constipation, diarrhea, heartburn, nausea and vomiting.  Genitourinary: Negative for dysuria, frequency, hematuria and urgency.  Musculoskeletal: Negative for back pain, joint pain, myalgias and neck pain.  Skin:  Negative for itching and rash.  Neurological: Negative for dizziness, tremors and weakness.  Endo/Heme/Allergies: Does not bruise/bleed easily.  Psychiatric/Behavioral: Negative for depression. The patient is not nervous/anxious and does not have insomnia.     Objective: BP 100/60   Wt 178 lb (80.7 kg)   LMP 10/31/2018   BMI 31.53 kg/m  Physical Exam Constitutional:      General: She is not in acute distress.    Appearance: She is well-developed.  Musculoskeletal: Normal range of motion.  Neurological:     Mental Status: She is alert and oriented to person, place, and time.  Skin:    General: Skin is warm and dry.  Vitals signs reviewed.   BACK: No CVAT FHT 160s  Results for orders placed or performed in visit on 02/16/19  POC Urinalysis Dipstick OB  Result Value Ref Range   Color, UA     Clarity, UA     Glucose, UA Negative Negative   Bilirubin, UA     Ketones, UA     Spec Grav, UA     Blood, UA     pH, UA     POC,PROTEIN,UA Negative Negative, Trace, Small (1+), Moderate (2+), Large (3+), 4+   Urobilinogen, UA     Nitrite, UA     Leukocytes, UA     Appearance     Odor    POCT urinalysis dipstick  Result Value Ref Range   Color, UA     Clarity, UA     Glucose, UA Negative Negative   Bilirubin, UA neg    Ketones, UA neg    Spec Grav, UA 1.010 1.010 - 1.025   Blood, UA neg    pH, UA 5.0 5.0 - 8.0   Protein, UA Negative Negative   Urobilinogen, UA  0.2 0.2 or 1.0 E.U./dL   Nitrite, UA neg    Leukocytes, UA Moderate (2+) (A) Negative   Appearance     Odor      ASSESSMENT/PLAN:   Acute cystitis  Problem List Items Addressed This Visit    [redacted] weeks gestation of pregnancy       PNV US 4 weeks      Dysuria       RelKoreaevant Medications   cephALEXin (KEFLEX) 500 MG capsule   Other Relevant Orders   POCT urinalysis dipstick (Completed)   Urine Culture   History of preterm labor        Makena injection weekly, first shot today    Acute cystitis without  hematuria       Relevant Medications   cephALEXin (KEFLEX) 500 MG capsule   Other Relevant Orders   Urine Culture      Annamarie MajorPaul Blanch Stang, MD, Merlinda FrederickFACOG [facility], [facility] 02/16/2019  9:44 AM

## 2019-02-18 LAB — URINE CULTURE

## 2019-02-23 ENCOUNTER — Other Ambulatory Visit: Payer: Self-pay

## 2019-02-23 ENCOUNTER — Ambulatory Visit (INDEPENDENT_AMBULATORY_CARE_PROVIDER_SITE_OTHER): Payer: Medicaid Other

## 2019-02-23 ENCOUNTER — Ambulatory Visit: Payer: Medicaid Other

## 2019-02-23 DIAGNOSIS — O09212 Supervision of pregnancy with history of pre-term labor, second trimester: Secondary | ICD-10-CM

## 2019-02-23 DIAGNOSIS — Z3A17 17 weeks gestation of pregnancy: Secondary | ICD-10-CM | POA: Diagnosis not present

## 2019-02-23 DIAGNOSIS — O09899 Supervision of other high risk pregnancies, unspecified trimester: Secondary | ICD-10-CM

## 2019-02-23 MED ORDER — HYDROXYPROGESTERONE CAPROATE 250 MG/ML IM OIL
250.0000 mg | TOPICAL_OIL | Freq: Once | INTRAMUSCULAR | Status: AC
  Administered 2019-02-23: 250 mg via INTRAMUSCULAR

## 2019-02-23 NOTE — Progress Notes (Signed)
Patient presents today for Hydroxyprogesterone injection within dates. Given IM RUOQ. Patient tolerated well.  

## 2019-02-26 ENCOUNTER — Ambulatory Visit (INDEPENDENT_AMBULATORY_CARE_PROVIDER_SITE_OTHER): Payer: Medicaid Other | Admitting: Obstetrics & Gynecology

## 2019-02-26 ENCOUNTER — Encounter: Payer: Self-pay | Admitting: Obstetrics & Gynecology

## 2019-02-26 ENCOUNTER — Other Ambulatory Visit: Payer: Self-pay

## 2019-02-26 VITALS — BP 100/70 | Wt 183.0 lb

## 2019-02-26 DIAGNOSIS — Z3A17 17 weeks gestation of pregnancy: Secondary | ICD-10-CM

## 2019-02-26 DIAGNOSIS — K439 Ventral hernia without obstruction or gangrene: Secondary | ICD-10-CM

## 2019-02-26 DIAGNOSIS — O9989 Other specified diseases and conditions complicating pregnancy, childbirth and the puerperium: Secondary | ICD-10-CM

## 2019-02-26 NOTE — Progress Notes (Signed)
Gynecology Pelvic Pain Evaluation   Chief Complaint  Patient presents with  . Abdominal Pain    History of Present Illness:   Patient is a 23 y.o. [id number] who LMP was Patient's last menstrual period was 10/31/2018. as she is [redacted] weeks pregnant, presents today for a problem visit.  She complains of upper midline abdominal pain since Sunday.   Her pain is localized to the epigastric and periumbilical area, described as sharp and stabbing, began several days ago and its severity is described as severe. The pain radiates to the  Non-radiating. She has these associated symptoms which include heartburn and nausea although she has had these sx's w her pregnancy. Patient has these modifiers which include relaxation that make it better and unable to associate with any factor that make it worse.  Context includes: spontaneous.  No prior hernia.  Prior chole.    Pt is seen for high risk pregnancy related to preterm labor; takes weekly injections for prevention.  PMHx: She  has a past medical history of Anxiety, Chronic kidney disease, Depression, GERD (gastroesophageal reflux disease), Headache, History of methicillin resistant staphylococcus aureus (MRSA) (2008), Pinched nerve, Pyelonephritis, and Recurrent UTI. Also,  has a past surgical history that includes Kidney surgery; Kidney surgery; and Cholecystectomy (N/A, 03/07/2018)., family history includes Diabetes in her maternal aunt.,  reports that she has been smoking cigarettes. She has a 4.00 pack-year smoking history. She has never used smokeless tobacco. She reports that she does not drink alcohol or use drugs.  She has a current medication list which includes the following prescription(s): albuterol, cephalexin, hydroxyprogesterone caproate, nitrofurantoin (macrocrystal-monohydrate), and prenatal vit-fe fumarate-fa. Also, is allergic to amoxicillin; vancomycin; penicillins; and silver sulfadiazine.  Review of Systems  Constitutional: Negative for  chills, fever and malaise/fatigue.  HENT: Negative for congestion, sinus pain and sore throat.   Eyes: Negative for blurred vision and pain.  Respiratory: Negative for cough and wheezing.   Cardiovascular: Negative for chest pain and leg swelling.  Gastrointestinal: Negative for abdominal pain, constipation, diarrhea, heartburn, nausea and vomiting.  Genitourinary: Negative for dysuria, frequency, hematuria and urgency.  Musculoskeletal: Negative for back pain, joint pain, myalgias and neck pain.  Skin: Negative for itching and rash.  Neurological: Negative for dizziness, tremors and weakness.  Endo/Heme/Allergies: Does not bruise/bleed easily.  Psychiatric/Behavioral: Negative for depression. The patient is not nervous/anxious and does not have insomnia.     Objective: BP 100/70   Wt 183 lb (83 kg)   LMP 10/31/2018   BMI 32.42 kg/m  Physical Exam Constitutional:      General: She is not in acute distress.    Appearance: She is well-developed.  Abdominal:     General: Bowel sounds are normal. There is no distension.     Palpations: Abdomen is soft.     Tenderness: There is abdominal tenderness in the epigastric area. There is no guarding or rebound. Negative signs include Rovsing's sign and McBurney's sign.     Hernia: A hernia is present. Hernia is present in the umbilical area.     Comments: FHT 150s Appropriate uterine size for 17 weeks (below umb)  Musculoskeletal: Normal range of motion.  Neurological:     Mental Status: She is alert and oriented to person, place, and time.  Skin:    General: Skin is warm and dry.  Vitals signs reviewed.     Female chaperone present for pelvic portion of the physical exam  Assessment: 23 y.o. [id number] with pain related to hernia  at 17 weeks pregnancy; pregnancy not direct cause of pain.  No sign of strangulation, obstruction, incarceration..  Problem List Items Addressed This Visit    Abdominal pain Hernia of anterior abdominal wall        Relevant Orders   Ambulatory referral to [place], MD, [facility], [facility] 02/26/2019  10:48 AM

## 2019-03-03 ENCOUNTER — Other Ambulatory Visit: Payer: Self-pay

## 2019-03-03 ENCOUNTER — Encounter: Payer: Self-pay | Admitting: General Surgery

## 2019-03-03 ENCOUNTER — Ambulatory Visit (INDEPENDENT_AMBULATORY_CARE_PROVIDER_SITE_OTHER): Payer: Medicaid Other | Admitting: General Surgery

## 2019-03-03 VITALS — BP 100/68 | HR 92 | Temp 97.9°F | Ht 63.0 in | Wt 180.0 lb

## 2019-03-03 DIAGNOSIS — K429 Umbilical hernia without obstruction or gangrene: Secondary | ICD-10-CM

## 2019-03-03 NOTE — Progress Notes (Signed)
Patient ID: Molly StallionElizabeth Renea Bray, female   DOB: 09/01/1995, 23 y.o.   MRN: [id number]  Chief Complaint  Patient presents with  . New Patient (Initial Visit)    new pt ref Dr.Harris abdominal wall hernia    HPI Molly Bray is a 23 y.o. female. She was referred by her OB/GYN, Dr. Velora Mediateobert Harris, for evaluation of a possible umbilical hernia.  Molly Bray gives a history of being told recently that she had an umbilical hernia as a child, but that it was small and was not repaired.  She states that her navel has always been exquisitely sensitive to contact or touch.  She is currently [redacted] weeks pregnant and was seen last week by Dr. Tiburcio PeaHarris.  She was complaining of pain in the area of her umbilicus.  She was referred to general surgery to evaluate for a hernia.  She has had nausea and vomiting associated with her pregnancy, but nothing worse recently.  No obstipation or other symptoms of bowel obstruction secondary to incarceration.  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   Past Medical History:  Diagnosis Date  . Anxiety   . Asthma   . Chronic kidney disease    KIDNEY REFLUX  . Depression   . GERD (gastroesophageal reflux disease)    WITH PREGNANCY ONLY  . Headache    MIGRAINES  . History of methicillin resistant staphylococcus aureus (MRSA) 2008  . IBS (irritable bowel syndrome)    constipation  . Pinched nerve   . Pyelonephritis   . Recurrent UTI     Past Surgical History:  Procedure Laterality Date  . CHOLECYSTECTOMY N/A 03/07/2018   Procedure: LAPAROSCOPIC CHOLECYSTECTOMY;  Surgeon: Sung AmabileSakai, Isami, DO;  Location: [facility] ORS;  Service: General;  Laterality: N/A;  . KIDNEY SURGERY      Family History  Problem Relation Age of Onset  . COPD  Mother   . Diabetes Maternal Aunt   . COPD Father   . Hypertension Neg Hx   . Ovarian cancer Neg Hx   . Breast cancer Neg Hx   . Heart disease Neg Hx     Social History Social History   Tobacco Use  . Smoking status: Current Every Day Smoker    Packs/day: 0.50    Years: 8.00    Pack years: 4.00    Types: Cigarettes  . Smokeless tobacco: Never Used  Substance Use Topics  . Alcohol use: No  . Drug use: No    Allergies  Allergen Reactions  . Amoxicillin Anaphylaxis  . Vancomycin Itching    Patient with obvious "redmans" syndrome  . Penicillins Swelling    Has patient had a PCN reaction causing immediate rash, facial/tongue/throat swelling, SOB or lightheadedness with hypotension: Yes Has patient had a PCN reaction causing severe rash involving mucus membranes or skin necrosis: No Has patient had a PCN reaction that required hospitalization No Has patient had a PCN reaction occurring within the last 10 years: not sure  If all of the above answers are "NO", then may proceed with Cephalosporin use.  Lyman Bishop. Silver Sulfadiazine Other (See Comments)    Pain/burning sensation    Current Outpatient Medications  Medication Sig Dispense Refill  . albuterol (VENTOLIN HFA) 108 (90 Base) MCG/ACT  inhaler Inhale 2 puffs into the lungs every 6 (six) hours as needed for wheezing or shortness of breath. 1 Inhaler 2  . Prenatal Vit-Fe Fumarate-FA (PRENATAL VITAMIN PO) Take by mouth.    . hydroxyprogesterone caproate (MAKENA) 250 mg/mL OIL injection Inject 1 mL (250 mg total) into the muscle once for 1 dose. For weekly injection in office from 16 weeks to 36 weeks of pregnancy. 5 mL 4   No current facility-administered medications for this visit.     Review of Systems Review of Systems  All other systems reviewed and are negative. Are as per the history of present illness  Blood pressure 100/68, pulse 92, temperature 97.9 F (36.6 C), temperature source Temporal, height 5\' 3"  (1.6 m),  weight 180 lb (81.6 kg), last menstrual period 10/31/2018, SpO2 98 %, unknown if currently breastfeeding.  Physical Exam Physical Exam Constitutional:      General: She is not in acute distress.    Appearance: Normal appearance.  HENT:     Head: Normocephalic and atraumatic.     Nose: Nose normal.     Mouth/Throat:     Mouth: Mucous membranes are moist.     Pharynx: No oropharyngeal exudate or posterior oropharyngeal erythema.  Eyes:     General: No scleral icterus.       Right eye: No discharge.        Left eye: No discharge.     Conjunctiva/sclera: Conjunctivae normal.     Comments: Wearing glasses  Neck:     Musculoskeletal: Normal range of motion. No neck rigidity.     Comments: No thyromegaly or palpable thyroid masses appreciated. Cardiovascular:     Rate and Rhythm: Normal rate and regular rhythm.     Pulses: Normal pulses.  Pulmonary:     Effort: Pulmonary effort is normal.     Breath sounds: Normal breath sounds.  Abdominal:     General: Bowel sounds are normal.     Palpations: Abdomen is soft.     Comments: Gravid uterus present.  On visual inspection, there does appear to be an umbilical hernia, however she is so sensitive to touch that I am unable to examine the area thoroughly.  She has an infraumbilical incision from her recent cholecystectomy.  Other surgical scars consistent with that history.  Genitourinary:    Comments: Deferred Musculoskeletal:        General: No swelling.  Lymphadenopathy:     Cervical: No cervical adenopathy.  Skin:    General: Skin is warm and dry.  Neurological:     General: No focal deficit present.     Mental Status: She is alert and oriented to person, place, and time.  Psychiatric:        Behavior: Behavior normal.     Data Reviewed There are no relevant data to review.  Assessment This is a 23 year old woman who states that she was told recently that she had an umbilical hernia as a child.  She has exquisite sensitivity  in the area of her navel.  She has had worsening pain with her current pregnancy.  She states that with her prior pregnancy, she also had some discomfort, but not to this extent.  On physical examination, given the limits of my ability to examine the area, I do believe she likely has a small umbilical hernia.  There does not appear to be any bowel involvement.  Plan As Molly Bray is [redacted] weeks pregnant, I think it is ill advised to pursue  any surgical intervention that is not absolutely warranted for her safety or the safety of the fetus.  In addition, a hernia repair would almost certainly fail as her pregnancy progresses.  Certainly, if there were bowel incarceration or strangulation, this would present a surgical emergency, however in light of the tiny defect that I believe is present, this seems unlikely.  She was provided with warning signs to be aware of and for which she should contact surgery or present to the emergency department.  She states that this is to be her last pregnancy and that she would like to have her hernia repaired once the child is delivered.  She will contact us at that point and we will schedule a follow-up visit.    Duanne GuessJennifer Jayda White 03/03/2019, 2:04 PM

## 2019-03-03 NOTE — Patient Instructions (Addendum)
The patient is aware to call back for any questions or new concerns.   Umbilical Hernia, Adult  A hernia is a bulge of tissue that pushes through an opening between muscles. An umbilical hernia happens in the abdomen, near the belly button (umbilicus). The hernia may contain tissues from the small intestine, large intestine, or fatty tissue covering the intestines (omentum). Umbilical hernias in adults tend to get worse over time, and they require surgical treatment. There are several types of umbilical hernias. You may have:  A hernia located just above or below the umbilicus (indirect hernia). This is the most common type of umbilical hernia in adults.  A hernia that forms through an opening formed by the umbilicus (direct hernia).  A hernia that comes and goes (reducible hernia). A reducible hernia may be visible only when you strain, lift something heavy, or cough. This type of hernia can be pushed back into the abdomen (reduced).  A hernia that traps abdominal tissue inside the hernia (incarcerated hernia). This type of hernia cannot be reduced.  A hernia that cuts off blood flow to the tissues inside the hernia (strangulated hernia). The tissues can start to die if this happens. This type of hernia requires emergency treatment. What are the causes? An umbilical hernia happens when tissue inside the abdomen presses on a weak area of the abdominal muscles. What increases the risk? You may have a greater risk of this condition if you:  Are obese.  Have had several pregnancies.  Have a buildup of fluid inside your abdomen (ascites).  Have had surgery that weakens the abdominal muscles. What are the signs or symptoms? The main symptom of this condition is a painless bulge at or near the belly button. A reducible hernia may be visible only when you strain, lift something heavy, or cough. Other symptoms may include:  Dull pain.  A feeling of pressure. Symptoms of a strangulated  hernia may include:  Pain that gets increasingly worse.  Nausea and vomiting.  Pain when pressing on the hernia.  Skin over the hernia becoming red or purple.  Constipation.  Blood in the stool. How is this diagnosed? This condition may be diagnosed based on:  A physical exam. You may be asked to cough or strain while standing. These actions increase the pressure inside your abdomen and force the hernia through the opening in your muscles. Your health care provider may try to reduce the hernia by pressing on it.  Your symptoms and medical history. How is this treated? Surgery is the only treatment for an umbilical hernia. Surgery for a strangulated hernia is done as soon as possible. If you have a small hernia that is not incarcerated, you may need to lose weight before having surgery. Follow these instructions at home:  Lose weight, if told by your health care provider.  Do not try to push the hernia back in.  Watch your hernia for any changes in color or size. Tell your health care provider if any changes occur.  You may need to avoid activities that increase pressure on your hernia.  Do not lift anything that is heavier than 10 lb (4.5 kg) until your health care provider says that this is safe.  Take over-the-counter and prescription medicines only as told by your health care provider.  Keep all follow-up visits as told by your health care provider. This is important. Contact a health care provider if:  Your hernia gets larger.  Your hernia becomes painful. Get help   right away if:  You develop sudden, severe pain near the area of your hernia.  You have pain as well as nausea or vomiting.  You have pain and the skin over your hernia changes color.  You develop a fever. This information is not intended to replace advice given to you by your health care provider. Make sure you discuss any questions you have with your health care provider. Document Released:  12/02/2015 Document Revised: 08/14/2017 Document Reviewed: 12/31/2016 Elsevier Patient Education  2020 [employer]  

## 2019-03-04 ENCOUNTER — Ambulatory Visit (INDEPENDENT_AMBULATORY_CARE_PROVIDER_SITE_OTHER): Payer: Medicaid Other | Admitting: Obstetrics & Gynecology

## 2019-03-04 ENCOUNTER — Encounter: Payer: Self-pay | Admitting: Obstetrics & Gynecology

## 2019-03-04 VITALS — BP 110/70 | Wt 185.0 lb

## 2019-03-04 DIAGNOSIS — O09212 Supervision of pregnancy with history of pre-term labor, second trimester: Secondary | ICD-10-CM

## 2019-03-04 DIAGNOSIS — Z3A18 18 weeks gestation of pregnancy: Secondary | ICD-10-CM | POA: Diagnosis not present

## 2019-03-04 DIAGNOSIS — Z8751 Personal history of pre-term labor: Secondary | ICD-10-CM

## 2019-03-04 LAB — POCT URINALYSIS DIPSTICK OB
Glucose, UA: NEGATIVE
POC,PROTEIN,UA: NEGATIVE

## 2019-03-04 MED ORDER — HYDROXYPROGESTERONE CAPROATE 250 MG/ML IM OIL
250.0000 mg | TOPICAL_OIL | Freq: Once | INTRAMUSCULAR | Status: AC
  Administered 2019-03-04: 250 mg via INTRAMUSCULAR

## 2019-03-04 NOTE — Progress Notes (Signed)
  Subjective  Fetal Movement? no Contractions? no Leaking Fluid? no Vaginal Bleeding? Yes- spotting 2 days ago, this has been off and on this pregnancy  Objective  BP 110/70   Wt 185 lb (83.9 kg)   LMP 10/31/2018   BMI 32.77 kg/m  General: NAD Pumonary: no increased work of breathing Abdomen: gravid, non-tender Extremities: no edema Psychiatric: mood appropriate, affect full FHT 150s Assessment  23 y.o. [id number] at [redacted]w[redacted]d by  08/02/2019, by Ultrasound presenting for routine prenatal visit  Plan   Problem List Items Addressed This Visit    None    Visit Diagnoses    [redacted] weeks gestation of pregnancy    -  Primary   Relevant Orders   POC Urinalysis Dipstick OB (Completed)   History of preterm labor        Despite spotting, she has done well with no signs of miscarriage [place] 2 weeks  pregnancy5 Problems (from 10/31/18 to present)    Problem Noted Resolved   Supervision of high risk pregnancy, antepartum 12/25/2018 by Rexene Agent, CNM No   Overview Addendum 01/26/2019 11:47 AM by Gae Dry, MD    [facility] Prenatal Labs  Dating [place] Blood type: O/Positive/-- (05/27 1511)   Genetic Screen NIPS: nml XY Antibody:Negative (05/27 1511)  Anatomic [place] 19 wks Rubella: 1.30 (05/27 1511) Varicella: Immune  GTT Third trimester:  RPR: Non Reactive (05/27 1511)   Rhogam n/a HBsAg: Negative (05/27 1511)   TDaP vaccine              Flu Shot: HIV: Non Reactive (05/27 1511)   Baby Food      breast                          GBS:   Contraception      unsure Pap: 11/2018 ASCUS, repeat pp  CBB  no   CS/VBAC n/a   Support Person husband    17OHP for PTL prevention             Barnett Applebaum, MD, [facility], [facility] 03/04/2019  9:55 AM

## 2019-03-05 ENCOUNTER — Emergency Department: Payer: Medicaid Other

## 2019-03-05 ENCOUNTER — Other Ambulatory Visit: Payer: Self-pay

## 2019-03-05 ENCOUNTER — Emergency Department
Admission: EM | Admit: 2019-03-05 | Discharge: 2019-03-05 | Disposition: A | Discharge status: Home / Self Care | Payer: Medicaid Other | Attending: Emergency Medicine | Admitting: Emergency Medicine

## 2019-03-05 DIAGNOSIS — Z5321 Procedure and treatment not carried out due to patient leaving prior to being seen by health care provider: Secondary | ICD-10-CM | POA: Diagnosis not present

## 2019-03-05 DIAGNOSIS — R109 Unspecified abdominal pain: Secondary | ICD-10-CM | POA: Insufficient documentation

## 2019-03-05 DIAGNOSIS — T1490XA Injury, unspecified, initial encounter: Secondary | ICD-10-CM

## 2019-03-05 LAB — CBC
HCT: 38.4 % (ref 36.0–46.0)
Hemoglobin: 13.4 g/dL (ref 12.0–15.0)
MCH: 30.9 pg (ref 26.0–34.0)
MCHC: 34.9 g/dL (ref 30.0–36.0)
MCV: 88.5 fL (ref 80.0–100.0)
Platelets: 302 10*3/uL (ref 150–400)
RBC: 4.34 MIL/uL (ref 3.87–5.11)
RDW: 13.2 % (ref 11.5–15.5)
WBC: 13 10*3/uL — ABNORMAL HIGH (ref 4.0–10.5)
nRBC: 0 % (ref 0.0–0.2)

## 2019-03-05 LAB — URINALYSIS, COMPLETE (UACMP) WITH MICROSCOPIC
Bilirubin Urine: NEGATIVE
Glucose, UA: NEGATIVE mg/dL
Hgb urine dipstick: NEGATIVE
Ketones, ur: NEGATIVE mg/dL
Nitrite: NEGATIVE
Protein, ur: 30 mg/dL — AB
Specific Gravity, Urine: 1.025 (ref 1.005–1.030)
Squamous Epithelial / HPF: >50 — ABNORMAL HIGH (ref 0–5)
pH: 5 (ref 5.0–8.0)

## 2019-03-05 LAB — COMPREHENSIVE METABOLIC PANEL
ALT: 11 U/L (ref 0–44)
AST: 15 U/L (ref 15–41)
Albumin: 3.5 g/dL (ref 3.5–5.0)
Alkaline Phosphatase: 80 U/L (ref 38–126)
Anion gap: 6 (ref 5–15)
BUN: 6 mg/dL (ref 6–20)
CO2: 21 mmol/L — ABNORMAL LOW (ref 22–32)
Calcium: 9 mg/dL (ref 8.9–10.3)
Chloride: 108 mmol/L (ref 98–111)
Creatinine, Ser: 0.5 mg/dL (ref 0.44–1.00)
GFR calc Af Amer: >60 mL/min (ref >60)
GFR calc non Af Amer: >60 mL/min (ref >60)
Glucose, Bld: 95 mg/dL (ref 70–99)
Potassium: 3.6 mmol/L (ref 3.5–5.1)
Sodium: 135 mmol/L (ref 135–145)
Total Bilirubin: 0.3 mg/dL (ref 0.3–1.2)
Total Protein: 7 g/dL (ref 6.5–8.1)

## 2019-03-05 LAB — HCG, QUANTITATIVE, PREGNANCY: hCG, Beta Chain, Quant, S: 20349 m[IU]/mL — ABNORMAL HIGH (ref <5)

## 2019-03-05 LAB — LIPASE, BLOOD: Lipase: 20 U/L (ref 11–51)

## 2019-03-05 NOTE — ED Triage Notes (Signed)
Pt presents to ED with lower abd pain after she "fell through the floor" of her home 45 min ago. Pt states fell past her abd into the underside of her home. Pt is currently [redacted] weeks pregnant. Pt states approx 10 min after her fall she was incontinent of urine and her lower abd and lower back started to hurt. Denies bleeding. Pt does report that her "underpants feel wet again".

## 2019-03-05 NOTE — ED Notes (Signed)
Pt states she is leaving and going to [facility] ED.

## 2019-03-09 ENCOUNTER — Ambulatory Visit (INDEPENDENT_AMBULATORY_CARE_PROVIDER_SITE_OTHER): Payer: Medicaid Other

## 2019-03-09 ENCOUNTER — Other Ambulatory Visit: Payer: Self-pay

## 2019-03-09 DIAGNOSIS — O09899 Supervision of other high risk pregnancies, unspecified trimester: Secondary | ICD-10-CM

## 2019-03-09 DIAGNOSIS — O09212 Supervision of pregnancy with history of pre-term labor, second trimester: Secondary | ICD-10-CM | POA: Diagnosis not present

## 2019-03-09 DIAGNOSIS — Z3A19 19 weeks gestation of pregnancy: Secondary | ICD-10-CM

## 2019-03-09 MED ORDER — HYDROXYPROGESTERONE CAPROATE 250 MG/ML IM OIL
250.0000 mg | TOPICAL_OIL | Freq: Once | INTRAMUSCULAR | Status: AC
  Administered 2019-03-09: 09:00:00 250 mg via INTRAMUSCULAR

## 2019-03-09 NOTE — Progress Notes (Signed)
Patient presents today for Hydroxyprogesterone injection within dates. Given IM LUOQ. Patient tolerated well. °

## 2019-03-10 ENCOUNTER — Ambulatory Visit: Payer: Medicaid Other | Admitting: General Surgery

## 2019-03-16 ENCOUNTER — Ambulatory Visit (INDEPENDENT_AMBULATORY_CARE_PROVIDER_SITE_OTHER): Payer: Medicaid Other

## 2019-03-16 ENCOUNTER — Other Ambulatory Visit: Payer: Self-pay

## 2019-03-16 ENCOUNTER — Other Ambulatory Visit: Payer: Self-pay | Admitting: Obstetrics & Gynecology

## 2019-03-16 DIAGNOSIS — Z3A20 20 weeks gestation of pregnancy: Secondary | ICD-10-CM

## 2019-03-16 DIAGNOSIS — O09899 Supervision of other high risk pregnancies, unspecified trimester: Secondary | ICD-10-CM

## 2019-03-16 DIAGNOSIS — O09212 Supervision of pregnancy with history of pre-term labor, second trimester: Secondary | ICD-10-CM

## 2019-03-16 DIAGNOSIS — Z3689 Encounter for other specified antenatal screening: Secondary | ICD-10-CM

## 2019-03-16 MED ORDER — HYDROXYPROGESTERONE CAPROATE 250 MG/ML IM OIL
250.0000 mg | TOPICAL_OIL | Freq: Once | INTRAMUSCULAR | Status: AC
  Administered 2019-03-16: 250 mg via INTRAMUSCULAR

## 2019-03-18 ENCOUNTER — Ambulatory Visit (INDEPENDENT_AMBULATORY_CARE_PROVIDER_SITE_OTHER): Payer: Medicaid Other | Admitting: Obstetrics & Gynecology

## 2019-03-18 ENCOUNTER — Ambulatory Visit (INDEPENDENT_AMBULATORY_CARE_PROVIDER_SITE_OTHER): Payer: Medicaid Other

## 2019-03-18 ENCOUNTER — Other Ambulatory Visit: Payer: Self-pay

## 2019-03-18 ENCOUNTER — Encounter: Payer: Self-pay | Admitting: Obstetrics & Gynecology

## 2019-03-18 VITALS — BP 100/60 | Wt 181.0 lb

## 2019-03-18 DIAGNOSIS — Z3A21 21 weeks gestation of pregnancy: Secondary | ICD-10-CM

## 2019-03-18 DIAGNOSIS — Z363 Encounter for antenatal screening for malformations: Secondary | ICD-10-CM

## 2019-03-18 DIAGNOSIS — O0990 Supervision of high risk pregnancy, unspecified, unspecified trimester: Secondary | ICD-10-CM

## 2019-03-18 DIAGNOSIS — O0992 Supervision of high risk pregnancy, unspecified, second trimester: Secondary | ICD-10-CM

## 2019-03-18 DIAGNOSIS — Z3689 Encounter for other specified antenatal screening: Secondary | ICD-10-CM

## 2019-03-18 NOTE — Patient Instructions (Signed)

## 2019-03-18 NOTE — Progress Notes (Signed)
  Subjective  Fetal Movement? yes Contractions? no Leaking Fluid? no Vaginal Bleeding? no H/o PTL, on Makena Objective  BP 100/60   Wt 181 lb (82.1 kg)   LMP 10/31/2018   BMI 32.06 kg/m  General: NAD Pumonary: no increased work of breathing Abdomen: gravid, non-tender Extremities: no edema Psychiatric: mood appropriate, affect full  Assessment  23 y.o. [id number] at [redacted]w[redacted]d by  07/27/2019, Alternate EDD Entry presenting for routine prenatal visit  Plan   Problem List Items Addressed This Visit      Other   Supervision of high risk pregnancy, antepartum    Other Visit Diagnoses    [redacted] weeks gestation of pregnancy    -  Primary      pregnancy5 Problems (from 10/31/18 to present)    Problem Noted Resolved   Supervision of high risk pregnancy, antepartum  No   Overview Addendum 01/26/2019 11:47 AM by Gae Dry, MD    [facility] Prenatal Labs  Dating [place] Blood type: O/Positive/-- (05/27 1511)   Genetic Screen NIPS: nml XY Antibody:Negative (05/27 1511)  Anatomic [place] 19 wks Rubella: 1.30 (05/27 1511) Varicella: Immune  GTT Third trimester:  RPR: Non Reactive (05/27 1511)   Rhogam n/a HBsAg: Negative (05/27 1511)   TDaP vaccine              Flu Shot: HIV: Non Reactive (05/27 1511)   Baby Food      breast                          GBS:   Contraception      unsure Pap: 11/2018 ASCUS, repeat pp  CBB  no   CS/VBAC n/a   Support Person husband    17OHP for PTL prevention           Makena weekly  Review of ULTRASOUND. I have personally reviewed images and report of recent ultrasound done at [facility]. There is a singleton gestation with subjectively normal amniotic fluid volume. The fetal biometry correlates with established dating. Detailed evaluation of the fetal anatomy was performed.The fetal anatomical survey appears within normal limits within the resolution of ultrasound as described above.  It must be noted that a normal ultrasound is unable to rule out fetal  aneuploidy.    Barnett Applebaum, MD, [facility], [facility] 03/18/2019  10:12 AM

## 2019-03-24 ENCOUNTER — Other Ambulatory Visit: Payer: Self-pay

## 2019-03-24 ENCOUNTER — Ambulatory Visit (INDEPENDENT_AMBULATORY_CARE_PROVIDER_SITE_OTHER): Payer: Medicaid Other

## 2019-03-24 DIAGNOSIS — Z3A22 22 weeks gestation of pregnancy: Secondary | ICD-10-CM | POA: Diagnosis not present

## 2019-03-24 DIAGNOSIS — O09899 Supervision of other high risk pregnancies, unspecified trimester: Secondary | ICD-10-CM

## 2019-03-24 DIAGNOSIS — O09212 Supervision of pregnancy with history of pre-term labor, second trimester: Secondary | ICD-10-CM

## 2019-03-24 MED ORDER — HYDROXYPROGESTERONE CAPROATE 250 MG/ML IM OIL
250.0000 mg | TOPICAL_OIL | Freq: Once | INTRAMUSCULAR | Status: AC
  Administered 2019-03-24: 250 mg via INTRAMUSCULAR

## 2019-03-24 NOTE — Progress Notes (Signed)
Pt here for her hydroxyprogesterone inj which was given IM right glut.  [facility] # [id number]

## 2019-04-01 ENCOUNTER — Encounter: Payer: Medicaid Other | Admitting: Obstetrics & Gynecology

## 2019-04-02 ENCOUNTER — Ambulatory Visit (INDEPENDENT_AMBULATORY_CARE_PROVIDER_SITE_OTHER): Payer: Medicaid Other

## 2019-04-02 ENCOUNTER — Other Ambulatory Visit: Payer: Self-pay

## 2019-04-02 DIAGNOSIS — Z3A23 23 weeks gestation of pregnancy: Secondary | ICD-10-CM | POA: Diagnosis not present

## 2019-04-02 DIAGNOSIS — O09212 Supervision of pregnancy with history of pre-term labor, second trimester: Secondary | ICD-10-CM

## 2019-04-02 DIAGNOSIS — O09899 Supervision of other high risk pregnancies, unspecified trimester: Secondary | ICD-10-CM

## 2019-04-02 MED ORDER — HYDROXYPROGESTERONE CAPROATE 250 MG/ML IM OIL
250.0000 mg | TOPICAL_OIL | Freq: Once | INTRAMUSCULAR | Status: AC
  Administered 2019-04-02: 250 mg via INTRAMUSCULAR

## 2019-04-02 NOTE — Progress Notes (Signed)
Pt here for 17P injection, Given LUOQ. Tolerated well

## 2019-04-06 ENCOUNTER — Ambulatory Visit (INDEPENDENT_AMBULATORY_CARE_PROVIDER_SITE_OTHER): Payer: Medicaid Other | Admitting: Obstetrics & Gynecology

## 2019-04-06 ENCOUNTER — Encounter: Payer: Self-pay | Admitting: Obstetrics & Gynecology

## 2019-04-06 ENCOUNTER — Other Ambulatory Visit: Payer: Self-pay

## 2019-04-06 VITALS — BP 100/60 | Wt 187.0 lb

## 2019-04-06 DIAGNOSIS — O09212 Supervision of pregnancy with history of pre-term labor, second trimester: Secondary | ICD-10-CM

## 2019-04-06 DIAGNOSIS — Z3A24 24 weeks gestation of pregnancy: Secondary | ICD-10-CM

## 2019-04-06 DIAGNOSIS — O0990 Supervision of high risk pregnancy, unspecified, unspecified trimester: Secondary | ICD-10-CM

## 2019-04-06 DIAGNOSIS — O0992 Supervision of high risk pregnancy, unspecified, second trimester: Secondary | ICD-10-CM

## 2019-04-06 DIAGNOSIS — O09899 Supervision of other high risk pregnancies, unspecified trimester: Secondary | ICD-10-CM

## 2019-04-06 LAB — POCT URINALYSIS DIPSTICK OB
Glucose, UA: NEGATIVE
POC,PROTEIN,UA: NEGATIVE

## 2019-04-06 MED ORDER — ESOMEPRAZOLE MAGNESIUM 20 MG PO CPDR: 20.0000 mg | DELAYED_RELEASE_CAPSULE | Freq: Every day | ORAL | 11 refills | Status: DC

## 2019-04-06 NOTE — Progress Notes (Signed)
  Subjective  Fetal Movement? yes Contractions? no Leaking Fluid? no Vaginal Bleeding? no Makena weekly, no complaints Objective  BP 100/60   Wt 187 lb (84.8 kg)   LMP 10/31/2018   BMI 33.13 kg/m  General: NAD Pumonary: no increased work of breathing Abdomen: gravid, non-tender Extremities: no edema Psychiatric: mood appropriate, affect full  Assessment  23 y.o. [id number] at [redacted]w[redacted]d by  07/27/2019, Alternate EDD Entry presenting for routine prenatal visit  Plan   Problem List Items Addressed This Visit    Supervision of high risk pregnancy, antepartum   [redacted] weeks gestation of pregnancy       Relevant Orders   POC Urinalysis Dipstick OB (Completed)   History of preterm delivery, currently pregnant        Nexium for GERD PNV Makena weekly   pregnancy5 Problems (from 10/31/18 to present)    Problem Noted Resolved   Supervision of high risk pregnancy, antepartum  No   Overview Addendum 01/26/2019 11:47 AM by Gae Dry, MD    [facility] Prenatal Labs  Dating [place] Blood type: O/Positive/-- (05/27 1511)   Genetic Screen NIPS: nml XY Antibody:Negative (05/27 1511)  Anatomic [place] 19 wks Rubella: 1.30 (05/27 1511) Varicella: Immune  GTT Third trimester:  RPR: Non Reactive (05/27 1511)   Rhogam n/a HBsAg: Negative (05/27 1511)   TDaP vaccine              Flu Shot: HIV: Non Reactive (05/27 1511)   Baby Food      breast                          GBS:   Contraception      unsure Pap: 11/2018 ASCUS, repeat pp  CBB  no   CS/VBAC n/a   Support Person husband    17OHP for PTL prevention             Barnett Applebaum, MD, [facility], [facility] 04/06/2019  11:19 AM

## 2019-04-06 NOTE — Patient Instructions (Signed)
 COVID-19 and Your Pregnancy FAQ  How can I prevent infection with COVID-19 during my pregnancy? Social distancing is key. Please limit any interactions in public. Try and work from home if possible. Frequently wash your hands after touching possibly contaminated surfaces. Avoid touching your face.  Minimize trips to the store. Consider online ordering when possible.   Should I wear a mask? YES. It is recommended by the CDC that all people wear a cloth mask or facial covering in public. You should wear a mask to your visits in the office. This will help reduce transmission as well as your risk or acquiring COVID-19. New studies are showing that even asymptomatic individuals can spread the virus from talking.   Where can I get a mask? [place] and the city of [place] are partnering to provide masks to community members. You can pick up a mask from several locations. This website also has instructions about how to make a mask by sewing or without sewing by using a t-shirt or bandana.  https://www.[place].gov/i-want-to/learn-about/covid-19-information-and-updates/covid-19-face-mask-project  Studies have shown that if you were a tube or nylon stocking from pantyhose over a cloth mask it makes the cloth mask almost as effective as a N95 mask.  https://www.npr.org/sections/goatsandsoda/2018/11/05/840146830/adding-a-nylon-stocking-layer-could-boost-protection-from-cloth-masks-study-find  What are the symptoms of COVID-19? Fever (greater than 100.4 F), dry cough, shortness of breath.  Am I more at risk for COVID-19 since I am pregnant? There is not currently data showing that pregnant women are more adversely impacted by COVID-19 than the general population. However, we know that pregnant women tend to have worse respiratory complications from similar diseases such as the flu and SARS and for this reason should be considered an at-risk population.  What do I do if I am experiencing the  symptoms of COVID-19? Testing is being limited because of test availability. If you are experiencing symptoms you should quarantine yourself, and the members of your family, for at least 2 weeks at home.   Please visit this website for more information: https://www.cdc.gov/coronavirus/2019-ncov/if-you-are-sick/steps-when-sick.html  When should I go to the Emergency Room? Please go to the emergency room if you are experiencing ANY of these symptoms*:  1.    Difficulty breathing or shortness of breath 2.    Persistent pain or pressure in the chest 3.    Confusion or difficulty being aroused (or awakened) 4.    Bluish lips or face  *This list is not all inclusive. Please consult our office for any other symptoms that are severe or concerning.  What do I do if I am having difficulty breathing? You should go to the Emergency Room for evaluation. At this time they have a tent set up for evaluating patients with COVID-19 symptoms.   How will my prenatal care be different because of the COVID-19 pandemic? It has been recommended to reduce the frequency of face-to-face visits and use resources such as telephone and virtual visits when possible. Using a scale, blood pressure machine and fetal doppler at home can further help reduce face-to-face visits. You will be provided with additional information on this topic.  We ask that you come to your visits alone to minimize potential exposures to  COVID-19.  How can I receive childbirth education? At this time in-person classes have been cancelled. You can register for online childbirth education, breastfeeding, and newborn care classes.  Please visit:  [email or web address] for more information  How will my hospital birth experience be different? The hospital is currently limiting visitors. This means that while you are in   labor you can only have one person at the hospital with you. Additional family members will not be allowed to wait in the  building or outside your room. Your one support person can be the father of the baby, a relative, a doula, or a friend. Once one support person is designated that person will wear a band. This band cannot be shared with multiple people.  Nitrous Gas is not being offered for pain relief since the tubing and filter for the machine can not be sanitized in a way to guarantee prevention of transmission of COVID-19.  Nasal cannula use of oxygen for fetal indications has also been discontinued.  Currently a clear plastic sheet is being hung between mom and the delivering provider during pushing and delivery to help prevent transmission of COVID-19.      How long will I stay in the hospital for after giving birth? It is also recommended that discharge home be expedited during the COVID-19 outbreak. This means staying for 1 day after a vaginal delivery and 2 days after a cesarean section. Patients who need to stay longer for medical reasons are allowed to do so, but the goal will be for expedited discharge home.   What if I have COVID-19 and I am in labor? We ask that you wear a mask while on labor and delivery. We will try and accommodate you being placed in a room that is capable of filtering the air. Please call ahead if you are in labor and on your way to the hospital. The phone number for labor and delivery at  [facility] is [phone].  If I have COVID-19 when my baby is born how can I prevent my baby from contracting COVID-19? This is an issue that will have to be discussed on a case-by-case basis. Current recommendations suggest providing separate isolation rooms for both the mother and new infant as well as limiting visitors. However, there are practical challenges to this recommendation. The situation will assuredly change and decisions will be influenced by the desires of the mother and availability of space.  Some suggestions are the use of a curtain or physical barrier  between mom and infant, hand hygiene, mom wearing a mask, or 6 feet of spacing between a mom and infant.   Can I breastfeed during the COVID-19 pandemic?   Yes, breastfeeding is encouraged.  Can I breastfeed if I have COVID-19? Yes. Covid-19 has not been found in breast milk. This means you cannot give COVID-19 to your child through breast milk. Breast feeding will also help pass antibodies to fight infection to your baby.   What precautions should I take when breastfeeding if I have COVID-19? If a mother and newborn do room-in and the mother wishes to feed at the breast, she should put on a facemask and practice hand hygiene before each feeding.  What precautions should I take when pumping if I have COVID-19? Prior to expressing breast milk, mothers should practice hand hygiene. After each pumping session, all parts that come into contact with breast milk should be thoroughly washed and the entire pump should be appropriately disinfected per the manufacturer's instructions. This expressed breast milk should be fed to the newborn by a healthy caregiver.  What if I am pregnant and work in healthcare? Based on limited data regarding COVID-19 and pregnancy, ACOG currently does not propose creating additional restrictions on pregnant health care personnel because of COVID-19 alone. Pregnant women do not appear to be at higher   risk of severe disease related to COVID-19. Pregnant health care personnel should follow CDC risk assessment and infection control guidelines for health care personnel exposed to patients with suspected or confirmed COVID-19. Adherence to recommended infection prevention and control practices is an important part of protecting all health care personnel in health care settings.    Information on COVID-19 in pregnancy is very limited; however, facilities may want to consider limiting exposure of pregnant health care personnel to patients with confirmed or suspected COVID-19  infection, especially during higher-risk procedures (eg, aerosol-generating procedures), if feasible, based on staffing availability.    

## 2019-04-09 ENCOUNTER — Other Ambulatory Visit: Payer: Self-pay

## 2019-04-09 ENCOUNTER — Ambulatory Visit (INDEPENDENT_AMBULATORY_CARE_PROVIDER_SITE_OTHER): Payer: Medicaid Other

## 2019-04-09 DIAGNOSIS — Z3A25 25 weeks gestation of pregnancy: Secondary | ICD-10-CM | POA: Diagnosis not present

## 2019-04-09 DIAGNOSIS — O09292 Supervision of pregnancy with other poor reproductive or obstetric history, second trimester: Secondary | ICD-10-CM | POA: Diagnosis not present

## 2019-04-09 DIAGNOSIS — Z8751 Personal history of pre-term labor: Secondary | ICD-10-CM

## 2019-04-09 MED ORDER — HYDROXYPROGESTERONE CAPROATE 250 MG/ML IM OIL
250.0000 mg | TOPICAL_OIL | Freq: Once | INTRAMUSCULAR | Status: AC
  Administered 2019-04-09: 250 mg via INTRAMUSCULAR

## 2019-04-16 ENCOUNTER — Ambulatory Visit (INDEPENDENT_AMBULATORY_CARE_PROVIDER_SITE_OTHER): Payer: Medicaid Other

## 2019-04-16 ENCOUNTER — Other Ambulatory Visit: Payer: Self-pay

## 2019-04-16 DIAGNOSIS — Z3A26 26 weeks gestation of pregnancy: Secondary | ICD-10-CM

## 2019-04-16 DIAGNOSIS — O09212 Supervision of pregnancy with history of pre-term labor, second trimester: Secondary | ICD-10-CM

## 2019-04-16 DIAGNOSIS — Z8751 Personal history of pre-term labor: Secondary | ICD-10-CM

## 2019-04-16 MED ORDER — HYDROXYPROGESTERONE CAPROATE 250 MG/ML IM OIL
250.0000 mg | TOPICAL_OIL | Freq: Once | INTRAMUSCULAR | Status: AC
  Administered 2019-04-16: 250 mg via INTRAMUSCULAR

## 2019-04-16 NOTE — Progress Notes (Signed)
Pt here for hydroxyprogesterone inj which was given IM right glut.  NDC# [address]-1. °

## 2019-04-20 ENCOUNTER — Ambulatory Visit (INDEPENDENT_AMBULATORY_CARE_PROVIDER_SITE_OTHER): Payer: Medicaid Other | Admitting: Obstetrics & Gynecology

## 2019-04-20 ENCOUNTER — Other Ambulatory Visit: Payer: Self-pay

## 2019-04-20 ENCOUNTER — Encounter: Payer: Self-pay | Admitting: Obstetrics & Gynecology

## 2019-04-20 VITALS — BP 120/70 | Wt 189.0 lb

## 2019-04-20 DIAGNOSIS — O09899 Supervision of other high risk pregnancies, unspecified trimester: Secondary | ICD-10-CM

## 2019-04-20 DIAGNOSIS — Z131 Encounter for screening for diabetes mellitus: Secondary | ICD-10-CM

## 2019-04-20 DIAGNOSIS — O0990 Supervision of high risk pregnancy, unspecified, unspecified trimester: Secondary | ICD-10-CM

## 2019-04-20 DIAGNOSIS — Z3A26 26 weeks gestation of pregnancy: Secondary | ICD-10-CM

## 2019-04-20 DIAGNOSIS — O09892 Supervision of other high risk pregnancies, second trimester: Secondary | ICD-10-CM

## 2019-04-20 DIAGNOSIS — O0992 Supervision of high risk pregnancy, unspecified, second trimester: Secondary | ICD-10-CM

## 2019-04-20 NOTE — Progress Notes (Signed)
  Subjective  Fetal Movement? yes Contractions? no Leaking Fluid? no Vaginal Bleeding? no  Objective  BP 120/70   Wt 189 lb (85.7 kg)   LMP 10/31/2018   BMI 33.48 kg/m  General: NAD Pumonary: no increased work of breathing Abdomen: gravid, non-tender Extremities: no edema Psychiatric: mood appropriate, affect full  Assessment  23 y.o. [id number] at [redacted]w[redacted]d by  07/27/2019, Alternate EDD Entry presenting for routine prenatal visit  Plan   Problem List Items Addressed This Visit      Other   Supervision of high risk pregnancy, antepartum    Other Visit Diagnoses    [redacted] weeks gestation of pregnancy    -  Primary   History of preterm delivery, currently pregnant       Screening for diabetes mellitus       Relevant Orders   28 Week RH+Panel      pregnancy5 Problems (from 10/31/18 to present)    Problem Noted Resolved   Supervision of high risk pregnancy, antepartum 12/25/2018 by Rexene Agent, CNM No   Overview Addendum 01/26/2019 11:47 AM by Gae Dry, MD    [facility] Prenatal Labs  Dating [place] Blood type: O/Positive/-- (05/27 1511)   Genetic Screen NIPS: nml XY Antibody:Negative (05/27 1511)  Anatomic [place] 19 wks Rubella: 1.30 (05/27 1511) Varicella: Immune  GTT Third trimester:  RPR: Non Reactive (05/27 1511)   Rhogam n/a HBsAg: Negative (05/27 1511)   TDaP vaccine              Flu Shot: HIV: Non Reactive (05/27 1511)   Baby Food      breast                          GBS:   Contraception      unsure Pap: 11/2018 ASCUS, repeat pp  CBB  no   CS/VBAC n/a   Support Person husband    17OHP for PTL prevention           Makena weekly  Glucola 2 weeks  Barnett Applebaum, MD, [facility], [facility] 04/20/2019  11:47 AM

## 2019-04-23 ENCOUNTER — Ambulatory Visit (INDEPENDENT_AMBULATORY_CARE_PROVIDER_SITE_OTHER): Payer: Medicaid Other | Admitting: Obstetrics & Gynecology

## 2019-04-23 ENCOUNTER — Observation Stay
Admission: EM | Admit: 2019-04-23 | Discharge: 2019-04-23 | Disposition: A | Discharge status: Home / Self Care | Payer: Medicaid Other | Attending: Obstetrics and Gynecology | Admitting: Obstetrics and Gynecology

## 2019-04-23 ENCOUNTER — Observation Stay: Payer: Medicaid Other

## 2019-04-23 ENCOUNTER — Ambulatory Visit (INDEPENDENT_AMBULATORY_CARE_PROVIDER_SITE_OTHER): Payer: Medicaid Other

## 2019-04-23 ENCOUNTER — Other Ambulatory Visit: Payer: Self-pay

## 2019-04-23 ENCOUNTER — Encounter: Payer: Self-pay | Admitting: Obstetrics & Gynecology

## 2019-04-23 VITALS — BP 128/70 | HR 117 | Wt 189.0 lb

## 2019-04-23 DIAGNOSIS — O09212 Supervision of pregnancy with history of pre-term labor, second trimester: Secondary | ICD-10-CM

## 2019-04-23 DIAGNOSIS — Z3A26 26 weeks gestation of pregnancy: Secondary | ICD-10-CM

## 2019-04-23 DIAGNOSIS — Z9049 Acquired absence of other specified parts of digestive tract: Secondary | ICD-10-CM | POA: Diagnosis not present

## 2019-04-23 DIAGNOSIS — F17210 Nicotine dependence, cigarettes, uncomplicated: Secondary | ICD-10-CM | POA: Diagnosis not present

## 2019-04-23 DIAGNOSIS — Z79899 Other long term (current) drug therapy: Secondary | ICD-10-CM | POA: Diagnosis not present

## 2019-04-23 DIAGNOSIS — O26892 Other specified pregnancy related conditions, second trimester: Principal | ICD-10-CM | POA: Insufficient documentation

## 2019-04-23 DIAGNOSIS — Z3A27 27 weeks gestation of pregnancy: Secondary | ICD-10-CM

## 2019-04-23 DIAGNOSIS — R1011 Right upper quadrant pain: Secondary | ICD-10-CM | POA: Insufficient documentation

## 2019-04-23 DIAGNOSIS — Z888 Allergy status to other drugs, medicaments and biological substances status: Secondary | ICD-10-CM | POA: Diagnosis not present

## 2019-04-23 DIAGNOSIS — O09899 Supervision of other high risk pregnancies, unspecified trimester: Secondary | ICD-10-CM

## 2019-04-23 DIAGNOSIS — Z880 Allergy status to penicillin: Secondary | ICD-10-CM | POA: Insufficient documentation

## 2019-04-23 DIAGNOSIS — O0990 Supervision of high risk pregnancy, unspecified, unspecified trimester: Secondary | ICD-10-CM

## 2019-04-23 DIAGNOSIS — K219 Gastro-esophageal reflux disease without esophagitis: Secondary | ICD-10-CM | POA: Insufficient documentation

## 2019-04-23 DIAGNOSIS — O1202 Gestational edema, second trimester: Secondary | ICD-10-CM

## 2019-04-23 LAB — POCT URINALYSIS DIPSTICK OB: Glucose, UA: NEGATIVE

## 2019-04-23 LAB — COMPREHENSIVE METABOLIC PANEL
ALT: 10 U/L (ref 0–44)
AST: 16 U/L (ref 15–41)
Albumin: 3.1 g/dL — ABNORMAL LOW (ref 3.5–5.0)
Alkaline Phosphatase: 84 U/L (ref 38–126)
Anion gap: 8 (ref 5–15)
BUN: 9 mg/dL (ref 6–20)
CO2: 23 mmol/L (ref 22–32)
Calcium: 9.4 mg/dL (ref 8.9–10.3)
Chloride: 105 mmol/L (ref 98–111)
Creatinine, Ser: 0.39 mg/dL — ABNORMAL LOW (ref 0.44–1.00)
GFR calc Af Amer: >60 mL/min (ref >60)
GFR calc non Af Amer: >60 mL/min (ref >60)
Glucose, Bld: 96 mg/dL (ref 70–99)
Potassium: 3.7 mmol/L (ref 3.5–5.1)
Sodium: 136 mmol/L (ref 135–145)
Total Bilirubin: 0.3 mg/dL (ref 0.3–1.2)
Total Protein: 6.3 g/dL — ABNORMAL LOW (ref 6.5–8.1)

## 2019-04-23 LAB — CBC
HCT: 33.6 % — ABNORMAL LOW (ref 36.0–46.0)
Hemoglobin: 11.4 g/dL — ABNORMAL LOW (ref 12.0–15.0)
MCH: 31 pg (ref 26.0–34.0)
MCHC: 33.9 g/dL (ref 30.0–36.0)
MCV: 91.3 fL (ref 80.0–100.0)
Platelets: 264 10*3/uL (ref 150–400)
RBC: 3.68 MIL/uL — ABNORMAL LOW (ref 3.87–5.11)
RDW: 13.4 % (ref 11.5–15.5)
WBC: 14.3 10*3/uL — ABNORMAL HIGH (ref 4.0–10.5)
nRBC: 0 % (ref 0.0–0.2)

## 2019-04-23 LAB — PROTEIN / CREATININE RATIO, URINE
Creatinine, Urine: 93 mg/dL
Protein Creatinine Ratio: 0.08 mg/mg{Cre} (ref 0.00–0.15)
Total Protein, Urine: 7 mg/dL

## 2019-04-23 LAB — TYPE AND SCREEN
ABO/RH(D): O POS
Antibody Screen: NEGATIVE

## 2019-04-23 MED ORDER — ACETAMINOPHEN 325 MG PO TABS: 650.0000 mg | ORAL_TABLET | ORAL | Status: DC | PRN

## 2019-04-23 MED ORDER — HYDROXYPROGESTERONE CAPROATE 250 MG/ML IM OIL
250.0000 mg | TOPICAL_OIL | Freq: Once | INTRAMUSCULAR | Status: AC
  Administered 2019-04-23: 250 mg via INTRAMUSCULAR

## 2019-04-23 MED ORDER — ONDANSETRON HCL 4 MG/2ML IJ SOLN: 4.0000 mg | Freq: Four times a day (QID) | INTRAMUSCULAR | Status: DC | PRN

## 2019-04-23 MED ORDER — LIDOCAINE HCL (PF) 1 % IJ SOLN: 30.0000 mL | INTRAMUSCULAR | Status: DC | PRN

## 2019-04-23 NOTE — Progress Notes (Signed)
   Gynecology Pelvic Pain Evaluation   Chief Complaint:  Chief Complaint  Patient presents with  . Edema  . Nausea  . Pelvic Pain    History of Present Illness:   Patient is a 23 y.o. [id number] who LMP was Patient's last menstrual period was 10/31/2018., presents today for a problem visit.  She complains of RUQ pain as well as edema, not feeling right, headache; no blurry vision.  Good FM.  No ctxs.  h/o PTL, on Makena. No recent h/o HTN.  PMHx: She  has a past medical history of Anxiety, Asthma, Chronic kidney disease, Depression, GERD (gastroesophageal reflux disease), Headache, History of methicillin resistant staphylococcus aureus (MRSA) (2008), IBS (irritable bowel syndrome), Pinched nerve, Pyelonephritis, and Recurrent UTI. Also,  has a past surgical history that includes Kidney surgery and Cholecystectomy (N/A, 03/07/2018)., family history includes COPD in her father and mother; Diabetes in her maternal aunt.,  reports that she has been smoking cigarettes. She has a 4.00 pack-year smoking history. She has never used smokeless tobacco. She reports that she does not drink alcohol or use drugs.  She has a current medication list which includes the following prescription(s): albuterol, esomeprazole, hydroxyprogesterone caproate, and prenatal vit-fe fumarate-fa. Also, is allergic to amoxicillin; vancomycin; penicillins; and silver sulfadiazine.  Review of Systems  Constitutional: Negative for chills, fever and malaise/fatigue.  HENT: Negative for congestion, sinus pain and sore throat.   Eyes: Negative for blurred vision and pain.  Respiratory: Negative for cough and wheezing.   Cardiovascular: Negative for chest pain and leg swelling.  Gastrointestinal: Negative for abdominal pain, constipation, diarrhea, heartburn, nausea and vomiting.  Genitourinary: Negative for dysuria, frequency, hematuria and urgency.  Musculoskeletal: Negative for back pain, joint pain, myalgias and neck pain.   Skin: Negative for itching and rash.  Neurological: Negative for dizziness, tremors and weakness.  Endo/Heme/Allergies: Does not bruise/bleed easily.  Psychiatric/Behavioral: Negative for depression. The patient is not nervous/anxious and does not have insomnia.   All other systems reviewed and are negative.   Objective: BP 128/70   Pulse (!) 117   Wt 189 lb (85.7 kg)   LMP 10/31/2018   BMI 33.48 kg/m  Physical Exam Constitutional:      General: She is not in acute distress.    Appearance: She is well-developed.  Musculoskeletal: Normal range of motion.  Neurological:     Mental Status: She is alert and oriented to person, place, and time.  Skin:    General: Skin is warm and dry.  Vitals signs reviewed.   FHT 150s  Assessment: 23 y.o. [id number]  1. [redacted] weeks gestation of pregnancy - POC Urinalysis Dipstick OB  2. Right upper quadrant pain [place], concern for MS vs liver, relationship to preeclampsia if diagnosed  3. Gestational edema in second trimester  Evaluate for preeclampsia or other etiology to pain, edema To L&D for eval now  Barnett Applebaum, MD, [facility], [facility] 04/23/2019  4:59 PM

## 2019-04-23 NOTE — Final Progress Note (Signed)
Physician Final Progress Note  Patient ID: Molly Bray MRN: [id number] DOB/AGE: Aug 03, 1995 23 y.o.  Admit date: 04/23/2019 Admitting provider: Will Bonnet, MD Discharge date: 04/23/2019   Admission Diagnoses:  1) Right upper quadrant abdominal pain in pregnancy, second trimester  2) intrauterine pregnancy at [redacted]w[redacted]d  3) status post cholecystectomy  Discharge Diagnoses:  1) Right upper quadrant abdominal pain in pregnancy, second trimester  2) intrauterine pregnancy at [redacted]w[redacted]d  3) status post cholecystectomy  History of Present Illness: The patient is a 23 y.o. female [phone] at [redacted]w[redacted]d who presents for a week of RUQ pain.  The pain is sharp, constant, 9/10, non-radiating.  Lying on that side makes the pain better.  Pushing on the area, lifting things (like people), makes it feel worse.  Associated symptoms include nausea with emesis x 3 today. Denies hematemesis.  She has also had a headache, but states she hasn't been sleeping.  With these symptoms she was sent from the office for preeclampsia evaluation.  She denies visual changes. She notes +FM, no vaginal bleeding, no leakage of fluid, no contractions.  She has had her gall bladder removed. She denies trauma to the area, but notes that if she lifts her bra wire off the area it helps with the pain.    Past Medical History:  Diagnosis Date   Anxiety    Asthma    Chronic kidney disease    KIDNEY REFLUX   Depression    GERD (gastroesophageal reflux disease)    WITH PREGNANCY ONLY   Headache    MIGRAINES   History of methicillin resistant staphylococcus aureus (MRSA) 2008   IBS (irritable bowel syndrome)    constipation   Pinched nerve    Pyelonephritis    Recurrent UTI     Past Surgical History:  Procedure Laterality Date   CHOLECYSTECTOMY N/A 03/07/2018   Procedure: LAPAROSCOPIC CHOLECYSTECTOMY;  Surgeon: Benjamine Sprague, DO;  Location: [facility] ORS;  Service: General;  Laterality: N/A;   KIDNEY SURGERY       No current facility-administered medications on file prior to encounter.    Current Outpatient Medications on File Prior to Encounter  Medication Sig Dispense Refill   esomeprazole (NEXIUM) 20 MG capsule Take 1 capsule (20 mg total) by mouth daily at 12 noon. 30 capsule 11   hydroxyprogesterone caproate (MAKENA) 250 mg/mL OIL injection Inject 1 mL (250 mg total) into the muscle once for 1 dose. For weekly injection in office from 16 weeks to 36 weeks of pregnancy. 5 mL 4   Prenatal Vit-Fe Fumarate-FA (PRENATAL VITAMIN PO) Take by mouth.     albuterol (VENTOLIN HFA) 108 (90 Base) MCG/ACT inhaler Inhale 2 puffs into the lungs every 6 (six) hours as needed for wheezing or shortness of breath. (Patient not taking: Reported on 04/23/2019) 1 Inhaler 2    Allergies  Allergen Reactions   Amoxicillin Anaphylaxis   Vancomycin Itching    Patient with obvious "redmans" syndrome   Penicillins Swelling       Silver Sulfadiazine Other (See Comments)    Pain/burning sensation    Social History   Socioeconomic History   Marital status: Legally Separated    Spouse name: Not on file   Number of children: Not on file   Years of education: Not on file   Highest education level: Not on file  Occupational History   Not on file  Social Needs   Financial resource strain: Not hard at all   Food insecurity  Worry: Never true    Inability: Never true   Transportation needs    Medical: No    Non-medical: No  Tobacco Use   Smoking status: Current Every Day Smoker    Packs/day: 0.50    Years: 8.00    Pack years: 4.00    Types: Cigarettes   Smokeless tobacco: Never Used  Substance and Sexual Activity   Alcohol use: No   Drug use: No   Sexual activity: Yes    Birth control/protection: None    Comment: planning tubal  Lifestyle   Physical activity    Days per week: Patient refused    Minutes per session: Patient refused   Stress: Very much  Relationships    Social connections    Talks on phone: More than three times a week    Gets together: Once a week    Attends religious service: More than 4 times per year    Active member of club or organization: No    Attends meetings of clubs or organizations: Never    Relationship status: Separated   Intimate partner violence    Fear of current or ex partner: No    Emotionally abused: No    Physically abused: No    Forced sexual activity: No  Other Topics Concern   Not on file  Social History Narrative   Not on file    Family History  Problem Relation Age of Onset   COPD Mother    Diabetes Maternal Aunt    COPD Father    Hypertension Neg Hx    Ovarian cancer Neg Hx    Breast cancer Neg Hx    Heart disease Neg Hx      Review of Systems  Constitutional: Negative.  Negative for chills, fever and malaise/fatigue.  HENT: Negative.   Eyes: Negative.  Negative for blurred vision and double vision.  Respiratory: Negative.  Negative for cough, hemoptysis, sputum production, shortness of breath and wheezing.   Cardiovascular: Positive for chest pain (rib cage on right side, mid clavicular line). Negative for palpitations, orthopnea, claudication, leg swelling and PND.  Gastrointestinal: Positive for abdominal pain, constipation, nausea and vomiting. Negative for blood in stool, diarrhea, heartburn and melena.  Genitourinary: Negative.  Negative for flank pain and hematuria.  Musculoskeletal: Negative.   Skin: Negative.   Neurological: Positive for headaches. Negative for dizziness, tingling, tremors, sensory change, speech change, focal weakness, seizures, loss of consciousness and weakness.  Psychiatric/Behavioral: Negative.      Physical Exam: BP 107/60 (BP Location: Left Arm)    Pulse (!) 103    Temp 97.9 F (36.6 C) (Oral)    Resp 14    Ht 5\' 3"  (1.6 m)    Wt 84.4 kg    LMP 10/31/2018    BMI 32.95 kg/m   Physical Exam Constitutional:      General: She is not in acute  distress.    Appearance: Normal appearance. She is well-developed.  HENT:     Head: Normocephalic and atraumatic.  Eyes:     General: No scleral icterus.    Conjunctiva/sclera: Conjunctivae normal.  Neck:     Musculoskeletal: Normal range of motion and neck supple.  Cardiovascular:     Rate and Rhythm: Normal rate and regular rhythm.     Heart sounds: No murmur. No friction rub. No gallop.   Pulmonary:     Effort: Pulmonary effort is normal. No respiratory distress.     Breath sounds: Normal breath sounds.  No wheezing or rales.  Chest:     Chest wall: Tenderness present. No mass, deformity, swelling or edema.    Abdominal:     General: Bowel sounds are normal. There is no distension.     Palpations: Abdomen is soft. There is no mass.     Tenderness: There is no abdominal tenderness. There is no guarding or rebound.  Musculoskeletal: Normal range of motion.  Neurological:     General: No focal deficit present.     Mental Status: She is alert and oriented to person, place, and time.     Cranial Nerves: No cranial nerve deficit.  Skin:    General: Skin is warm and dry.     Findings: No erythema.  Psychiatric:        Mood and Affect: Mood normal.        Behavior: Behavior normal.        Judgment: Judgment normal.   Female chaperone present for pelvic exam:   Consults: None  Significant Findings/ Diagnostic Studies:  [place] Abdomen Limited Ruq  Result Date: 04/23/2019 CLINICAL DATA:  Right upper quadrant abdominal pain worsening over the last week. EXAM: ULTRASOUND ABDOMEN LIMITED RIGHT UPPER QUADRANT COMPARISON:  None. FINDINGS: Gallbladder: Surgically absent. Common bile duct: Diameter: 3 mm Liver: No focal lesion identified. Within normal limits in parenchymal echogenicity. Portal vein is patent on color Doppler imaging with normal direction of blood flow towards the liver. Other: Incidentally noted, mild right renal hydronephrosis. IMPRESSION: 1. Mild right renal hydronephrosis.  2. Cholecystectomy. 3. Otherwise negative exam. Electronically Signed   By: Gaylyn RongWalter  Liebkemann M.D.   On: 04/23/2019 20:19   Procedures:  NST Baseline FHR: 140 beats/min Variability: moderate Accelerations: present Decelerations: absent Tocometry: quiet  Interpretation:  INDICATIONS: rule out uterine contractions RESULTS:  A NST procedure was performed with FHR monitoring and a normal baseline established, appropriate time of 20-40 minutes of evaluation, and accels >2 seen w 15x15 characteristics.  Results show a REACTIVE NST.    Hospital Course: The patient was admitted to Labor and Delivery Triage for observation. She had normal vital signs. She had negative preeclampsia labs.  She had a normal RUQ ultrasound.  Exam consistent with MSK pain, as she had tenderness to very shallow palpation.    Discharge Condition: stable  Disposition: Discharge disposition: 01-Home or Self Care       Diet: Regular diet  Discharge Activity: Activity as tolerated   Allergies as of 04/23/2019      Reactions   Amoxicillin Anaphylaxis   Vancomycin Itching   Patient with obvious "redmans" syndrome   Penicillins Swelling      Silver Sulfadiazine Other (See Comments)   Pain/burning sensation      Medication List    TAKE these medications   albuterol 108 (90 Base) MCG/ACT inhaler Commonly known as: VENTOLIN HFA Inhale 2 puffs into the lungs every 6 (six) hours as needed for wheezing or shortness of breath.   esomeprazole 20 MG capsule Commonly known as: NexIUM Take 1 capsule (20 mg total) by mouth daily at 12 noon.   hydroxyprogesterone caproate 250 mg/mL Oil injection Commonly known as: MAKENA Inject 1 mL (250 mg total) into the muscle once for 1 dose. For weekly injection in office from 16 weeks to 36 weeks of pregnancy.   PRENATAL VITAMIN PO Take by mouth.        Total time spent taking care of this patient: 30 minutes  Signed: Thomasene MohairStephen Gazelle Towe, MD  04/23/2019, 9:15 PM

## 2019-04-23 NOTE — Discharge Instructions (Signed)
Abdominal Pain During Pregnancy  Belly (abdominal) pain is common during pregnancy. There are many possible causes. Most of the time, it is not a serious problem. Other times, it can be a sign that something is wrong with the pregnancy. Always tell your doctor if you have belly pain. Follow these instructions at home:  Do not have sex or put anything in your vagina until your pain goes away completely.  Get plenty of rest until your pain gets better.  Drink enough fluid to keep your pee (urine) pale yellow.  Take over-the-counter and prescription medicines only as told by your doctor.  Keep all follow-up visits as told by your doctor. This is important. Contact a doctor if:  Your pain continues or gets worse after resting.  You have lower belly pain that: ? Comes and goes at regular times. ? Spreads to your back. ? Feels like menstrual cramps.  You have pain or burning when you pee (urinate). Get help right away if:  You have a fever or chills.  You have vaginal bleeding.  You are leaking fluid from your vagina.  You are passing tissue from your vagina.  You throw up (vomit) for more than 24 hours.  You have watery poop (diarrhea) for more than 24 hours.  Your baby is moving less than usual.  You feel very weak or faint.  You have shortness of breath.  You have very bad pain in your upper belly. Summary  Belly (abdominal) pain is common during pregnancy. There are many possible causes.  If you have belly pain during pregnancy, tell your doctor right away.  Keep all follow-up visits as told by your doctor. This is important. This information is not intended to replace advice given to you by your health care provider. Make sure you discuss any questions you have with your health care provider. Document Released: 06/20/2009 Document Revised: 10/20/2018 Document Reviewed: 10/04/2016 Elsevier Patient Education  2020 [employer].    Preterm Labor and Birth  Information Pregnancy normally lasts 39-41 weeks. Preterm labor is when labor starts early. It starts before you have been pregnant for 37 whole weeks. What are the risk factors for preterm labor? Preterm labor is more likely to occur in women who:  Have an infection while pregnant.  Have a cervix that is short.  Have gone into preterm labor before.  Have had surgery on their cervix.  Are younger than age 26.  Are older than age 22.  Are African American.  Are pregnant with two or more babies.  Take street drugs while pregnant.  Smoke while pregnant.  Do not gain enough weight while pregnant.  Got pregnant right after another pregnancy. What are the symptoms of preterm labor? Symptoms of preterm labor include:  Cramps. The cramps may feel like the cramps some women get during their period. The cramps may happen with watery poop (diarrhea).  Pain in the belly (abdomen).  Pain in the lower back.  Regular contractions or tightening. It may feel like your belly is getting tighter.  Pressure in the lower belly that seems to get stronger.  More fluid (discharge) leaking from the vagina. The fluid may be watery or bloody.  Water breaking. Why is it important to notice signs of preterm labor? Babies who are born early may not be fully developed. They have a higher chance for:  Long-term heart problems.  Long-term lung problems.  Trouble controlling body systems, like breathing.  Bleeding in the brain.  A condition  called cerebral palsy.  Learning difficulties.  Death. These risks are highest for babies who are born before 55 weeks of pregnancy. How is preterm labor treated? Treatment depends on:  How long you were pregnant.  Your condition.  The health of your baby. Treatment may involve:  Having a stitch (suture) placed in your cervix. When you give birth, your cervix opens so the baby can come out. The stitch keeps the cervix from opening too  soon.  Staying at the hospital.  Taking or getting medicines, such as: ? Hormone medicines. ? Medicines to stop contractions. ? Medicines to help the babys lungs develop. ? Medicines to prevent your baby from having cerebral palsy. What should I do if I am in preterm labor? If you think you are going into labor too soon, call your doctor right away. How can I prevent preterm labor?  Do not use any tobacco products. ? Examples of these are cigarettes, chewing tobacco, and e-cigarettes. ? If you need help quitting, ask your doctor.  Do not use street drugs.  Do not use any medicines unless you ask your doctor if they are safe for you.  Talk with your doctor before taking any herbal supplements.  Make sure you gain enough weight.  Watch for infection. If you think you might have an infection, get it checked right away.  If you have gone into preterm labor before, tell your doctor. This information is not intended to replace advice given to you by your health care provider. Make sure you discuss any questions you have with your health care provider. Document Released: 09/28/2008 Document Revised: 10/24/2018 Document Reviewed: 11/23/2015 Elsevier Patient Education  2020 [employer] Inc.\    Fetal Movement Counts Patient Name: ________________________________________________ Patient Due Date: ____________________ What is a fetal movement count?  A fetal movement count is the number of times that you feel your baby move during a certain amount of time. This may also be called a fetal kick count. A fetal movement count is recommended for every pregnant woman. You may be asked to start counting fetal movements as early as week 28 of your pregnancy. Pay attention to when your baby is most active. You may notice your baby's sleep and wake cycles. You may also notice things that make your baby move more. You should do a fetal movement count:  When your baby is normally most active.  At  the same time each day. A good time to count movements is while you are resting, after having something to eat and drink. How do I count fetal movements? 1. Find a quiet, comfortable area. Sit, or lie down on your side. 2. Write down the date, the start time and stop time, and the number of movements that you felt between those two times. Take this information with you to your health care visits. 3. For 2 hours, count kicks, flutters, swishes, rolls, and jabs. You should feel at least 10 movements during 2 hours. 4. You may stop counting after you have felt 10 movements. 5. If you do not feel 10 movements in 2 hours, have something to eat and drink. Then, keep resting and counting for 1 hour. If you feel at least 4 movements during that hour, you may stop counting. Contact a health care provider if:  You feel fewer than 4 movements in 2 hours.  Your baby is not moving like he or she usually does. Date: ____________ Start time: ____________ Stop time: ____________ Movements: ____________ Date: ____________ Start  time: ____________ Stop time: ____________ Movements: ____________ Date: ____________ Start time: ____________ Stop time: ____________ Movements: ____________ Date: ____________ Start time: ____________ Stop time: ____________ Movements: ____________ Date: ____________ Start time: ____________ Stop time: ____________ Movements: ____________ Date: ____________ Start time: ____________ Stop time: ____________ Movements: ____________ Date: ____________ Start time: ____________ Stop time: ____________ Movements: ____________ Date: ____________ Start time: ____________ Stop time: ____________ Movements: ____________ Date: ____________ Start time: ____________ Stop time: ____________ Movements: ____________ This information is not intended to replace advice given to you by your health care provider. Make sure you discuss any questions you have with your health care provider. Document  Released: 08/01/2006 Document Revised: 07/22/2018 Document Reviewed: 08/11/2015 Elsevier Patient Education  2020 [employer]

## 2019-04-23 NOTE — OB Triage Note (Signed)
Pt presented to L/D from office with a constant, sharp RUQ abdominal pain rated 9/10 that she states began last week. She also is having light, constant abdominal cramping. Intermittent anterior headache rated 5/10- some relief from tylenol but then it returned. No bleeding or LOF. Positive fetal movement. No abnormal urinary symptoms for the pt. (She states that she has urinary issues). VSS.Will continue to monitor.

## 2019-04-24 NOTE — Discharge Summary (Signed)
See Final Progress Note 

## 2019-04-30 ENCOUNTER — Other Ambulatory Visit: Payer: Self-pay

## 2019-04-30 ENCOUNTER — Ambulatory Visit (INDEPENDENT_AMBULATORY_CARE_PROVIDER_SITE_OTHER): Payer: Medicaid Other

## 2019-04-30 DIAGNOSIS — Z3A27 27 weeks gestation of pregnancy: Secondary | ICD-10-CM

## 2019-04-30 DIAGNOSIS — O09212 Supervision of pregnancy with history of pre-term labor, second trimester: Secondary | ICD-10-CM

## 2019-04-30 DIAGNOSIS — O09899 Supervision of other high risk pregnancies, unspecified trimester: Secondary | ICD-10-CM

## 2019-04-30 MED ORDER — HYDROXYPROGESTERONE CAPROATE 250 MG/ML IM OIL
250.0000 mg | TOPICAL_OIL | Freq: Once | INTRAMUSCULAR | Status: AC
  Administered 2019-04-30: 250 mg via INTRAMUSCULAR

## 2019-04-30 NOTE — Progress Notes (Signed)
Pt here for hydroxyprogesterone inj which was given IM right glut.  [facility] # [id number].  Pt asked if okay to get shots on Fridays instead of Thursdays b/c she is now off on Fridays.  Adv that would be fine.

## 2019-05-04 ENCOUNTER — Other Ambulatory Visit: Payer: Self-pay | Admitting: Obstetrics & Gynecology

## 2019-05-04 ENCOUNTER — Encounter: Payer: Self-pay | Admitting: Obstetrics & Gynecology

## 2019-05-04 MED ORDER — CYCLOBENZAPRINE HCL 10 MG PO TABS: 10.0000 mg | ORAL_TABLET | Freq: Three times a day (TID) | ORAL | 1 refills | Status: DC | PRN

## 2019-05-07 ENCOUNTER — Other Ambulatory Visit: Payer: Medicaid Other

## 2019-05-07 ENCOUNTER — Encounter: Payer: Medicaid Other | Admitting: Advanced Practice Midwife

## 2019-05-08 ENCOUNTER — Other Ambulatory Visit: Payer: Medicaid Other

## 2019-05-08 ENCOUNTER — Other Ambulatory Visit: Payer: Self-pay

## 2019-05-08 ENCOUNTER — Ambulatory Visit (INDEPENDENT_AMBULATORY_CARE_PROVIDER_SITE_OTHER): Payer: Medicaid Other | Admitting: Maternal Newborn

## 2019-05-08 ENCOUNTER — Encounter: Payer: Self-pay | Admitting: Maternal Newborn

## 2019-05-08 VITALS — BP 100/60 | Wt 195.0 lb

## 2019-05-08 DIAGNOSIS — Z8751 Personal history of pre-term labor: Secondary | ICD-10-CM

## 2019-05-08 DIAGNOSIS — O09213 Supervision of pregnancy with history of pre-term labor, third trimester: Secondary | ICD-10-CM | POA: Diagnosis not present

## 2019-05-08 DIAGNOSIS — Z3A28 28 weeks gestation of pregnancy: Secondary | ICD-10-CM | POA: Diagnosis not present

## 2019-05-08 DIAGNOSIS — O09899 Supervision of other high risk pregnancies, unspecified trimester: Secondary | ICD-10-CM

## 2019-05-08 DIAGNOSIS — O0993 Supervision of high risk pregnancy, unspecified, third trimester: Secondary | ICD-10-CM

## 2019-05-08 DIAGNOSIS — O0990 Supervision of high risk pregnancy, unspecified, unspecified trimester: Secondary | ICD-10-CM

## 2019-05-08 LAB — POCT URINALYSIS DIPSTICK OB
Glucose, UA: NEGATIVE
POC,PROTEIN,UA: NEGATIVE

## 2019-05-08 MED ORDER — HYDROXYPROGESTERONE CAPROATE 250 MG/ML IM OIL
250.0000 mg | TOPICAL_OIL | Freq: Once | INTRAMUSCULAR | Status: AC
  Administered 2019-05-08: 250 mg via INTRAMUSCULAR

## 2019-05-08 NOTE — Patient Instructions (Signed)
Third Trimester of Pregnancy The third trimester is from week 28 through week 40 (months 7 through 9). The third trimester is a time when the unborn baby (fetus) is growing rapidly. At the end of the ninth month, the fetus is about 20 inches in length and weighs 6-10 pounds. Body changes during your third trimester Your body will continue to go through many changes during pregnancy. The changes vary from woman to woman. During the third trimester:  Your weight will continue to increase. You can expect to gain 25-35 pounds (11-16 kg) by the end of the pregnancy.  You may begin to get stretch marks on your hips, abdomen, and breasts.  You may urinate more often because the fetus is moving lower into your pelvis and pressing on your bladder.  You may develop or continue to have heartburn. This is caused by increased hormones that slow down muscles in the digestive tract.  You may develop or continue to have constipation because increased hormones slow digestion and cause the muscles that push waste through your intestines to relax.  You may develop hemorrhoids. These are swollen veins (varicose veins) in the rectum that can itch or be painful.  You may develop swollen, bulging veins (varicose veins) in your legs.  You may have increased body aches in the pelvis, back, or thighs. This is due to weight gain and increased hormones that are relaxing your joints.  You may have changes in your hair. These can include thickening of your hair, rapid growth, and changes in texture. Some women also have hair loss during or after pregnancy, or hair that feels dry or thin. Your hair will most likely return to normal after your baby is born.  Your breasts will continue to grow and they will continue to become tender. A yellow fluid (colostrum) may leak from your breasts. This is the first milk you are producing for your baby.  Your belly button may stick out.  You may notice more swelling in your hands,  face, or ankles.  You may have increased tingling or numbness in your hands, arms, and legs. The skin on your belly may also feel numb.  You may feel short of breath because of your expanding uterus.  You may have more problems sleeping. This can be caused by the size of your belly, increased need to urinate, and an increase in your body's metabolism.  You may notice the fetus "dropping," or moving lower in your abdomen (lightening).  You may have increased vaginal discharge.  You may notice your joints feel loose and you may have pain around your pelvic bone. What to expect at prenatal visits You will have prenatal exams every 2 weeks until week 36. Then you will have weekly prenatal exams. During a routine prenatal visit:  You will be weighed to make sure you and the baby are growing normally.  Your blood pressure will be taken.  Your abdomen will be measured to track your baby's growth.  The fetal heartbeat will be listened to.  Any test results from the previous visit will be discussed.  You may have a cervical check near your due date to see if your cervix has softened or thinned (effaced).  You will be tested for Group B streptococcus. This happens between 35 and 37 weeks. Your health care provider may ask you:  What your birth plan is.  How you are feeling.  If you are feeling the baby move.  If you have had any abnormal   symptoms, such as leaking fluid, bleeding, severe headaches, or abdominal cramping.  If you are using any tobacco products, including cigarettes, chewing tobacco, and electronic cigarettes.  If you have any questions. Other tests or screenings that may be performed during your third trimester include:  Blood tests that check for low iron levels (anemia).  Fetal testing to check the health, activity level, and growth of the fetus. Testing is done if you have certain medical conditions or if there are problems during the pregnancy.  Nonstress test  (NST). This test checks the health of your baby to make sure there are no signs of problems, such as the baby not getting enough oxygen. During this test, a belt is placed around your belly. The baby is made to move, and its heart rate is monitored during movement. What is false labor? False labor is a condition in which you feel small, irregular tightenings of the muscles in the womb (contractions) that usually go away with rest, changing position, or drinking water. These are called Braxton Hicks contractions. Contractions may last for hours, days, or even weeks before true labor sets in. If contractions come at regular intervals, become more frequent, increase in intensity, or become painful, you should see your health care provider. What are the signs of labor?  Abdominal cramps.  Regular contractions that start at 10 minutes apart and become stronger and more frequent with time.  Contractions that start on the top of the uterus and spread down to the lower abdomen and back.  Increased pelvic pressure and dull back pain.  A watery or bloody mucus discharge that comes from the vagina.  Leaking of amniotic fluid. This is also known as your "water breaking." It could be a slow trickle or a gush. Let your health care provider know if it has a color or strange odor. If you have any of these signs, call your health care provider right away, even if it is before your due date. Follow these instructions at home: Medicines  Follow your health care provider's instructions regarding medicine use. Specific medicines may be either safe or unsafe to take during pregnancy.  Take a prenatal vitamin that contains at least 600 micrograms (mcg) of folic acid.  If you develop constipation, try taking a stool softener if your health care provider approves. Eating and drinking   Eat a balanced diet that includes fresh fruits and vegetables, whole grains, good sources of protein such as meat, eggs, or tofu,  and low-fat dairy. Your health care provider will help you determine the amount of weight gain that is right for you.  Avoid raw meat and uncooked cheese. These carry germs that can cause birth defects in the baby.  If you have low calcium intake from food, talk to your health care provider about whether you should take a daily calcium supplement.  Eat four or five small meals rather than three large meals a day.  Limit foods that are high in fat and processed sugars, such as fried and sweet foods.  To prevent constipation: ? Drink enough fluid to keep your urine clear or pale yellow. ? Eat foods that are high in fiber, such as fresh fruits and vegetables, whole grains, and beans. Activity  Exercise only as directed by your health care provider. Most women can continue their usual exercise routine during pregnancy. Try to exercise for 30 minutes at least 5 days a week. Stop exercising if you experience uterine contractions.  Avoid heavy lifting.  Do   not exercise in extreme heat or humidity, or at high altitudes.  Wear low-heel, comfortable shoes.  Practice good posture.  You may continue to have sex unless your health care provider tells you otherwise. Relieving pain and discomfort  Take frequent breaks and rest with your legs elevated if you have leg cramps or low back pain.  Take warm sitz baths to soothe any pain or discomfort caused by hemorrhoids. Use hemorrhoid cream if your health care provider approves.  Wear a good support bra to prevent discomfort from breast tenderness.  If you develop varicose veins: ? Wear support pantyhose or compression stockings as told by your healthcare provider. ? Elevate your feet for 15 minutes, 3-4 times a day. Prenatal care  Write down your questions. Take them to your prenatal visits.  Keep all your prenatal visits as told by your health care provider. This is important. Safety  Wear your seat belt at all times when driving.  Make  a list of emergency phone numbers, including numbers for family, friends, the hospital, and police and fire departments. General instructions  Avoid cat litter boxes and soil used by cats. These carry germs that can cause birth defects in the baby. If you have a cat, ask someone to clean the litter box for you.  Do not travel far distances unless it is absolutely necessary and only with the approval of your health care provider.  Do not use hot tubs, steam rooms, or saunas.  Do not drink alcohol.  Do not use any products that contain nicotine or tobacco, such as cigarettes and e-cigarettes. If you need help quitting, ask your health care provider.  Do not use any medicinal herbs or unprescribed drugs. These chemicals affect the formation and growth of the baby.  Do not douche or use tampons or scented sanitary pads.  Do not cross your legs for long periods of time.  To prepare for the arrival of your baby: ? Take prenatal classes to understand, practice, and ask questions about labor and delivery. ? Make a trial run to the hospital. ? Visit the hospital and tour the maternity area. ? Arrange for maternity or paternity leave through employers. ? Arrange for family and friends to take care of pets while you are in the hospital. ? Purchase a rear-facing car seat and make sure you know how to install it in your car. ? Pack your hospital bag. ? Prepare the baby's nursery. Make sure to remove all pillows and stuffed animals from the baby's crib to prevent suffocation.  Visit your dentist if you have not gone during your pregnancy. Use a soft toothbrush to brush your teeth and be gentle when you floss. Contact a health care provider if:  You are unsure if you are in labor or if your water has broken.  You become dizzy.  You have mild pelvic cramps, pelvic pressure, or nagging pain in your abdominal area.  You have lower back pain.  You have persistent nausea, vomiting, or diarrhea.   You have an unusual or bad smelling vaginal discharge.  You have pain when you urinate. Get help right away if:  Your water breaks before 37 weeks.  You have regular contractions less than 5 minutes apart before 37 weeks.  You have a fever.  You are leaking fluid from your vagina.  You have spotting or bleeding from your vagina.  You have severe abdominal pain or cramping.  You have rapid weight loss or weight gain.  You have   shortness of breath with chest pain.  You notice sudden or extreme swelling of your face, hands, ankles, feet, or legs.  Your baby makes fewer than 10 movements in 2 hours.  You have severe headaches that do not go away when you take medicine.  You have vision changes. Summary  The third trimester is from week 28 through week 40, months 7 through 9. The third trimester is a time when the unborn baby (fetus) is growing rapidly.  During the third trimester, your discomfort may increase as you and your baby continue to gain weight. You may have abdominal, leg, and back pain, sleeping problems, and an increased need to urinate.  During the third trimester your breasts will keep growing and they will continue to become tender. A yellow fluid (colostrum) may leak from your breasts. This is the first milk you are producing for your baby.  False labor is a condition in which you feel small, irregular tightenings of the muscles in the womb (contractions) that eventually go away. These are called Braxton Hicks contractions. Contractions may last for hours, days, or even weeks before true labor sets in.  Signs of labor can include: abdominal cramps; regular contractions that start at 10 minutes apart and become stronger and more frequent with time; watery or bloody mucus discharge that comes from the vagina; increased pelvic pressure and dull back pain; and leaking of amniotic fluid. This information is not intended to replace advice given to you by your health  care provider. Make sure you discuss any questions you have with your health care provider. Document Released: 06/26/2001 Document Revised: 10/23/2018 Document Reviewed: 08/07/2016 Elsevier Patient Education  2020 [employer]  
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
## 2019-05-11 ENCOUNTER — Other Ambulatory Visit: Payer: Medicaid Other

## 2019-05-11 ENCOUNTER — Other Ambulatory Visit: Payer: Self-pay

## 2019-05-11 DIAGNOSIS — Z131 Encounter for screening for diabetes mellitus: Secondary | ICD-10-CM

## 2019-05-12 LAB — 28 WEEK RH+PANEL
Basophils Absolute: 0 10*3/uL (ref 0.0–0.2)
Basos: 0 %
EOS (ABSOLUTE): 0.2 10*3/uL (ref 0.0–0.4)
Eos: 2 %
Gestational Diabetes Screen: 113 mg/dL (ref 65–139)
HIV Screen 4th Generation wRfx: NONREACTIVE
Hematocrit: 37.4 % (ref 34.0–46.6)
Hemoglobin: 12.6 g/dL (ref 11.1–15.9)
Immature Grans (Abs): 0.1 10*3/uL (ref 0.0–0.1)
Immature Granulocytes: 1 %
Lymphocytes Absolute: 2.1 10*3/uL (ref 0.7–3.1)
Lymphs: 18 %
MCH: 31 pg (ref 26.6–33.0)
MCHC: 33.7 g/dL (ref 31.5–35.7)
MCV: 92 fL (ref 79–97)
Monocytes Absolute: 0.6 10*3/uL (ref 0.1–0.9)
Monocytes: 5 %
Neutrophils Absolute: 8.6 10*3/uL — ABNORMAL HIGH (ref 1.4–7.0)
Neutrophils: 74 %
Platelets: 276 10*3/uL (ref 150–450)
RBC: 4.06 x10E6/uL (ref 3.77–5.28)
RDW: 12.7 % (ref 11.7–15.4)
RPR Ser Ql: NONREACTIVE
WBC: 11.7 10*3/uL — ABNORMAL HIGH (ref 3.4–10.8)

## 2019-05-15 ENCOUNTER — Ambulatory Visit (INDEPENDENT_AMBULATORY_CARE_PROVIDER_SITE_OTHER): Payer: Medicaid Other

## 2019-05-15 ENCOUNTER — Other Ambulatory Visit: Payer: Self-pay

## 2019-05-15 DIAGNOSIS — O09213 Supervision of pregnancy with history of pre-term labor, third trimester: Secondary | ICD-10-CM

## 2019-05-15 DIAGNOSIS — Z3A29 29 weeks gestation of pregnancy: Secondary | ICD-10-CM | POA: Diagnosis not present

## 2019-05-15 DIAGNOSIS — O0990 Supervision of high risk pregnancy, unspecified, unspecified trimester: Secondary | ICD-10-CM

## 2019-05-15 MED ORDER — HYDROXYPROGESTERONE CAPROATE 250 MG/ML IM OIL
250.0000 mg | TOPICAL_OIL | Freq: Once | INTRAMUSCULAR | Status: AC
  Administered 2019-05-15: 250 mg via INTRAMUSCULAR

## 2019-05-22 ENCOUNTER — Other Ambulatory Visit: Payer: Self-pay

## 2019-05-22 ENCOUNTER — Encounter: Payer: Medicaid Other | Admitting: Certified Nurse Midwife

## 2019-05-22 ENCOUNTER — Ambulatory Visit: Payer: Medicaid Other

## 2019-05-22 ENCOUNTER — Ambulatory Visit (INDEPENDENT_AMBULATORY_CARE_PROVIDER_SITE_OTHER): Payer: Medicaid Other | Admitting: Obstetrics & Gynecology

## 2019-05-22 ENCOUNTER — Encounter: Payer: Self-pay | Admitting: Obstetrics & Gynecology

## 2019-05-22 VITALS — BP 120/80 | Wt 194.0 lb

## 2019-05-22 DIAGNOSIS — O09213 Supervision of pregnancy with history of pre-term labor, third trimester: Secondary | ICD-10-CM

## 2019-05-22 DIAGNOSIS — Z23 Encounter for immunization: Secondary | ICD-10-CM | POA: Diagnosis not present

## 2019-05-22 DIAGNOSIS — Z8751 Personal history of pre-term labor: Secondary | ICD-10-CM

## 2019-05-22 DIAGNOSIS — Z3A30 30 weeks gestation of pregnancy: Secondary | ICD-10-CM | POA: Diagnosis not present

## 2019-05-22 DIAGNOSIS — O0990 Supervision of high risk pregnancy, unspecified, unspecified trimester: Secondary | ICD-10-CM

## 2019-05-22 MED ORDER — HYDROXYPROGESTERONE CAPROATE 250 MG/ML IM OIL
250.0000 mg | TOPICAL_OIL | Freq: Once | INTRAMUSCULAR | Status: AC
  Administered 2019-05-22: 250 mg via INTRAMUSCULAR

## 2019-05-22 MED ORDER — OXYCODONE-ACETAMINOPHEN 10-325 MG PO TABS: 1.0000 | ORAL_TABLET | Freq: Four times a day (QID) | ORAL | 0 refills | Status: DC | PRN

## 2019-05-22 NOTE — Addendum Note (Signed)
Addended by: Quintella Baton D on: 05/22/2019 02:49 PM   Modules accepted: Orders

## 2019-05-22 NOTE — Progress Notes (Signed)
  Subjective  Fetal Movement? yes Contractions? no Leaking Fluid? no Vaginal Bleeding? no Hernia at umbilcus more painful.  Also has continued RUQ pain improved w Flexeril   Objective  BP 120/80   Wt 194 lb (88 kg)   LMP 10/31/2018   BMI 34.37 kg/m  General: NAD Pumonary: no increased work of breathing Abdomen: gravid, non-tender Extremities: no edema Psychiatric: mood appropriate, affect full  Assessment  23 y.o. [id number] at [redacted]w[redacted]d by  07/27/2019, Alternate EDD Entry presenting for routine prenatal visit  Plan   Problem List Items Addressed This Visit      Other   Supervision of high risk pregnancy, antepartum    Other Visit Diagnoses    [redacted] weeks gestation of pregnancy    -  Primary   History of preterm labor          pregnancy5 Problems (from 10/31/18 to present)    Problem Noted Resolved   Supervision of high risk pregnancy, antepartum 12/25/2018 by Rexene Agent, CNM No   Overview Addendum 01/26/2019 11:47 AM by Gae Dry, MD    [facility] Prenatal Labs  Dating [place] Blood type: O/Positive/-- (05/27 1511)   Genetic Screen NIPS: nml XY Antibody:Negative (05/27 1511)  Anatomic [place] 19 wks Rubella: 1.30 (05/27 1511) Varicella: Immune  GTT Third trimester:  RPR: Non Reactive (05/27 1511)   Rhogam n/a HBsAg: Negative (05/27 1511)   TDaP vaccine     11/6         Flu Shot:decline HIV: Non Reactive (05/27 1511)   Baby Food      breast                          GBS: in urine  Contraception      unsure Pap: 11/2018 ASCUS, repeat pp  CBB  no   CS/VBAC n/a   Support Person husband    17OHP for PTL prevention           Discussed management of pain and hernia     Occas percocet use, avoid prolonged use, risks to fetus and addiction discussed     Plan Hernia repair w Interval PP BTL by laparoscopy at or after 6 weeks from delivery  Makena weekly  Barnett Applebaum, MD, [place], [facility] 05/22/2019  2:29 PM

## 2019-05-22 NOTE — Patient Instructions (Signed)
Third Trimester of Pregnancy The third trimester is from week 28 through week 40 (months 7 through 9). The third trimester is a time when the unborn baby (fetus) is growing rapidly. At the end of the ninth month, the fetus is about 20 inches in length and weighs 6-10 pounds. Body changes during your third trimester Your body will continue to go through many changes during pregnancy. The changes vary from woman to woman. During the third trimester:  Your weight will continue to increase. You can expect to gain 25-35 pounds (11-16 kg) by the end of the pregnancy.  You may begin to get stretch marks on your hips, abdomen, and breasts.  You may urinate more often because the fetus is moving lower into your pelvis and pressing on your bladder.  You may develop or continue to have heartburn. This is caused by increased hormones that slow down muscles in the digestive tract.  You may develop or continue to have constipation because increased hormones slow digestion and cause the muscles that push waste through your intestines to relax.  You may develop hemorrhoids. These are swollen veins (varicose veins) in the rectum that can itch or be painful.  You may develop swollen, bulging veins (varicose veins) in your legs.  You may have increased body aches in the pelvis, back, or thighs. This is due to weight gain and increased hormones that are relaxing your joints.  You may have changes in your hair. These can include thickening of your hair, rapid growth, and changes in texture. Some women also have hair loss during or after pregnancy, or hair that feels dry or thin. Your hair will most likely return to normal after your baby is born.  Your breasts will continue to grow and they will continue to become tender. A yellow fluid (colostrum) may leak from your breasts. This is the first milk you are producing for your baby.  Your belly button may stick out.  You may notice more swelling in your hands,  face, or ankles.  You may have increased tingling or numbness in your hands, arms, and legs. The skin on your belly may also feel numb.  You may feel short of breath because of your expanding uterus.  You may have more problems sleeping. This can be caused by the size of your belly, increased need to urinate, and an increase in your body's metabolism.  You may notice the fetus "dropping," or moving lower in your abdomen (lightening).  You may have increased vaginal discharge.  You may notice your joints feel loose and you may have pain around your pelvic bone. What to expect at prenatal visits You will have prenatal exams every 2 weeks until week 36. Then you will have weekly prenatal exams. During a routine prenatal visit:  You will be weighed to make sure you and the baby are growing normally.  Your blood pressure will be taken.  Your abdomen will be measured to track your baby's growth.  The fetal heartbeat will be listened to.  Any test results from the previous visit will be discussed.  You may have a cervical check near your due date to see if your cervix has softened or thinned (effaced).  You will be tested for Group B streptococcus. This happens between 35 and 37 weeks. Your health care provider may ask you:  What your birth plan is.  How you are feeling.  If you are feeling the baby move.  If you have had any abnormal   symptoms, such as leaking fluid, bleeding, severe headaches, or abdominal cramping.  If you are using any tobacco products, including cigarettes, chewing tobacco, and electronic cigarettes.  If you have any questions. Other tests or screenings that may be performed during your third trimester include:  Blood tests that check for low iron levels (anemia).  Fetal testing to check the health, activity level, and growth of the fetus. Testing is done if you have certain medical conditions or if there are problems during the pregnancy.  Nonstress test  (NST). This test checks the health of your baby to make sure there are no signs of problems, such as the baby not getting enough oxygen. During this test, a belt is placed around your belly. The baby is made to move, and its heart rate is monitored during movement. What is false labor? False labor is a condition in which you feel small, irregular tightenings of the muscles in the womb (contractions) that usually go away with rest, changing position, or drinking water. These are called Braxton Hicks contractions. Contractions may last for hours, days, or even weeks before true labor sets in. If contractions come at regular intervals, become more frequent, increase in intensity, or become painful, you should see your health care provider. What are the signs of labor?  Abdominal cramps.  Regular contractions that start at 10 minutes apart and become stronger and more frequent with time.  Contractions that start on the top of the uterus and spread down to the lower abdomen and back.  Increased pelvic pressure and dull back pain.  A watery or bloody mucus discharge that comes from the vagina.  Leaking of amniotic fluid. This is also known as your "water breaking." It could be a slow trickle or a gush. Let your health care provider know if it has a color or strange odor. If you have any of these signs, call your health care provider right away, even if it is before your due date. Follow these instructions at home: Medicines  Follow your health care provider's instructions regarding medicine use. Specific medicines may be either safe or unsafe to take during pregnancy.  Take a prenatal vitamin that contains at least 600 micrograms (mcg) of folic acid.  If you develop constipation, try taking a stool softener if your health care provider approves. Eating and drinking   Eat a balanced diet that includes fresh fruits and vegetables, whole grains, good sources of protein such as meat, eggs, or tofu,  and low-fat dairy. Your health care provider will help you determine the amount of weight gain that is right for you.  Avoid raw meat and uncooked cheese. These carry germs that can cause birth defects in the baby.  If you have low calcium intake from food, talk to your health care provider about whether you should take a daily calcium supplement.  Eat four or five small meals rather than three large meals a day.  Limit foods that are high in fat and processed sugars, such as fried and sweet foods.  To prevent constipation: ? Drink enough fluid to keep your urine clear or pale yellow. ? Eat foods that are high in fiber, such as fresh fruits and vegetables, whole grains, and beans. Activity  Exercise only as directed by your health care provider. Most women can continue their usual exercise routine during pregnancy. Try to exercise for 30 minutes at least 5 days a week. Stop exercising if you experience uterine contractions.  Avoid heavy lifting.  Do   not exercise in extreme heat or humidity, or at high altitudes.  Wear low-heel, comfortable shoes.  Practice good posture.  You may continue to have sex unless your health care provider tells you otherwise. Relieving pain and discomfort  Take frequent breaks and rest with your legs elevated if you have leg cramps or low back pain.  Take warm sitz baths to soothe any pain or discomfort caused by hemorrhoids. Use hemorrhoid cream if your health care provider approves.  Wear a good support bra to prevent discomfort from breast tenderness.  If you develop varicose veins: ? Wear support pantyhose or compression stockings as told by your healthcare provider. ? Elevate your feet for 15 minutes, 3-4 times a day. Prenatal care  Write down your questions. Take them to your prenatal visits.  Keep all your prenatal visits as told by your health care provider. This is important. Safety  Wear your seat belt at all times when driving.  Make  a list of emergency phone numbers, including numbers for family, friends, the hospital, and police and fire departments. General instructions  Avoid cat litter boxes and soil used by cats. These carry germs that can cause birth defects in the baby. If you have a cat, ask someone to clean the litter box for you.  Do not travel far distances unless it is absolutely necessary and only with the approval of your health care provider.  Do not use hot tubs, steam rooms, or saunas.  Do not drink alcohol.  Do not use any products that contain nicotine or tobacco, such as cigarettes and e-cigarettes. If you need help quitting, ask your health care provider.  Do not use any medicinal herbs or unprescribed drugs. These chemicals affect the formation and growth of the baby.  Do not douche or use tampons or scented sanitary pads.  Do not cross your legs for long periods of time.  To prepare for the arrival of your baby: ? Take prenatal classes to understand, practice, and ask questions about labor and delivery. ? Make a trial run to the hospital. ? Visit the hospital and tour the maternity area. ? Arrange for maternity or paternity leave through employers. ? Arrange for family and friends to take care of pets while you are in the hospital. ? Purchase a rear-facing car seat and make sure you know how to install it in your car. ? Pack your hospital bag. ? Prepare the baby's nursery. Make sure to remove all pillows and stuffed animals from the baby's crib to prevent suffocation.  Visit your dentist if you have not gone during your pregnancy. Use a soft toothbrush to brush your teeth and be gentle when you floss. Contact a health care provider if:  You are unsure if you are in labor or if your water has broken.  You become dizzy.  You have mild pelvic cramps, pelvic pressure, or nagging pain in your abdominal area.  You have lower back pain.  You have persistent nausea, vomiting, or diarrhea.   You have an unusual or bad smelling vaginal discharge.  You have pain when you urinate. Get help right away if:  Your water breaks before 37 weeks.  You have regular contractions less than 5 minutes apart before 37 weeks.  You have a fever.  You are leaking fluid from your vagina.  You have spotting or bleeding from your vagina.  You have severe abdominal pain or cramping.  You have rapid weight loss or weight gain.  You have   shortness of breath with chest pain.  You notice sudden or extreme swelling of your face, hands, ankles, feet, or legs.  Your baby makes fewer than 10 movements in 2 hours.  You have severe headaches that do not go away when you take medicine.  You have vision changes. Summary  The third trimester is from week 28 through week 40, months 7 through 9. The third trimester is a time when the unborn baby (fetus) is growing rapidly.  During the third trimester, your discomfort may increase as you and your baby continue to gain weight. You may have abdominal, leg, and back pain, sleeping problems, and an increased need to urinate.  During the third trimester your breasts will keep growing and they will continue to become tender. A yellow fluid (colostrum) may leak from your breasts. This is the first milk you are producing for your baby.  False labor is a condition in which you feel small, irregular tightenings of the muscles in the womb (contractions) that eventually go away. These are called Braxton Hicks contractions. Contractions may last for hours, days, or even weeks before true labor sets in.  Signs of labor can include: abdominal cramps; regular contractions that start at 10 minutes apart and become stronger and more frequent with time; watery or bloody mucus discharge that comes from the vagina; increased pelvic pressure and dull back pain; and leaking of amniotic fluid. This information is not intended to replace advice given to you by your health  care provider. Make sure you discuss any questions you have with your health care provider. Document Released: 06/26/2001 Document Revised: 10/23/2018 Document Reviewed: 08/07/2016 Elsevier Patient Education  2020 [employer]  

## 2019-05-26 ENCOUNTER — Encounter: Payer: Medicaid Other | Admitting: Obstetrics & Gynecology

## 2019-05-29 ENCOUNTER — Ambulatory Visit (INDEPENDENT_AMBULATORY_CARE_PROVIDER_SITE_OTHER): Payer: Medicaid Other

## 2019-05-29 ENCOUNTER — Other Ambulatory Visit: Payer: Self-pay

## 2019-05-29 DIAGNOSIS — O09213 Supervision of pregnancy with history of pre-term labor, third trimester: Secondary | ICD-10-CM

## 2019-05-29 DIAGNOSIS — Z3A31 31 weeks gestation of pregnancy: Secondary | ICD-10-CM

## 2019-05-29 DIAGNOSIS — Z8751 Personal history of pre-term labor: Secondary | ICD-10-CM

## 2019-05-29 MED ORDER — HYDROXYPROGESTERONE CAPROATE 250 MG/ML IM OIL
250.0000 mg | TOPICAL_OIL | Freq: Once | INTRAMUSCULAR | Status: AC
  Administered 2019-05-29: 250 mg via INTRAMUSCULAR

## 2019-06-05 ENCOUNTER — Encounter: Payer: Self-pay | Admitting: Obstetrics and Gynecology

## 2019-06-05 ENCOUNTER — Other Ambulatory Visit: Payer: Self-pay

## 2019-06-05 ENCOUNTER — Ambulatory Visit (INDEPENDENT_AMBULATORY_CARE_PROVIDER_SITE_OTHER): Payer: Medicaid Other | Admitting: Obstetrics and Gynecology

## 2019-06-05 VITALS — BP 110/70 | Wt 197.0 lb

## 2019-06-05 DIAGNOSIS — O0990 Supervision of high risk pregnancy, unspecified, unspecified trimester: Secondary | ICD-10-CM

## 2019-06-05 DIAGNOSIS — O26893 Other specified pregnancy related conditions, third trimester: Secondary | ICD-10-CM

## 2019-06-05 DIAGNOSIS — Z8751 Personal history of pre-term labor: Secondary | ICD-10-CM | POA: Insufficient documentation

## 2019-06-05 DIAGNOSIS — O09213 Supervision of pregnancy with history of pre-term labor, third trimester: Secondary | ICD-10-CM | POA: Diagnosis not present

## 2019-06-05 DIAGNOSIS — O09899 Supervision of other high risk pregnancies, unspecified trimester: Secondary | ICD-10-CM | POA: Insufficient documentation

## 2019-06-05 DIAGNOSIS — F419 Anxiety disorder, unspecified: Secondary | ICD-10-CM

## 2019-06-05 DIAGNOSIS — R1011 Right upper quadrant pain: Secondary | ICD-10-CM

## 2019-06-05 DIAGNOSIS — O09893 Supervision of other high risk pregnancies, third trimester: Secondary | ICD-10-CM

## 2019-06-05 DIAGNOSIS — Z3A32 32 weeks gestation of pregnancy: Secondary | ICD-10-CM | POA: Diagnosis not present

## 2019-06-05 DIAGNOSIS — O99343 Other mental disorders complicating pregnancy, third trimester: Secondary | ICD-10-CM

## 2019-06-05 MED ORDER — HYDROXYPROGESTERONE CAPROATE 250 MG/ML IM OIL
250.0000 mg | TOPICAL_OIL | Freq: Once | INTRAMUSCULAR | Status: AC
  Administered 2019-06-05: 250 mg via INTRAMUSCULAR

## 2019-06-05 MED ORDER — OXYCODONE-ACETAMINOPHEN 10-325 MG PO TABS: 1.0000 | ORAL_TABLET | Freq: Three times a day (TID) | ORAL | 0 refills | Status: DC | PRN
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
## 2019-06-10 ENCOUNTER — Ambulatory Visit: Payer: Medicaid Other

## 2019-06-15 ENCOUNTER — Other Ambulatory Visit: Payer: Self-pay

## 2019-06-15 ENCOUNTER — Ambulatory Visit: Payer: Medicaid Other

## 2019-06-17 ENCOUNTER — Ambulatory Visit (INDEPENDENT_AMBULATORY_CARE_PROVIDER_SITE_OTHER): Payer: Medicaid Other | Admitting: Obstetrics & Gynecology

## 2019-06-17 ENCOUNTER — Encounter: Payer: Self-pay | Admitting: Obstetrics & Gynecology

## 2019-06-17 ENCOUNTER — Other Ambulatory Visit: Payer: Self-pay

## 2019-06-17 VITALS — BP 120/70 | Wt 195.0 lb

## 2019-06-17 DIAGNOSIS — O09213 Supervision of pregnancy with history of pre-term labor, third trimester: Secondary | ICD-10-CM

## 2019-06-17 DIAGNOSIS — Z8751 Personal history of pre-term labor: Secondary | ICD-10-CM

## 2019-06-17 DIAGNOSIS — Z3A34 34 weeks gestation of pregnancy: Secondary | ICD-10-CM

## 2019-06-17 DIAGNOSIS — O0990 Supervision of high risk pregnancy, unspecified, unspecified trimester: Secondary | ICD-10-CM

## 2019-06-17 LAB — POCT URINALYSIS DIPSTICK OB
Glucose, UA: NEGATIVE
POC,PROTEIN,UA: NEGATIVE

## 2019-06-17 MED ORDER — HYDROXYPROGESTERONE CAPROATE 250 MG/ML IM OIL
250.0000 mg | TOPICAL_OIL | Freq: Once | INTRAMUSCULAR | Status: AC
  Administered 2019-06-17: 250 mg via INTRAMUSCULAR

## 2019-06-17 NOTE — Progress Notes (Signed)
  Subjective  Fetal Movement? yes Contractions? No, occas B-Hs Leaking Fluid? no Vaginal Bleeding? yes  Objective  BP 120/70   Wt 195 lb (88.5 kg)   LMP 10/31/2018   BMI 34.54 kg/m  General: NAD Pumonary: no increased work of breathing Abdomen: gravid, non-tender Extremities: no edema Psychiatric: mood appropriate, affect full  Assessment  23 y.o. [id number] at [redacted]w[redacted]d by  07/27/2019, Alternate EDD Entry presenting for routine prenatal visit  Plan   Problem List Items Addressed This Visit      Other   Supervision of high risk pregnancy, antepartum    Other Visit Diagnoses    [redacted] weeks gestation of pregnancy    -  Primary   Relevant Orders   POC Urinalysis Dipstick OB (Completed)   History of pre-term labor       Relevant Orders   POC Urinalysis Dipstick OB (Completed)   US OB Follow Up      pregnancy5 Problems (from 10/31/18 to present)    Problem Noted Resolved   History of preterm delivery, currently pregnant 06/05/2019 by Will Bonnet, MD No   Supervision of high risk pregnancy, antepartum 12/25/2018 by Rexene Agent, CNM No   Overview Addendum 06/17/2019  4:20 PM by Gae Dry, MD    [facility] Prenatal Labs  Dating [place] Blood type: O/Positive/-- (05/27 1511)   Genetic Screen NIPS: nml XY Antibody:Negative (05/27 1511)  Anatomic [place] 19 wks Rubella: 1.30 (05/27 1511) Varicella: Immune  GTT Third trimester:  RPR: Non Reactive (05/27 1511)   Rhogam n/a HBsAg: Negative (05/27 1511)   TDaP vaccine    11/6      Flu Shot:decline HIV: Non Reactive (05/27 1511)   Baby Food  breast                          GBS:   Contraception  Interval BTL w hernia repair Pap: 11/2018 ASCUS, repeat pp  CBB  no   CS/VBAC n/a   Support Person husband    17OHP for PTL prevention           Makena weekly  [place] for growth nv, h/o macrosomia  GBS nv  Barnett Applebaum, MD, [place], [facility] 06/17/2019  4:33 PM

## 2019-06-20 ENCOUNTER — Encounter: Payer: Self-pay | Admitting: Obstetrics & Gynecology

## 2019-06-22 ENCOUNTER — Other Ambulatory Visit: Payer: Self-pay | Admitting: Advanced Practice Midwife

## 2019-06-22 DIAGNOSIS — M549 Dorsalgia, unspecified: Secondary | ICD-10-CM

## 2019-06-22 MED ORDER — CYCLOBENZAPRINE HCL 10 MG PO TABS: 10.0000 mg | ORAL_TABLET | Freq: Three times a day (TID) | ORAL | 1 refills | Status: DC | PRN

## 2019-06-22 NOTE — Telephone Encounter (Signed)
Please advise 

## 2019-06-22 NOTE — Progress Notes (Unsigned)
Flexeril order refilled.

## 2019-06-22 NOTE — Telephone Encounter (Signed)
Can you look at this since [facility] is not in the office until Friday

## 2019-06-24 ENCOUNTER — Other Ambulatory Visit: Payer: Self-pay

## 2019-06-24 ENCOUNTER — Ambulatory Visit: Payer: Medicaid Other

## 2019-06-24 ENCOUNTER — Ambulatory Visit (INDEPENDENT_AMBULATORY_CARE_PROVIDER_SITE_OTHER): Payer: Medicaid Other

## 2019-06-24 DIAGNOSIS — Z3A35 35 weeks gestation of pregnancy: Secondary | ICD-10-CM

## 2019-06-24 DIAGNOSIS — O09899 Supervision of other high risk pregnancies, unspecified trimester: Secondary | ICD-10-CM

## 2019-06-24 DIAGNOSIS — O09213 Supervision of pregnancy with history of pre-term labor, third trimester: Secondary | ICD-10-CM

## 2019-06-24 MED ORDER — HYDROXYPROGESTERONE CAPROATE 250 MG/ML IM OIL
250.0000 mg | TOPICAL_OIL | Freq: Once | INTRAMUSCULAR | Status: AC
  Administered 2019-06-24: 250 mg via INTRAMUSCULAR

## 2019-06-26 ENCOUNTER — Other Ambulatory Visit: Payer: Self-pay | Admitting: Obstetrics & Gynecology

## 2019-06-26 DIAGNOSIS — Z3A32 32 weeks gestation of pregnancy: Secondary | ICD-10-CM

## 2019-06-26 DIAGNOSIS — O0990 Supervision of high risk pregnancy, unspecified, unspecified trimester: Secondary | ICD-10-CM

## 2019-06-26 DIAGNOSIS — R1011 Right upper quadrant pain: Secondary | ICD-10-CM

## 2019-06-26 MED ORDER — OXYCODONE-ACETAMINOPHEN 10-325 MG PO TABS: 1.0000 | ORAL_TABLET | Freq: Three times a day (TID) | ORAL | 0 refills | Status: DC | PRN

## 2019-06-30 ENCOUNTER — Other Ambulatory Visit: Payer: Self-pay

## 2019-06-30 ENCOUNTER — Ambulatory Visit (INDEPENDENT_AMBULATORY_CARE_PROVIDER_SITE_OTHER): Payer: Medicaid Other | Admitting: Obstetrics & Gynecology

## 2019-06-30 ENCOUNTER — Encounter: Payer: Self-pay | Admitting: Obstetrics & Gynecology

## 2019-06-30 ENCOUNTER — Ambulatory Visit (INDEPENDENT_AMBULATORY_CARE_PROVIDER_SITE_OTHER): Payer: Medicaid Other

## 2019-06-30 ENCOUNTER — Other Ambulatory Visit (HOSPITAL_COMMUNITY)
Admission: RE | Admit: 2019-06-30 | Discharge: 2019-06-30 | Disposition: A | Discharge status: Home / Self Care | Payer: Medicaid Other | Source: Ambulatory Visit | Attending: Obstetrics & Gynecology | Admitting: Obstetrics & Gynecology

## 2019-06-30 VITALS — BP 120/70 | Temp 97.1°F | Wt 202.0 lb

## 2019-06-30 DIAGNOSIS — Z3685 Encounter for antenatal screening for Streptococcus B: Secondary | ICD-10-CM

## 2019-06-30 DIAGNOSIS — O09213 Supervision of pregnancy with history of pre-term labor, third trimester: Secondary | ICD-10-CM | POA: Diagnosis not present

## 2019-06-30 DIAGNOSIS — Z3A36 36 weeks gestation of pregnancy: Secondary | ICD-10-CM

## 2019-06-30 DIAGNOSIS — O09893 Supervision of other high risk pregnancies, third trimester: Secondary | ICD-10-CM

## 2019-06-30 DIAGNOSIS — Z113 Encounter for screening for infections with a predominantly sexual mode of transmission: Secondary | ICD-10-CM | POA: Insufficient documentation

## 2019-06-30 DIAGNOSIS — O09899 Supervision of other high risk pregnancies, unspecified trimester: Secondary | ICD-10-CM

## 2019-06-30 DIAGNOSIS — Z362 Encounter for other antenatal screening follow-up: Secondary | ICD-10-CM | POA: Diagnosis not present

## 2019-06-30 DIAGNOSIS — O0990 Supervision of high risk pregnancy, unspecified, unspecified trimester: Secondary | ICD-10-CM

## 2019-06-30 DIAGNOSIS — Z8751 Personal history of pre-term labor: Secondary | ICD-10-CM

## 2019-06-30 LAB — POCT URINALYSIS DIPSTICK OB: Glucose, UA: NEGATIVE

## 2019-06-30 MED ORDER — HYDROXYPROGESTERONE CAPROATE 250 MG/ML IM OIL
250.0000 mg | TOPICAL_OIL | Freq: Once | INTRAMUSCULAR | Status: AC
  Administered 2019-06-30: 16:00:00 250 mg via INTRAMUSCULAR

## 2019-06-30 MED ORDER — NITROFURANTOIN MONOHYD MACRO 100 MG PO CAPS: 100.0000 mg | ORAL_CAPSULE | Freq: Two times a day (BID) | ORAL | 0 refills | Status: DC

## 2019-06-30 NOTE — Progress Notes (Signed)
  Subjective  Fetal Movement? yes Contractions? yes Leaking Fluid? no Vaginal Bleeding? no Also has low back pain and pain related to hernia Objective  BP 120/70   Temp (!) 97.1 F (36.2 C)   Wt 202 lb (91.6 kg)   LMP 10/31/2018   BMI 35.78 kg/m  General: NAD Pumonary: no increased work of breathing Abdomen: gravid, non-tender Extremities: no edema Psychiatric: mood appropriate, affect full  Assessment  23 y.o. [id number] at [redacted]w[redacted]d by  07/27/2019, Alternate EDD Entry presenting for routine prenatal visit  Plan   Problem List Items Addressed This Visit      Other   Supervision of high risk pregnancy, antepartum   History of preterm delivery, currently pregnant - Primary    Other Visit Diagnoses    [redacted] weeks gestation of pregnancy       Relevant Orders   POC Urinalysis Dipstick OB (Completed)   Encounter for antenatal screening for Streptococcus B       Relevant Orders   Culture, beta strep (group b only)   Screen for STD (sexually transmitted disease)       Relevant Orders   GC/Chlamydia probe amp ([place])      pregnancy5 Problems (from 10/31/18 to present)    Problem Noted Resolved   History of preterm delivery, currently pregnant 06/05/2019 by Will Bonnet, MD No   Supervision of high risk pregnancy, antepartum 12/25/2018 by Rexene Agent, CNM No   Overview Addendum 06/17/2019  4:20 PM by Gae Dry, MD    Clinic [facility] Prenatal Labs  Dating [place] Blood type: O/Positive/-- (05/27 1511)   Genetic Screen NIPS: nml XY Antibody:Negative (05/27 1511)  Anatomic [place] 19 wks Rubella: 1.30 (05/27 1511) Varicella: Immune  GTT Third trimester:  RPR: Non Reactive (05/27 1511)   Rhogam n/a HBsAg: Negative (05/27 1511)   TDaP vaccine    11/6      Flu Shot:decline HIV: Non Reactive (05/27 1511)   Baby Food  breast                          GBS: 12/15  Contraception  Interval BTL w hernia repair Pap: 11/2018 ASCUS, repeat pp  CBB  no   CS/VBAC n/a   Support Person  husband    17OHP for PTL prevention           IOL pros and cons discussed related to the pain w hernia and back she has been having, including need and use of narcotics for control, with its known risks  38 weeks on 12/29 planned  Monitor for labor sx's prior to that time  Macrobid for current UTI  Barnett Applebaum, MD, [facility], [facility] 06/30/2019  5:03 PM

## 2019-07-01 ENCOUNTER — Encounter: Payer: Medicaid Other | Admitting: Advanced Practice Midwife

## 2019-07-01 ENCOUNTER — Other Ambulatory Visit: Payer: Medicaid Other

## 2019-07-02 LAB — GC/CHLAMYDIA PROBE AMP (~~LOC~~) NOT AT ARMC
Chlamydia: NEGATIVE
Comment: NEGATIVE
Comment: NORMAL
Neisseria Gonorrhea: NEGATIVE

## 2019-07-05 LAB — CULTURE, BETA STREP (GROUP B ONLY): Strep Gp B Culture: NEGATIVE

## 2019-07-07 ENCOUNTER — Ambulatory Visit (INDEPENDENT_AMBULATORY_CARE_PROVIDER_SITE_OTHER): Payer: Medicaid Other | Admitting: Obstetrics & Gynecology

## 2019-07-07 ENCOUNTER — Encounter: Payer: Self-pay | Admitting: Obstetrics & Gynecology

## 2019-07-07 ENCOUNTER — Other Ambulatory Visit: Payer: Self-pay

## 2019-07-07 VITALS — BP 120/80 | Wt 201.0 lb

## 2019-07-07 DIAGNOSIS — O0993 Supervision of high risk pregnancy, unspecified, third trimester: Secondary | ICD-10-CM

## 2019-07-07 DIAGNOSIS — O09899 Supervision of other high risk pregnancies, unspecified trimester: Secondary | ICD-10-CM

## 2019-07-07 DIAGNOSIS — O09893 Supervision of other high risk pregnancies, third trimester: Secondary | ICD-10-CM

## 2019-07-07 DIAGNOSIS — Z3A37 37 weeks gestation of pregnancy: Secondary | ICD-10-CM

## 2019-07-07 DIAGNOSIS — O0990 Supervision of high risk pregnancy, unspecified, unspecified trimester: Secondary | ICD-10-CM

## 2019-07-07 NOTE — Progress Notes (Signed)
History and Physical  Molly Bray is a 23 y.o. [id number] [redacted]w[redacted]d  for Induction of Labor scheduled due to Favorable cervix at term and pain related to umb hernia .   See labor record for pregnancy highlights.  No recent pain, bleeding, ruptured membranes, or other signs of progressing labor.  She utilized [facility] due to prior h/o PTL/ PTD.  SHe has umbilical hernia causing frequent bouts of pain; planning hernia repair at time of tubal in post partum time period.  PMHx: She  has a past medical history of Anxiety, Asthma, Chronic kidney disease, Depression, GERD (gastroesophageal reflux disease), Headache, History of methicillin resistant staphylococcus aureus (MRSA) (2008), IBS (irritable bowel syndrome), Pinched nerve, Pyelonephritis, and Recurrent UTI. Also,  has a past surgical history that includes Kidney surgery and Cholecystectomy (N/A, 03/07/2018)., family history includes COPD in her father and mother; Diabetes in her maternal aunt.,  reports that she has been smoking cigarettes. She has a 4.00 pack-year smoking history. She has never used smokeless tobacco. She reports that she does not drink alcohol or use drugs. She has a current medication list which includes the following prescription(s): albuterol, cyclobenzaprine, esomeprazole, hydroxyprogesterone caproate, nitrofurantoin (macrocrystal-monohydrate), oxycodone-acetaminophen, and prenatal vit-fe fumarate-fa. Also, is allergic to amoxicillin; vancomycin; penicillins; and silver sulfadiazine. OB History  Gravida Para Term Preterm AB Living  5 2 1 1 2 2   SAB TAB Ectopic Multiple Live Births  2     0 2    # Outcome Date GA Lbr Len/2nd Weight Sex Delivery Anes PTL Lv  5 Current           4 Term 08/12/17 [redacted]w[redacted]d 04:00 / 00:42 7 lb 8.6 oz (3.42 kg) M Vag-Spont EPI  LIV  3 Preterm 01/06/13 [redacted]w[redacted]d  5 lb 4 oz (2.381 kg) M Vag-Spont  Y LIV  2 SAB           1 SAB           Patient denies any other pertinent gynecologic issues.   Review of  Systems  Constitutional: Negative for chills, fever and malaise/fatigue.  HENT: Negative for congestion, sinus pain and sore throat.   Eyes: Negative for blurred vision and pain.  Respiratory: Negative for cough and wheezing.   Cardiovascular: Negative for chest pain and leg swelling.  Gastrointestinal: Positive for abdominal pain. Negative for constipation, diarrhea, heartburn, nausea and vomiting.  Genitourinary: Negative for dysuria, frequency, hematuria and urgency.  Musculoskeletal: Negative for back pain, joint pain, myalgias and neck pain.  Skin: Negative for itching and rash.  Neurological: Negative for dizziness, tremors and weakness.  Endo/Heme/Allergies: Does not bruise/bleed easily.  Psychiatric/Behavioral: Negative for depression. The patient is not nervous/anxious and does not have insomnia.     Objective: BP 120/80   Wt 201 lb (91.2 kg)   LMP 10/31/2018   BMI 35.61 kg/m  Physical Exam Constitutional:      General: She is not in acute distress.    Appearance: She is well-developed.  HENT:     Head: Normocephalic and atraumatic. No laceration.     Right Ear: Hearing normal.     Left Ear: Hearing normal.     Mouth/Throat:     Pharynx: Uvula midline.  Eyes:     Pupils: Pupils are equal, round, and reactive to light.  Neck:     Thyroid: No thyromegaly.  Cardiovascular:     Rate and Rhythm: Normal rate and regular rhythm.     Heart sounds: No murmur. No friction rub.  No gallop.   Pulmonary:     Effort: Pulmonary effort is normal. No respiratory distress.     Breath sounds: Normal breath sounds. No wheezing.  Chest:     Breasts:        Right: No mass, skin change or tenderness.        Left: No mass, skin change or tenderness.  Abdominal:     General: Bowel sounds are normal. There is no distension.     Palpations: Abdomen is soft.     Tenderness: There is no abdominal tenderness. There is no rebound.  Musculoskeletal:        General: Normal range of motion.      Cervical back: Normal range of motion and neck supple.  Neurological:     Mental Status: She is alert and oriented to person, place, and time.     Cranial Nerves: No cranial nerve deficit.  Skin:    General: Skin is warm and dry.  Psychiatric:        Judgment: Judgment normal.  Vitals reviewed.   FHT 140s  Assessment: Term Pregnancy for Induction of Labor due to Favorable cervix at term and pain related to hernia.  Plan: Patient will undergo induction of labor with cervical ripening agents.     Patient has been fully informed of the pros and cons, risks and benefits of continued observation with fetal monitoring versus that of induction of labor.   She understands that there are uncommon risks to induction, which include but are not limited to : frequent or prolonged uterine contractions, fetal distress, uterine rupture, and lack of successful induction.  These risks include all methods including Pitocin and Misoprostol and Cervadil.  Patient understands that using Misoprostol for labor induction is an "off label" indication although it has been studied extensively for this purpose and is an accepted method of induction.  She also has been informed of the increased risks for Cesarean with induction and should induction not be successful.  Patient consents to the induction plan of management.  Plans to breast feed Plans bilateral tubal ligation for contraception TDaP UTD  Annamarie Major, MD, [facility], [facility] 07/07/2019  3:38 PM

## 2019-07-09 ENCOUNTER — Other Ambulatory Visit
Admission: RE | Admit: 2019-07-09 | Discharge: 2019-07-09 | Disposition: A | Discharge status: Home / Self Care | Payer: Medicaid Other | Source: Ambulatory Visit | Attending: Obstetrics & Gynecology | Admitting: Obstetrics & Gynecology

## 2019-07-09 DIAGNOSIS — Z01812 Encounter for preprocedural laboratory examination: Secondary | ICD-10-CM | POA: Insufficient documentation

## 2019-07-09 DIAGNOSIS — Z20828 Contact with and (suspected) exposure to other viral communicable diseases: Secondary | ICD-10-CM | POA: Insufficient documentation

## 2019-07-09 LAB — SARS CORONAVIRUS 2 (TAT 6-24 HRS): SARS Coronavirus 2: NEGATIVE

## 2019-07-13 ENCOUNTER — Other Ambulatory Visit: Payer: Self-pay

## 2019-07-13 ENCOUNTER — Inpatient Hospital Stay
Admission: EM | Admit: 2019-07-13 | Discharge: 2019-07-15 | DRG: 807 | Disposition: A | Discharge status: Home / Self Care | Payer: Medicaid Other | Attending: Obstetrics & Gynecology | Admitting: Obstetrics & Gynecology

## 2019-07-13 ENCOUNTER — Encounter: Payer: Self-pay | Admitting: Obstetrics and Gynecology

## 2019-07-13 DIAGNOSIS — O0990 Supervision of high risk pregnancy, unspecified, unspecified trimester: Secondary | ICD-10-CM

## 2019-07-13 DIAGNOSIS — Z3A38 38 weeks gestation of pregnancy: Secondary | ICD-10-CM

## 2019-07-14 ENCOUNTER — Encounter: Payer: Self-pay | Admitting: Obstetrics & Gynecology

## 2019-07-14 ENCOUNTER — Inpatient Hospital Stay: Payer: Medicaid Other | Admitting: Registered Nurse

## 2019-07-14 DIAGNOSIS — Z3A38 38 weeks gestation of pregnancy: Secondary | ICD-10-CM

## 2019-07-14 DIAGNOSIS — O26893 Other specified pregnancy related conditions, third trimester: Secondary | ICD-10-CM | POA: Diagnosis present

## 2019-07-14 LAB — CBC
HCT: 36.6 % (ref 36.0–46.0)
Hemoglobin: 12.4 g/dL (ref 12.0–15.0)
MCH: 30.8 pg (ref 26.0–34.0)
MCHC: 33.9 g/dL (ref 30.0–36.0)
MCV: 91 fL (ref 80.0–100.0)
Platelets: 242 10*3/uL (ref 150–400)
RBC: 4.02 MIL/uL (ref 3.87–5.11)
RDW: 13.2 % (ref 11.5–15.5)
WBC: 12.7 10*3/uL — ABNORMAL HIGH (ref 4.0–10.5)
nRBC: 0 % (ref 0.0–0.2)

## 2019-07-14 LAB — TYPE AND SCREEN
ABO/RH(D): O POS
Antibody Screen: NEGATIVE

## 2019-07-14 LAB — RPR: RPR Ser Ql: NONREACTIVE

## 2019-07-14 LAB — RUPTURE OF MEMBRANE (ROM)PLUS: Rom Plus: NEGATIVE

## 2019-07-14 MED ORDER — SIMETHICONE 80 MG PO CHEW: 80.0000 mg | CHEWABLE_TABLET | ORAL | Status: DC | PRN

## 2019-07-14 MED ORDER — EPHEDRINE 5 MG/ML INJ: 10.0000 mg | INTRAVENOUS | Status: DC | PRN

## 2019-07-14 MED ORDER — OXYTOCIN BOLUS FROM INFUSION
500.0000 mL | Freq: Once | INTRAVENOUS | Status: AC
  Administered 2019-07-14: 500 mL via INTRAVENOUS

## 2019-07-14 MED ORDER — LIDOCAINE HCL (PF) 1 % IJ SOLN
30.0000 mL | INTRAMUSCULAR | Status: AC | PRN
  Administered 2019-07-14: 5 mL via SUBCUTANEOUS

## 2019-07-14 MED ORDER — ONDANSETRON HCL 4 MG PO TABS: 4.0000 mg | ORAL_TABLET | ORAL | Status: DC | PRN

## 2019-07-14 MED ORDER — OXYTOCIN 40 UNITS IN NORMAL SALINE INFUSION - SIMPLE MED
1.0000 m[IU]/min | INTRAVENOUS | Status: DC
  Administered 2019-07-14: 2 m[IU]/min via INTRAVENOUS

## 2019-07-14 MED ORDER — ONDANSETRON HCL 4 MG/2ML IJ SOLN
4.0000 mg | Freq: Four times a day (QID) | INTRAMUSCULAR | Status: DC | PRN
  Administered 2019-07-14: 4 mg via INTRAVENOUS
  Filled 2019-07-14: qty 2

## 2019-07-14 MED ORDER — OXYCODONE-ACETAMINOPHEN 5-325 MG PO TABS
2.0000 | ORAL_TABLET | ORAL | Status: DC | PRN
  Administered 2019-07-15 (×2): 2 via ORAL
  Filled 2019-07-14 (×2): qty 2

## 2019-07-14 MED ORDER — LACTATED RINGERS IV SOLN: 500.0000 mL | INTRAVENOUS | Status: DC | PRN

## 2019-07-14 MED ORDER — DIBUCAINE (PERIANAL) 1 % EX OINT
1.0000 "application " | TOPICAL_OINTMENT | CUTANEOUS | Status: DC | PRN
  Administered 2019-07-14: 1 via RECTAL
  Filled 2019-07-14: qty 28

## 2019-07-14 MED ORDER — ACETAMINOPHEN 325 MG PO TABS
650.0000 mg | ORAL_TABLET | ORAL | Status: DC | PRN
  Administered 2019-07-14: 650 mg via ORAL
  Filled 2019-07-14 (×2): qty 2

## 2019-07-14 MED ORDER — TERBUTALINE SULFATE 1 MG/ML IJ SOLN: 0.2500 mg | Freq: Once | INTRAMUSCULAR | Status: DC | PRN

## 2019-07-14 MED ORDER — FENTANYL 2.5 MCG/ML W/ROPIVACAINE 0.15% IN NS 100 ML EPIDURAL (ARMC)
EPIDURAL | Status: AC
  Filled 2019-07-14: qty 100

## 2019-07-14 MED ORDER — FENTANYL-BUPIVACAINE-NACL 0.5-0.125-0.9 MG/250ML-% EP SOLN: 12.0000 mL/h | EPIDURAL | Status: DC | PRN

## 2019-07-14 MED ORDER — COCONUT OIL OIL: 1.0000 "application " | TOPICAL_OIL | Status: DC | PRN

## 2019-07-14 MED ORDER — ACETAMINOPHEN 325 MG PO TABS
ORAL_TABLET | ORAL | Status: AC
  Administered 2019-07-14: 650 mg via ORAL
  Filled 2019-07-14: qty 2

## 2019-07-14 MED ORDER — MISOPROSTOL 200 MCG PO TABS
ORAL_TABLET | ORAL | Status: AC
  Filled 2019-07-14: qty 4

## 2019-07-14 MED ORDER — DIPHENHYDRAMINE HCL 50 MG/ML IJ SOLN: 12.5000 mg | INTRAMUSCULAR | Status: DC | PRN

## 2019-07-14 MED ORDER — OXYCODONE-ACETAMINOPHEN 5-325 MG PO TABS
1.0000 | ORAL_TABLET | ORAL | Status: DC | PRN
  Administered 2019-07-14 – 2019-07-15 (×3): 1 via ORAL
  Filled 2019-07-14 (×3): qty 1

## 2019-07-14 MED ORDER — LACTATED RINGERS IV SOLN: INTRAVENOUS | Status: DC

## 2019-07-14 MED ORDER — SENNOSIDES-DOCUSATE SODIUM 8.6-50 MG PO TABS
2.0000 | ORAL_TABLET | ORAL | Status: DC
  Administered 2019-07-14: 2 via ORAL
  Filled 2019-07-14: qty 2

## 2019-07-14 MED ORDER — LACTATED RINGERS IV SOLN: 500.0000 mL | Freq: Once | INTRAVENOUS | Status: DC

## 2019-07-14 MED ORDER — ZOLPIDEM TARTRATE 5 MG PO TABS: 5.0000 mg | ORAL_TABLET | Freq: Every evening | ORAL | Status: DC | PRN

## 2019-07-14 MED ORDER — LIDOCAINE-EPINEPHRINE (PF) 1.5 %-1:200000 IJ SOLN
INTRAMUSCULAR | Status: DC | PRN
  Administered 2019-07-14: 3 mL via PERINEURAL

## 2019-07-14 MED ORDER — ACETAMINOPHEN 325 MG PO TABS: 650.0000 mg | ORAL_TABLET | ORAL | Status: DC | PRN

## 2019-07-14 MED ORDER — FENTANYL 2.5 MCG/ML W/ROPIVACAINE 0.15% IN NS 100 ML EPIDURAL (ARMC)
12.0000 mL/h | EPIDURAL | Status: DC
  Administered 2019-07-14: 09:00:00 12 mL/h via EPIDURAL

## 2019-07-14 MED ORDER — MISOPROSTOL 25 MCG QUARTER TABLET
25.0000 ug | ORAL_TABLET | ORAL | Status: DC | PRN
  Administered 2019-07-14: 25 ug via VAGINAL
  Filled 2019-07-14 (×2): qty 1

## 2019-07-14 MED ORDER — AMMONIA AROMATIC IN INHA
RESPIRATORY_TRACT | Status: AC
  Filled 2019-07-14: qty 10

## 2019-07-14 MED ORDER — CLINDAMYCIN PHOSPHATE 900 MG/50ML IV SOLN
INTRAVENOUS | Status: AC
  Administered 2019-07-14: 900 mg via INTRAVENOUS
  Filled 2019-07-14: qty 50

## 2019-07-14 MED ORDER — DIPHENHYDRAMINE HCL 25 MG PO CAPS: 25.0000 mg | ORAL_CAPSULE | Freq: Four times a day (QID) | ORAL | Status: DC | PRN

## 2019-07-14 MED ORDER — LIDOCAINE HCL (PF) 1 % IJ SOLN
INTRAMUSCULAR | Status: AC
  Filled 2019-07-14: qty 30

## 2019-07-14 MED ORDER — CLINDAMYCIN PHOSPHATE 900 MG/50ML IV SOLN
900.0000 mg | Freq: Three times a day (TID) | INTRAVENOUS | Status: DC
  Administered 2019-07-14: 900 mg via INTRAVENOUS
  Filled 2019-07-14: qty 50

## 2019-07-14 MED ORDER — BUPIVACAINE HCL (PF) 0.25 % IJ SOLN
INTRAMUSCULAR | Status: DC | PRN
  Administered 2019-07-14: 4 mL via EPIDURAL
  Administered 2019-07-14: 3 mL via EPIDURAL

## 2019-07-14 MED ORDER — BENZOCAINE-MENTHOL 20-0.5 % EX AERO
1.0000 "application " | INHALATION_SPRAY | CUTANEOUS | Status: DC | PRN
  Filled 2019-07-14: qty 56

## 2019-07-14 MED ORDER — OXYTOCIN 40 UNITS IN NORMAL SALINE INFUSION - SIMPLE MED
2.5000 [IU]/h | INTRAVENOUS | Status: DC
  Filled 2019-07-14: qty 1000

## 2019-07-14 MED ORDER — ONDANSETRON HCL 4 MG/2ML IJ SOLN: 4.0000 mg | INTRAMUSCULAR | Status: DC | PRN

## 2019-07-14 MED ORDER — PHENYLEPHRINE 40 MCG/ML (10ML) SYRINGE FOR IV PUSH (FOR BLOOD PRESSURE SUPPORT): 80.0000 ug | PREFILLED_SYRINGE | INTRAVENOUS | Status: DC | PRN

## 2019-07-14 MED ORDER — OXYTOCIN 10 UNIT/ML IJ SOLN
INTRAMUSCULAR | Status: AC
  Filled 2019-07-14: qty 2

## 2019-07-14 MED ORDER — IBUPROFEN 600 MG PO TABS
ORAL_TABLET | ORAL | Status: AC
  Administered 2019-07-14: 600 mg via ORAL
  Filled 2019-07-14: qty 1

## 2019-07-14 MED ORDER — BUTORPHANOL TARTRATE 1 MG/ML IJ SOLN
1.0000 mg | INTRAMUSCULAR | Status: DC | PRN
  Administered 2019-07-14: 1 mg via INTRAVENOUS
  Filled 2019-07-14: qty 1

## 2019-07-14 MED ORDER — WITCH HAZEL-GLYCERIN EX PADS
1.0000 "application " | MEDICATED_PAD | CUTANEOUS | Status: DC | PRN
  Administered 2019-07-14: 1 via TOPICAL
  Filled 2019-07-14: qty 100

## 2019-07-14 MED ORDER — IBUPROFEN 600 MG PO TABS
600.0000 mg | ORAL_TABLET | Freq: Four times a day (QID) | ORAL | Status: DC
  Administered 2019-07-14 – 2019-07-15 (×3): 600 mg via ORAL
  Filled 2019-07-14 (×3): qty 1

## 2019-07-14 NOTE — Anesthesia Procedure Notes (Deleted)
Epidural Patient location during procedure: OB  Staffing Anesthesiologist: Piscitello, Precious Haws, MD Performed: anesthesiologist   Preanesthetic Checklist Completed: patient identified, IV checked, site marked, risks and benefits discussed, surgical consent, monitors and equipment checked, pre-op evaluation and timeout performed  Epidural Patient position: sitting Prep: ChloraPrep Patient monitoring: heart rate, continuous pulse ox and blood pressure Approach: midline Location: L3-L4 Injection technique: LOR saline  Needle:  Needle type: Tuohy  Needle gauge: 17 G Needle length: 9 cm and 9 Catheter type: closed end flexible Catheter size: 19 Gauge Test dose: negative and 1.5% lidocaine with Epi 1:200 K  Assessment Sensory level: T10 Events: blood not aspirated, injection not painful, no injection resistance, no paresthesia and negative IV test  Additional Notes Reason for block:procedure for pain

## 2019-07-14 NOTE — Discharge Summary (Signed)
OB Discharge Summary     Patient Name: Molly Bray DOB: 1996-03-15 MRN: [id number]  Date of admission: 07/13/2019 Delivering MD: Letitia Libra, MD  Date of Delivery: 07/14/2019  Date of discharge: 07/15/2019  Admitting diagnosis: Normal labor and delivery [O80] Intrauterine pregnancy: [redacted]w[redacted]d     Secondary diagnosis: None     Discharge diagnosis: Term Pregnancy Delivered, No other diagnosis                         Hospital course:  Induction of Labor With Vaginal Delivery   23 y.o. yo [id number] at 101w1d was admitted to the hospital 07/13/2019 for induction of labor.  Indication for induction: Favorable cervix at term.  Patient had an uncomplicated labor course as follows: Membrane Rupture Time/Date: 12:26 PM ,07/14/2019   Intrapartum Procedures: Episiotomy: None [1]                                         Lacerations:  Perineal [11];2nd degree [3]  Patient had delivery of a Viable infant.  Information for the patient's newborn:  Jeanene, Mena [[id number]]  Delivery Method: Vag-Spont    07/14/2019  Details of delivery can be found in separate delivery note.  Patient had a routine postpartum course. Patient is discharged home 07/15/19.                                                                 Post partum procedures:none  Complications: None  Physical exam on 07/15/2019: Vitals:   07/14/19 1730 07/14/19 2015 07/14/19 2339 07/15/19 0825  BP: 111/88 120/61 98/63 110/80  Pulse: 70 76 88 75  Resp: 18 18 20 20   Temp: 98.7 F (37.1 C) 98.9 F (37.2 C) 98.5 F (36.9 C) 98.6 F (37 C)  TempSrc: Oral Oral Oral Oral  SpO2: 99% 98% 99% 100%  Weight:      Height:       General: alert, cooperative and no distress Lochia: appropriate Uterine Fundus: firm Incision: N/A DVT Evaluation: No evidence of DVT seen on physical exam. No cords or calf tenderness. No significant calf/ankle edema.  Labs: Lab Results  Component Value Date   WBC 14.4 (H)  07/15/2019   HGB 10.2 (L) 07/15/2019   HCT 29.7 (L) 07/15/2019   MCV 90.8 07/15/2019   PLT 216 07/15/2019   CMP Latest Ref Rng & Units 04/23/2019  Glucose 70 - 99 mg/dL 96  BUN 6 - 20 mg/dL 9  Creatinine 06/23/2019 - 3.76 mg/dL 2.83)  Sodium 1.51(V - 616 mmol/L 136  Potassium 3.5 - 5.1 mmol/L 3.7  Chloride 98 - 111 mmol/L 105  CO2 22 - 32 mmol/L 23  Calcium 8.9 - 10.3 mg/dL 9.4  Total Protein 6.5 - 8.1 g/dL 6.3(L)  Total Bilirubin 0.3 - 1.2 mg/dL 0.3  Alkaline Phos 38 - 126 U/L 84  AST 15 - 41 U/L 16  ALT 0 - 44 U/L 10    Discharge instruction: per After Visit Summary.  Medications:  Allergies as of 07/15/2019      Reactions   Amoxicillin Anaphylaxis   Vancomycin Itching   Patient with obvious "  redmans" syndrome   Penicillins Swelling      Silver Sulfadiazine Other (See Comments)   Pain/burning sensation      Medication List    STOP taking these medications   albuterol 108 (90 Base) MCG/ACT inhaler Commonly known as: VENTOLIN HFA   esomeprazole 20 MG capsule Commonly known as: NexIUM   hydroxyprogesterone caproate 250 mg/mL Oil injection Commonly known as: MAKENA   nitrofurantoin (macrocrystal-monohydrate) 100 MG capsule Commonly known as: Macrobid   oxyCODONE-acetaminophen 10-325 MG tablet Commonly known as: Percocet Replaced by: oxyCODONE-acetaminophen 5-325 MG tablet     TAKE these medications   cyclobenzaprine 10 MG tablet Commonly known as: FLEXERIL Take 1 tablet (10 mg total) by mouth every 8 (eight) hours as needed for muscle spasms.   ibuprofen 600 MG tablet Commonly known as: ADVIL Take 1 tablet (600 mg total) by mouth every 6 (six) hours.   oxyCODONE-acetaminophen 5-325 MG tablet Commonly known as: PERCOCET/ROXICET Take 1 tablet by mouth every 4 (four) hours as needed (breakthrough pain). Replaces: oxyCODONE-acetaminophen 10-325 MG tablet   PRENATAL VITAMIN PO Take by mouth.            Discharge Care Instructions  (From admission,  onward)         Start     Ordered   07/15/19 0000  Discharge wound care:    Comments: Perform wound care instructions   07/15/19 1223          Diet: routine diet  Activity: Advance as tolerated. Pelvic rest for 6 weeks.   Outpatient follow up: Follow-up Information    Gae Dry, MD. Schedule an appointment as soon as possible for a visit in 3 week(s).   Specialty: Obstetrics and Gynecology Contact information: [address] [place] [address] [phone]             Postpartum contraception: Plans Interval BTL Rhogam Given postpartum: no Rubella vaccine given postpartum: no Varicella vaccine given postpartum: no TDaP given antepartum or postpartum: Yes  Newborn Data: Live born female  Birth Weight:   APGAR: ,   Newborn Delivery   Birth date/time: 07/14/2019 12:55:00 Delivery type: Vaginal, Spontaneous       Baby Feeding: Breast  Disposition:home with mother  SIGNED: Prentice Docker, MD 07/15/2019 12:23 PM

## 2019-07-14 NOTE — Anesthesia Procedure Notes (Addendum)
Epidural Patient location during procedure: OB Start time: 07/14/2019 8:07 AM End time: 07/14/2019 8:22 AM  Staffing Anesthesiologist: Emmie Niemann, MD Resident/CRNA: Caryl Asp, CRNA Other anesthesia staff: Lia Foyer, CRNA Performed: resident/CRNA   Preanesthetic Checklist Completed: patient identified, IV checked, site marked, risks and benefits discussed, surgical consent, monitors and equipment checked, pre-op evaluation and timeout performed  Epidural Patient position: sitting Prep: ChloraPrep Patient monitoring: heart rate, continuous pulse ox, blood pressure and cardiac monitor Approach: midline Location: L3-L4 Injection technique: LOR air  Needle:  Needle type: Tuohy  Needle gauge: 17 G Needle length: 9 cm Needle insertion depth: 7 cm Catheter type: closed end flexible Catheter size: 19 Gauge Catheter at skin depth: 12 cm Test dose: negative and 1.5% lidocaine with Epi 1:200 K  Additional Notes 2 attempt Pt. Evaluated and documentation done after procedure finished. Patient identified. Risks/Benefits/Options discussed with patient including but not limited to bleeding, infection, nerve damage, paralysis, failed block, incomplete pain control, headache, blood pressure changes, nausea, vomiting, reactions to medication both or allergic, itching and postpartum back pain. Confirmed with bedside nurse the patient's most recent platelet count. Confirmed with patient that they are not currently taking any anticoagulation, have any bleeding history or any family history of bleeding disorders. Patient expressed understanding and wished to proceed. All questions were answered. Sterile technique was used throughout the entire procedure. Please see nursing notes for vital signs. Test dose was given through epidural catheter and negative prior to continuing to dose epidural or start infusion. Warning signs of high block given to the patient including shortness of breath,  tingling/numbness in hands, complete motor block, or any concerning symptoms with instructions to call for help. Patient was given instructions on fall risk and not to get out of bed. All questions and concerns addressed with instructions to call with any issues or inadequate analgesia.   Patient tolerated the insertion well without immediate complications.Reason for block:procedure for pain

## 2019-07-14 NOTE — Progress Notes (Signed)
  Labor Progress Note   23 y.o. [id number] @ [redacted]w[redacted]d , admitted for  Pregnancy, Labor Management.   Subjective:  Some pain despite epidural Passage of clot during exam.  Objective:  BP 121/87   Pulse (!) 116   Temp 98.1 F (36.7 C) (Oral)   Resp 18   Ht 5\' 3"  (1.6 m)   Wt 91.6 kg   LMP 10/31/2018   SpO2 100%   BMI 35.78 kg/m  Abd: gravid, ND, FHT present, moderate tenderness on exam Extr: trace to 1+ bilateral pedal edema SVE: CERVIX: 7 cm dilated, 90 effaced, -2 station, presenting part VTX MEMBRANES: ruptured  EFM: FHR: 110 bpm, variability: moderate,  accelerations:  Present,  decelerations:  Present She has been having some early decels Toco: Frequency: Every 2-3 minutes Labs: I have reviewed the patient's lab results.   Assessment & Plan:  [id number] @ [redacted]w[redacted]d, admitted for  Pregnancy and Labor/Delivery Management  1. Pain management: epidural. 2. FWB: FHT category 1.  3. ID: GBS negative 4. Labor management: AROM now, cont Pitocin; no further bleeding; anticpate progression to second stage soon  All discussed with patient, see orders  Barnett Applebaum, MD, [facility], [facility] 07/14/2019  12:41 PM

## 2019-07-14 NOTE — Anesthesia Preprocedure Evaluation (Signed)
Anesthesia Evaluation  Patient identified by MRN, date of birth, ID band Patient awake    Reviewed: Allergy & Precautions, H&P , NPO status , Patient's Chart, lab work & pertinent test results  Airway Mallampati: III  TM Distance: >3 FB Neck ROM: full    Dental no notable dental hx.    Pulmonary asthma , Current Smoker and Patient abstained from smoking.,    Pulmonary exam normal        Cardiovascular negative cardio ROS Normal cardiovascular exam     Neuro/Psych  Headaches, Anxiety Depression    GI/Hepatic Neg liver ROS, GERD  Controlled and Medicated,  Endo/Other  negative endocrine ROS  Renal/GU Renal disease  negative genitourinary   Musculoskeletal   Abdominal   Peds  Hematology negative hematology ROS (+)   Anesthesia Other Findings   Reproductive/Obstetrics (+) Pregnancy                             Anesthesia Physical Anesthesia Plan  ASA: II  Anesthesia Plan: Epidural   Post-op Pain Management:    Induction:   PONV Risk Score and Plan:   Airway Management Planned:   Additional Equipment:   Intra-op Plan:   Post-operative Plan:   Informed Consent: I have reviewed the patients History and Physical, chart, labs and discussed the procedure including the risks, benefits and alternatives for the proposed anesthesia with the patient or authorized representative who has indicated his/her understanding and acceptance.       Plan Discussed with: CRNA and Anesthesiologist  Anesthesia Plan Comments:         Anesthesia Quick Evaluation

## 2019-07-14 NOTE — H&P (Signed)
History and Physical Update  Patient arrived to unit around midnight with complaint of rupture of membranes. Both ROM Plus and Ferning were negative. Patient stayed for induction since she was scheduled for induction at 5 AM this morning.   Her H&P has been reviewed and updated. Patient is stable for induction.  She was 2.5/60-70/-2 when she arrived to the unit and received one dose of cytotec.   Vital Signs: BP 109/71 (BP Location: Left Arm)   Pulse 73   Temp 98.1 F (36.7 C) (Oral)   Resp 18   Ht 5\' 3"  (1.6 m)   Wt 91.6 kg   LMP 10/31/2018   BMI 35.78 kg/m  Constitutional: Well nourished, well developed female in no acute distress.  HEENT: normal Skin: Warm and dry.  Cardiovascular: Regular rate and rhythm.   Extremity: no edema  Respiratory: Clear to auscultation bilateral. Normal respiratory effort Abdomen: FHT present Back: no CVAT Neuro: DTRs 2+, Cranial nerves grossly intact Psych: Alert and Oriented x3. No memory deficits. Normal mood and affect.  MS: normal gait, normal bilateral lower extremity ROM/strength/stability.  Pelvic exam: (female chaperone present) is not limited by body habitus EGBUS: within normal limits Vagina: within normal limits and with normal mucosa  Cervix: 3.5/70/-2  Toco: every 1-4 minutes Fetal Well Being: 130 bpm, moderate variability, +accelerations, -decelerations  23 yo G5 P1122 with induction of labor Start Pitocin  Rod Can, CNM

## 2019-07-14 NOTE — Progress Notes (Signed)
Pt insisted on going outside to smoke. Pt educated on risk, verbalized understating. Went anyway.

## 2019-07-15 LAB — CBC
HCT: 29.7 % — ABNORMAL LOW (ref 36.0–46.0)
Hemoglobin: 10.2 g/dL — ABNORMAL LOW (ref 12.0–15.0)
MCH: 31.2 pg (ref 26.0–34.0)
MCHC: 34.3 g/dL (ref 30.0–36.0)
MCV: 90.8 fL (ref 80.0–100.0)
Platelets: 216 10*3/uL (ref 150–400)
RBC: 3.27 MIL/uL — ABNORMAL LOW (ref 3.87–5.11)
RDW: 13.5 % (ref 11.5–15.5)
WBC: 14.4 10*3/uL — ABNORMAL HIGH (ref 4.0–10.5)
nRBC: 0 % (ref 0.0–0.2)

## 2019-07-15 MED ORDER — OXYCODONE-ACETAMINOPHEN 5-325 MG PO TABS: 1.0000 | ORAL_TABLET | ORAL | 0 refills | Status: DC | PRN

## 2019-07-15 MED ORDER — IBUPROFEN 600 MG PO TABS: 600.0000 mg | ORAL_TABLET | Freq: Four times a day (QID) | ORAL | 0 refills | Status: DC

## 2019-07-15 NOTE — Progress Notes (Signed)
Discharge order received from doctor. Reviewed discharge instructions and prescriptions with patient and answered all questions. Follow up appointment instructions given. Patient verbalized understanding. ID bands checked. Patient discharged home with infant via wheelchair by nursing/auxillary.    Yasmeen Manka Garner, RN  

## 2019-07-15 NOTE — Progress Notes (Signed)
CSW consulted as MOB scored 11 on Edinburgh. CSW went to speak with MOB at bedside, however MOB not in room at this time. CSW will check back later to speak with MOB.       Olivya Sobol S. Kacin Dancy, MSW, LCSW [facility] [phone]   

## 2019-07-15 NOTE — Anesthesia Postprocedure Evaluation (Signed)
Anesthesia Post Note  Patient: Molly Bray  Procedure(s) Performed: AN AD [place]  Patient location during evaluation: Mother Baby Anesthesia Type: Epidural Level of consciousness: awake, awake and alert and oriented Pain management: pain level not controlled Vital Signs Assessment: post-procedure vital signs reviewed and stable Respiratory status: spontaneous breathing, respiratory function stable and nonlabored ventilation Cardiovascular status: blood pressure returned to baseline and stable Postop Assessment: no headache, patient able to bend at knees and no apparent nausea or vomiting Anesthetic complications: yes Anesthetic complication details: anesthesia complicationsComments: Pt c/o  Severe back pain at injection sight. States they took a long time placing it. Has taken tylenol, motrin and percocet without relief. States have to walk hunched over and can only lie on back. Injection site  Examined no redness or swelling. Denies pain numbness and tingling. MDA made aware     Last Vitals:  Vitals:   07/14/19 2015 07/14/19 2339  BP: 120/61 98/63  Pulse: 76 88  Resp: 18 20  Temp: 37.2 C 36.9 C  SpO2: 98% 99%    Last Pain:  Vitals:   07/15/19 0438  TempSrc:   PainSc: 5                  Proofreader

## 2019-07-15 NOTE — Progress Notes (Signed)
CSW received consult due to score 11 on [place] Depression Screen.    CSW spoke with MOB at bedside to address further needs. CSW introduced role and advised MOB of the reason CSW had come to speak with her. MOB reported that she has a history of depression and anxiety. CSW inquired from [facility] on when she was diagnosed with both and MOB reported that "it was awhile ago, in like childhood". CSW asked MOB if she has previously or recently taken any medications for her mental health and MOB reported that she hasn't. MOB did express to CSW that she is working to get into [facility] in [place] so that she can start seeing a therapist as "ive had a lot happen in my life". CSW advised MOB of [facility]'s services and ways that they may be able to assist MOB. MOB reported that her main issue with getting into therapy and staying is she doesn't have anyone to watch her children. MOB reports that she has her partner and that is it. MOB expressed that he works and then she usually has the children. CSW inquired from [facility] on if she has any family or friends that may be willing to watch her children while she goes to appointments and MOB reported that she has her mom but "shes very sick and has her own stuff". CSW understanding and proceeded with giving MOB information on PPD and SIDS.   CSW provided education regarding Baby Blues vs PMADs and provided MOB with resources for mental health follow up.  CSW encouraged MOB to evaluate her mental health throughout the postpartum period with the use of the New Mom Checklist developed by [facility] as well as the [place] Postnatal Depression Scale and notify a medical professional if symptoms arise.      Molly Bray, MSW, LCSW [facility] [place] [phone]

## 2019-07-15 NOTE — Anesthesia Postprocedure Evaluation (Deleted)
Anesthesia Post Note  Patient: Molly Bray  Procedure(s) Performed: AN AD HOC LABOR EPIDURAL  Patient location during evaluation: Mother Baby Anesthesia Type: Epidural Level of consciousness: awake and alert Pain management: pain level controlled Vital Signs Assessment: post-procedure vital signs reviewed and stable Respiratory status: spontaneous breathing, nonlabored ventilation and respiratory function stable Cardiovascular status: stable Postop Assessment: no headache, no backache and epidural receding Anesthetic complications: no     Last Vitals:  Vitals:   07/14/19 2015 07/14/19 2339  BP: 120/61 98/63  Pulse: 76 88  Resp: 18 20  Temp: 37.2 C 36.9 C  SpO2: 98% 99%    Last Pain:  Vitals:   07/15/19 0438  TempSrc:   PainSc: 5                  Eudell Mcphee Lorenza Chick

## 2019-07-15 NOTE — Addendum Note (Signed)
Addendum  created 07/15/19 0716 by Hedda Slade, CRNA   Delete clinical note

## 2019-07-15 NOTE — Addendum Note (Signed)
Addendum  created 07/15/19 0724 by Johnna Acosta, CRNA   Clinical Note Signed

## 2019-07-15 NOTE — Discharge Instructions (Signed)

## 2019-07-27 ENCOUNTER — Other Ambulatory Visit: Payer: Self-pay | Admitting: Obstetrics & Gynecology

## 2019-07-27 DIAGNOSIS — K429 Umbilical hernia without obstruction or gangrene: Secondary | ICD-10-CM

## 2019-07-28 ENCOUNTER — Ambulatory Visit (INDEPENDENT_AMBULATORY_CARE_PROVIDER_SITE_OTHER): Payer: Medicaid Other | Admitting: General Surgery

## 2019-07-28 ENCOUNTER — Encounter: Payer: Self-pay | Admitting: General Surgery

## 2019-07-28 ENCOUNTER — Encounter: Payer: Self-pay | Admitting: *Deleted

## 2019-07-28 ENCOUNTER — Other Ambulatory Visit: Payer: Self-pay

## 2019-07-28 VITALS — BP 103/73 | HR 107 | Temp 97.5°F | Ht 61.0 in | Wt 183.4 lb

## 2019-07-28 DIAGNOSIS — K429 Umbilical hernia without obstruction or gangrene: Secondary | ICD-10-CM | POA: Insufficient documentation

## 2019-07-28 DIAGNOSIS — F17200 Nicotine dependence, unspecified, uncomplicated: Secondary | ICD-10-CM | POA: Diagnosis not present

## 2019-07-28 DIAGNOSIS — K439 Ventral hernia without obstruction or gangrene: Secondary | ICD-10-CM | POA: Diagnosis not present

## 2019-07-28 NOTE — Progress Notes (Signed)
Patient ID: Molly Bray, female   DOB: October 10, 1995, 24 y.o.   MRN: [id number]  Chief Complaint  Patient presents with  . Follow-up     ref Dr.Robert Harris umbilical hernia- per Dr.Harris wants pts sx before shutdown    HPI Molly Bray is a 24 y.o. female.   I first saw her in August 2020.  My initial HPI is copied here:  "She was referred by her OB/GYN, Dr. Teresa Coombs, for evaluation of a possible umbilical hernia.  Ms. Shockley gives a history of being told recently that she had an umbilical hernia as a child, but that it was small and was not repaired.  She states that her navel has always been exquisitely sensitive to contact or touch.  She is currently [redacted] weeks pregnant and was seen last week by Dr. Kenton Kingfisher.  She was complaining of pain in the area of her umbilicus.  She was referred to general surgery to evaluate for a hernia.  She has had nausea and vomiting associated with her pregnancy, but nothing worse recently.  No obstipation or other symptoms of bowel obstruction secondary to incarceration.  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  She was recently induced for delivery at the end of December.  The patient states that this was done because she was in so much pain from the hernia.  She reports that she is planning to have a tubal ligation performed in the near future.  She would like to have her hernia repaired at the same time.  She is aware that due to COVID-19, elective procedures at [facility] will be significantly curtailed as  of January 25; she was hoping to have the surgery before that time.  She continues to deny any nausea or vomiting associated with the hernia.  She says that it is more prominent when she has been up and active and then becomes less so after she is able to lie down at night.  She describes a burning sensation that radiates laterally to the right of the hernia.  No symptoms of obstruction.  She is hoping that her hernia repair can be done at the same time as her tubal ligation.   Past Medical History:  Diagnosis Date  . Anxiety   . Asthma   . Chronic kidney disease    KIDNEY REFLUX  . Depression   . GERD (gastroesophageal reflux disease)    WITH PREGNANCY ONLY  . Headache    MIGRAINES  . History of methicillin resistant staphylococcus aureus (MRSA) 2008  . IBS (irritable bowel syndrome)    constipation  . Pinched nerve   . Pyelonephritis   . Recurrent UTI     Past Surgical History:  Procedure Laterality Date  . CHOLECYSTECTOMY N/A 03/07/2018   Procedure: LAPAROSCOPIC CHOLECYSTECTOMY;  Surgeon: Benjamine Sprague, DO;  Location: [facility] ORS;  Service: General;  Laterality: N/A;  . KIDNEY SURGERY      Family History  Problem Relation Age of Onset  . COPD Mother   . Diabetes Maternal Aunt   .  COPD Father   . Hypertension Neg Hx   . Ovarian cancer Neg Hx   . Breast cancer Neg Hx   . Heart disease Neg Hx     Social History Social History   Tobacco Use  . Smoking status: Current Every Day Smoker    Packs/day: 0.50    Years: 8.00    Pack years: 4.00    Types: Cigarettes  . Smokeless tobacco: Never Used  Substance Use Topics  . Alcohol use: No  . Drug use: No  Patient states that she is smoking "a lot more" lately due to anxiety.  She declined to quantify the amount.  Allergies  Allergen Reactions  . Amoxicillin Anaphylaxis  . Vancomycin Itching    Patient with obvious "redmans" syndrome  . Penicillins Swelling    Has patient had a PCN reaction causing immediate rash,  facial/tongue/throat swelling, SOB or lightheadedness with hypotension: Yes Has patient had a PCN reaction causing severe rash involving mucus membranes or skin necrosis: No Has patient had a PCN reaction that required hospitalization No Has patient had a PCN reaction occurring within the last 10 years: not sure  If all of the above answers are "NO", then may proceed with Cephalosporin use.  Lyman Bishop Sulfadiazine Other (See Comments)    Pain/burning sensation    Current Outpatient Medications  Medication Sig Dispense Refill  . Prenatal Vit-Fe Fumarate-FA (PRENATAL VITAMIN PO) Take by mouth.    . B-D 3CC LUER-LOK SYR 18GX1-1/2 18G X 1-1/2" 3 ML MISC See admin instructions.    . BD DISP NEEDLES 21G X 1-1/2" MISC See admin instructions.    . cyclobenzaprine (FLEXERIL) 10 MG tablet Take 1 tablet (10 mg total) by mouth every 8 (eight) hours as needed for muscle spasms. (Patient not taking: Reported on 07/28/2019) 30 tablet 1  . ibuprofen (ADVIL) 600 MG tablet Take 1 tablet (600 mg total) by mouth every 6 (six) hours. (Patient not taking: Reported on 07/28/2019) 30 tablet 0  . oxyCODONE-acetaminophen (PERCOCET/ROXICET) 5-325 MG tablet Take 1 tablet by mouth every 4 (four) hours as needed (breakthrough pain). (Patient not taking: Reported on 07/28/2019) 15 tablet 0   No current facility-administered medications for this visit.    Review of Systems Review of Systems  Gastrointestinal: Positive for abdominal pain.  Psychiatric/Behavioral: The patient is nervous/anxious.   All other systems reviewed and are negative.   Blood pressure 103/73, pulse (!) 107, temperature (!) 97.5 F (36.4 C), temperature source Temporal, height 5\' 1"  (1.549 m), weight 183 lb 6.4 oz (83.2 kg), SpO2 96 %, unknown if currently breastfeeding.  Physical Exam Physical Exam Constitutional:      General: She is not in acute distress.    Appearance: She is obese.     Comments: Intermittently tearful.  HENT:     Head:  Normocephalic and atraumatic.     Nose:     Comments: Covered with a mask secondary to COVID-19 precautions    Mouth/Throat:     Comments: Covered with a mask secondary to COVID-19 precautions Eyes:     General: No scleral icterus.       Right eye: No discharge.        Left eye: No discharge.  Cardiovascular:     Rate and Rhythm: Regular rhythm. Tachycardia present.     Pulses: Normal pulses.  Pulmonary:     Effort: Pulmonary effort is normal. No respiratory distress.  Abdominal:     Palpations: Abdomen is soft.  Hernia: A hernia is present.     Comments: She is exquisitely sensitive and will not allow me to thoroughly examine her abdomen.  There is a bulge present just above the umbilicus while the patient is standing.  In the supine position, the bulge reduces.  With a Valsalva maneuver, there appears to be both a supraumbilical/epigastric bulge as well as one at the umbilicus itself.  Genitourinary:    Comments: Deferred Musculoskeletal:        General: No deformity.  Lymphadenopathy:     Cervical: No cervical adenopathy.  Skin:    General: Skin is warm and dry.  Neurological:     General: No focal deficit present.     Mental Status: She is alert.  Psychiatric:        Behavior: Behavior normal.     Data Reviewed No new data available for review.  Assessment This is a 24 year old woman who I first saw in August 2020.  At that time, she had a small periumbilical hernia.  She was also pregnant at the time.  Her hernia was not repaired due to the certainty that the repair would be disrupted by the progression of her pregnancy.  She is now a couple of weeks postpartum and would like to have her hernia repaired at the same time as her tubal ligation.  The defect has now enlarged to the point that I think a mesh repair would be necessary.  She had some questions and concerns regarding the use of mesh and complications related thereto.  She was reassured that none of the recalled  meshes for which she has seen many class action lawsuit advertisements on television are in use.  Unfortunately, she continues to smoke and admits to having increased the amount of tobacco consumption since delivery of her child.  Continued nicotine use is a contraindication to hernia repair as it greatly increases the risk of failure as well as surgical site infection.  Plan A minimum of 4 to 8 weeks smoking cessation has been recommended prior to proceeding with a elective hernia repair.  I discussed this with Ms. Langford.  She became quite tearful during this portion of the discussion, stating that smoking is the only way she has to manage her anxiety.  She states that she is not able to see any kind of provider to obtain counseling and/or medical help for this.  She says that her primary care provider mostly focuses on her kidney problems and has not been helpful in regards to her anxiety.  I encouraged her to at least broach the topic with her regular doctor.  She may elect to seek a different provider, if she is unable to obtain assistance.  We placed a referral to our smoking cessation clinic.  She will need to be nicotine free for a minimum of 2 weeks and preferably the 4-8 weeks recommended by the medical literature.  I understand that she is experiencing pain from her hernia, but it seems futile to proceed with surgical intervention that is at a higher risk of failure, rather than optimize her chances for success.  She is aware that she should contact us once she has been nicotine free.  We will determine at that time, what the current Covid-related restrictions on elective surgery will permit [place] to do.  She is also aware that she will have a nicotine test prior to surgery and that if it is positive, her operation would be canceled.  We have placed her in  our queue elective cases to be scheduled when more normal conditions prevail.    Duanne Guess 07/28/2019, 1:15 PM

## 2019-07-28 NOTE — Patient Instructions (Addendum)
Dr.Cannon recommends patient to stop smoke for two weeks and nicotine-free before proceeding with hernia repair surgery. Patient was also advised that she would not be able to lift any thing heavier than 10 lbs or more for six weeks. Patient surgical paperwork was completed at today's visit. Message was sent to Glenna Fellows to help with patient with the Tobacco Cessation Program.  Umbilical Hernia, Adult  A hernia is a bulge of tissue that pushes through an opening between muscles. An umbilical hernia happens in the abdomen, near the belly button (umbilicus). The hernia may contain tissues from the small intestine, large intestine, or fatty tissue covering the intestines (omentum). Umbilical hernias in adults tend to get worse over time, and they require surgical treatment. There are several types of umbilical hernias. You may have:  A hernia located just above or below the umbilicus (indirect hernia). This is the most common type of umbilical hernia in adults.  A hernia that forms through an opening formed by the umbilicus (direct hernia).  A hernia that comes and goes (reducible hernia). A reducible hernia may be visible only when you strain, lift something heavy, or cough. This type of hernia can be pushed back into the abdomen (reduced).  A hernia that traps abdominal tissue inside the hernia (incarcerated hernia). This type of hernia cannot be reduced.  A hernia that cuts off blood flow to the tissues inside the hernia (strangulated hernia). The tissues can start to die if this happens. This type of hernia requires emergency treatment. What are the causes? An umbilical hernia happens when tissue inside the abdomen presses on a weak area of the abdominal muscles. What increases the risk? You may have a greater risk of this condition if you:  Are obese.  Have had several pregnancies.  Have a buildup of fluid inside your abdomen (ascites).  Have had surgery that weakens the abdominal  muscles. What are the signs or symptoms? The main symptom of this condition is a painless bulge at or near the belly button. A reducible hernia may be visible only when you strain, lift something heavy, or cough. Other symptoms may include:  Dull pain.  A feeling of pressure. Symptoms of a strangulated hernia may include:  Pain that gets increasingly worse.  Nausea and vomiting.  Pain when pressing on the hernia.  Skin over the hernia becoming red or purple.  Constipation.  Blood in the stool. How is this diagnosed? This condition may be diagnosed based on:  A physical exam. You may be asked to cough or strain while standing. These actions increase the pressure inside your abdomen and force the hernia through the opening in your muscles. Your health care provider may try to reduce the hernia by pressing on it.  Your symptoms and medical history. How is this treated? Surgery is the only treatment for an umbilical hernia. Surgery for a strangulated hernia is done as soon as possible. If you have a small hernia that is not incarcerated, you may need to lose weight before having surgery. Follow these instructions at home:  Lose weight, if told by your health care provider.  Do not try to push the hernia back in.  Watch your hernia for any changes in color or size. Tell your health care provider if any changes occur.  You may need to avoid activities that increase pressure on your hernia.  Do not lift anything that is heavier than 10 lb (4.5 kg) until your health care provider says that this  is safe.  Take over-the-counter and prescription medicines only as told by your health care provider.  Keep all follow-up visits as told by your health care provider. This is important. Contact a health care provider if:  Your hernia gets larger.  Your hernia becomes painful. Get help right away if:  You develop sudden, severe pain near the area of your hernia.  You have pain as  well as nausea or vomiting.  You have pain and the skin over your hernia changes color.  You develop a fever. This information is not intended to replace advice given to you by your health care provider. Make sure you discuss any questions you have with your health care provider. Document Revised: 08/14/2017 Document Reviewed: 12/31/2016 Elsevier Patient Education  [place].

## 2019-07-29 ENCOUNTER — Telehealth: Payer: Self-pay | Admitting: Obstetrics & Gynecology

## 2019-07-29 NOTE — Telephone Encounter (Signed)
Patient is aware Dr. Tiburcio Pea will wait on the surgery until general surgery is ready. Patient said Dr. Lady Gary was rude and she wants a referral to another general surgeon. Patient is aware she needs a sterilization consent 30 days prior to surgery for Medicaid. Patient was already scheduled w/ Dr. Tiburcio Pea on 1/18 @ 1:30pm, and will keep that appointment for tubal consult and consent, and new general surgeon request.

## 2019-07-29 NOTE — Telephone Encounter (Signed)
-----   Message from Nadara Mustard, MD sent at 07/27/2019  9:54 AM EST ----- Regarding: surg ALSO NEEDS CONSULT w GEN SURGERY STAT   Has seen Dr Lady Gary in past for hernia    Need combined surgery  Surgery Booking Request Patient Full Name:  Molly Bray  MRN: [id number]  DOB: 02/13/1996  Surgeon: Letitia Libra, MD  Requested Surgery Date and Time: Soon, combine w gen surgery Dr Lady Gary Primary Diagnosis AND Code: Sterilization Secondary Diagnosis and Code: Umbilical Hernia (per gen surgery) Surgical Procedure: Laparoscopy with Tubal Partial Salingectomy L&D Notification: No Admission Status: same day surgery Length of Surgery: 30 min Special Case Needs: Yes - gen surgery alsp H&P: Yes - 1/18 already scheduled Phone Interview???:  Yes Interpreter: No Language:  Medical Clearance:  No Special Scheduling Instructions: no Any known health/anesthesia issues, diabetes, sleep apnea, latex allergy, defibrillator/pacemaker?: No Acuity: P2   (P1 highest, P2 delay may cause harm, P3 low, elective gyn, P4 lowest)  Priority 2

## 2019-08-03 ENCOUNTER — Ambulatory Visit (INDEPENDENT_AMBULATORY_CARE_PROVIDER_SITE_OTHER): Payer: Medicaid Other | Admitting: Obstetrics & Gynecology

## 2019-08-03 ENCOUNTER — Other Ambulatory Visit: Payer: Self-pay

## 2019-08-03 ENCOUNTER — Encounter: Payer: Self-pay | Admitting: Obstetrics & Gynecology

## 2019-08-03 VITALS — BP 120/70 | Ht 62.0 in | Wt 187.0 lb

## 2019-08-03 DIAGNOSIS — Z3009 Encounter for other general counseling and advice on contraception: Secondary | ICD-10-CM

## 2019-08-03 DIAGNOSIS — K429 Umbilical hernia without obstruction or gangrene: Secondary | ICD-10-CM | POA: Diagnosis not present

## 2019-08-03 MED ORDER — NORETHIN ACE-ETH ESTRAD-FE 1-20 MG-MCG PO TABS: 1.0000 | ORAL_TABLET | Freq: Every day | ORAL | 11 refills | Status: DC

## 2019-08-03 MED ORDER — IBUPROFEN 600 MG PO TABS: 600.0000 mg | ORAL_TABLET | Freq: Four times a day (QID) | ORAL | 0 refills | Status: DC

## 2019-08-03 MED ORDER — OXYCODONE-ACETAMINOPHEN 5-325 MG PO TABS: 1.0000 | ORAL_TABLET | ORAL | 0 refills | Status: DC | PRN

## 2019-08-03 NOTE — Progress Notes (Signed)
Gynecology Pelvic Pain Evaluation   Chief Complaint:  Chief Complaint  Patient presents with  . Consult    History of Present Illness:   Patient is a 24 y.o. [id number] who LMP was No LMP recorded., presents today for a problem visit.  She complains of pain and also a desire for permanent sterility.   Her pain is localized to the periumbilical area, described as intermittent, sharp and stabbing, began several months ago and its severity is described as severe. The pain radiates to the  Non-radiating. She has these associated symptoms which include burning sensation, mild nausea. Patient has these modifiers which include relaxation that make it better and activity and especially work that make it worse.  Context includes: a known umb hernia for years but only during pregnancy and continuing now postpartum has it become so painful.  She takes narcotics when she can to help w pain.  PMHx: She  has a past medical history of Anxiety, Asthma, Chronic kidney disease, Depression, GERD (gastroesophageal reflux disease), Headache, History of methicillin resistant staphylococcus aureus (MRSA) (2008), IBS (irritable bowel syndrome), Pinched nerve, Pyelonephritis, and Recurrent UTI. Also,  has a past surgical history that includes Kidney surgery and Cholecystectomy (N/A, 03/07/2018)., family history includes COPD in her father and mother; Diabetes in her maternal aunt.,  reports that she has been smoking cigarettes. She has a 4.00 pack-year smoking history. She has never used smokeless tobacco. She reports that she does not drink alcohol or use drugs.  She has a current medication list which includes the following prescription(s): b-d 3cc luer-lok syr 18gx1-1/2, bd disp needles, cyclobenzaprine, ibuprofen, prenatal vit-fe fumarate-fa, norethindrone-ethinyl estradiol, and oxycodone-acetaminophen. Also, is allergic to amoxicillin; vancomycin; penicillins; and silver sulfadiazine.  Review of Systems   Constitutional: Negative for chills, fever and malaise/fatigue.  HENT: Negative for congestion, sinus pain and sore throat.   Eyes: Negative for blurred vision and pain.  Respiratory: Negative for cough and wheezing.   Cardiovascular: Negative for chest pain and leg swelling.  Gastrointestinal: Negative for abdominal pain, constipation, diarrhea, heartburn, nausea and vomiting.  Genitourinary: Negative for dysuria, frequency, hematuria and urgency.  Musculoskeletal: Negative for back pain, joint pain, myalgias and neck pain.  Skin: Negative for itching and rash.  Neurological: Negative for dizziness, tremors and weakness.  Endo/Heme/Allergies: Does not bruise/bleed easily.  Psychiatric/Behavioral: Negative for depression. The patient is not nervous/anxious and does not have insomnia.     Objective: BP 120/70   Ht 5\' 2"  (1.575 m)   Wt 187 lb (84.8 kg)   BMI 34.20 kg/m  Physical Exam Constitutional:      General: She is not in acute distress.    Appearance: She is well-developed.  Abdominal:     Comments: Soft ND mod T to touch at umb and right side  Musculoskeletal:        General: Normal range of motion.  Neurological:     Mental Status: She is alert and oriented to person, place, and time.  Skin:    General: Skin is warm and dry.  Vitals reviewed.    Assessment: 24 y.o. 24 with Umbilical and abdominal pain related to hernia..  1. Umbilical hernia without obstruction and without gangrene - Ambulatory referral to General Surgery - Dr [id number] - Pt had consult w Dr Lemar Livings, desires second opinion - IBF Rx and Percocet for limited use as needed eRx  2. Sterilization consult - Tubal consent has been done - Plan in conjunction with general surgeon - Pros and  cons of surgery, tubal, regret, and failure rates discussed - OCP until surgery, as wants to ensure no pregnancy (Bottle feeding only)  Barnett Applebaum, MD, [place], [facility] 08/03/2019   1:57 PM

## 2019-08-03 NOTE — Progress Notes (Signed)
PRE-OPERATIVE HISTORY AND PHYSICAL EXAM  HPI:  Molly Bray is a 24 y.o. [phone] No LMP recorded.; she is being admitted for surgery related to requested sterilization.  PMHx: Past Medical History:  Diagnosis Date  . Anxiety   . Asthma   . Chronic kidney disease    KIDNEY REFLUX  . Depression   . GERD (gastroesophageal reflux disease)    WITH PREGNANCY ONLY  . Headache    MIGRAINES  . History of methicillin resistant staphylococcus aureus (MRSA) 2008  . IBS (irritable bowel syndrome)    constipation  . Pinched nerve   . Pyelonephritis   . Recurrent UTI    Past Surgical History:  Procedure Laterality Date  . CHOLECYSTECTOMY N/A 03/07/2018   Procedure: LAPAROSCOPIC CHOLECYSTECTOMY;  Surgeon: Benjamine Sprague, DO;  Location: [facility] ORS;  Service: General;  Laterality: N/A;  . KIDNEY SURGERY     Family History  Problem Relation Age of Onset  . COPD Mother   . Diabetes Maternal Aunt   . COPD Father   . Hypertension Neg Hx   . Ovarian cancer Neg Hx   . Breast cancer Neg Hx   . Heart disease Neg Hx    Social History   Tobacco Use  . Smoking status: Current Every Day Smoker    Packs/day: 0.50    Years: 8.00    Pack years: 4.00    Types: Cigarettes  . Smokeless tobacco: Never Used  Substance Use Topics  . Alcohol use: No  . Drug use: No    Current Outpatient Medications:  .  B-D 3CC LUER-LOK SYR 18GX1-1/2 18G X 1-1/2" 3 ML MISC, See admin instructions., Disp: , Rfl:  .  BD DISP NEEDLES 21G X 1-1/2" MISC, See admin instructions., Disp: , Rfl:  .  cyclobenzaprine (FLEXERIL) 10 MG tablet, Take 1 tablet (10 mg total) by mouth every 8 (eight) hours as needed for muscle spasms., Disp: 30 tablet, Rfl: 1 .  ibuprofen (ADVIL) 600 MG tablet, Take 1 tablet (600 mg total) by mouth every 6 (six) hours., Disp: 30 tablet, Rfl: 0 .  Prenatal Vit-Fe Fumarate-FA (PRENATAL VITAMIN PO), Take by mouth., Disp: , Rfl:  .  norethindrone-ethinyl estradiol (JUNEL FE 1/20) 1-20 MG-MCG  tablet, Take 1 tablet by mouth daily., Disp: 1 Package, Rfl: 11 .  oxyCODONE-acetaminophen (PERCOCET/ROXICET) 5-325 MG tablet, Take 1 tablet by mouth every 4 (four) hours as needed (breakthrough pain)., Disp: 30 tablet, Rfl: 0 Allergies: Amoxicillin, Vancomycin, Penicillins, and Silver sulfadiazine  Review of Systems  Constitutional: Negative for chills, fever and malaise/fatigue.  HENT: Negative for congestion, sinus pain and sore throat.   Eyes: Negative for blurred vision and pain.  Respiratory: Negative for cough and wheezing.   Cardiovascular: Negative for chest pain and leg swelling.  Gastrointestinal: Positive for abdominal pain and nausea. Negative for constipation, diarrhea, heartburn and vomiting.  Genitourinary: Negative for dysuria, frequency, hematuria and urgency.  Musculoskeletal: Negative for back pain, joint pain, myalgias and neck pain.  Skin: Negative for itching and rash.  Neurological: Negative for dizziness, tremors and weakness.  Endo/Heme/Allergies: Does not bruise/bleed easily.  Psychiatric/Behavioral: Negative for depression. The patient is not nervous/anxious and does not have insomnia.     Objective: BP 120/70   Ht 5\' 2"  (1.575 m)   Wt 187 lb (84.8 kg)   BMI 34.20 kg/m   Filed Weights   08/03/19 1333  Weight: 187 lb (84.8 kg)   Physical Exam Constitutional:  General: She is not in acute distress.    Appearance: She is well-developed.  HENT:     Head: Normocephalic and atraumatic. No laceration.     Right Ear: Hearing normal.     Left Ear: Hearing normal.     Mouth/Throat:     Pharynx: Uvula midline.  Eyes:     Pupils: Pupils are equal, round, and reactive to light.  Neck:     Thyroid: No thyromegaly.  Cardiovascular:     Rate and Rhythm: Normal rate and regular rhythm.     Heart sounds: No murmur. No friction rub. No gallop.   Pulmonary:     Effort: Pulmonary effort is normal. No respiratory distress.     Breath sounds: Normal breath  sounds. No wheezing.  Chest:     Breasts:        Right: No mass, skin change or tenderness.        Left: No mass, skin change or tenderness.  Abdominal:     General: Bowel sounds are normal. There is no distension.     Palpations: Abdomen is soft.     Tenderness: There is no abdominal tenderness. There is no rebound.     Comments: Umb hernia palpable and tender  Musculoskeletal:        General: Normal range of motion.     Cervical back: Normal range of motion and neck supple.  Neurological:     Mental Status: She is alert and oriented to person, place, and time.     Cranial Nerves: No cranial nerve deficit.  Skin:    General: Skin is warm and dry.  Psychiatric:        Judgment: Judgment normal.  Vitals reviewed.     Assessment: 1. Umbilical hernia without obstruction and without gangrene   2. Sterilization consult   Plan tubal - partial salpingectomy  The patient has been fully informed about all methods of contraception, both temporary and permanent. She understands that tubal ligation is meant to be permanent, absolute and irreversible. She was told that there is an approximately 1 in 400 chance of a pregnancy in the future after tubal ligation. She was told the short and long term complications of tubal ligation. She understands the risks from this surgery include, but are not limited to, the risks of anesthesia, hemorrhage, infection, perforation, and injury to adjacent structures, bowel, bladder and blood vessels.   Annamarie Major, MD, [facility], [facility] 08/03/2019  2:03 PM

## 2019-08-07 ENCOUNTER — Telehealth: Payer: Self-pay

## 2019-08-07 NOTE — Telephone Encounter (Signed)
Pt called triage stating she had swelling in hands and now her face, she doesn't have any feeling in her hands and could not hold on to anything. She states that Dr Tiburcio Pea told her that if the symptoms lasted longer than 24 hours she should go to the ER, I advised her to go since she stated she's been like this since yesterday and the swelling is getting worse

## 2019-08-10 ENCOUNTER — Ambulatory Visit: Payer: Medicaid Other | Admitting: Obstetrics & Gynecology

## 2019-08-10 ENCOUNTER — Telehealth: Payer: Self-pay

## 2019-08-10 NOTE — Telephone Encounter (Signed)
Pt states her face is not swollen today just her hands and her apt with Dr Lemar Livings is on 08/24/2019, she didn't make it today because she didn't have a babysitter,

## 2019-08-10 NOTE — Telephone Encounter (Signed)
-----   Message from Nadara Mustard, MD sent at 08/10/2019  7:20 AM EST ----- Regarding: check in on Plz check in on and see how she is compared to Friday. Also, plz cancel her appt today or change to tele appt only See when her Dr Lemar Livings appt is to be as well Thx

## 2019-08-24 ENCOUNTER — Ambulatory Visit: Payer: Medicaid Other | Admitting: Obstetrics & Gynecology

## 2019-08-28 ENCOUNTER — Ambulatory Visit: Payer: Medicaid Other | Admitting: Obstetrics & Gynecology

## 2019-09-11 ENCOUNTER — Other Ambulatory Visit: Payer: Self-pay

## 2019-09-11 ENCOUNTER — Encounter: Payer: Self-pay | Admitting: Obstetrics & Gynecology

## 2019-09-11 ENCOUNTER — Other Ambulatory Visit (HOSPITAL_COMMUNITY)
Admission: RE | Admit: 2019-09-11 | Discharge: 2019-09-11 | Disposition: A | Discharge status: Home / Self Care | Payer: Medicaid Other | Source: Ambulatory Visit | Attending: Obstetrics & Gynecology | Admitting: Obstetrics & Gynecology

## 2019-09-11 ENCOUNTER — Ambulatory Visit (INDEPENDENT_AMBULATORY_CARE_PROVIDER_SITE_OTHER): Payer: Medicaid Other | Admitting: Obstetrics & Gynecology

## 2019-09-11 VITALS — BP 120/80 | Ht 63.0 in | Wt 166.0 lb

## 2019-09-11 DIAGNOSIS — Z124 Encounter for screening for malignant neoplasm of cervix: Secondary | ICD-10-CM | POA: Diagnosis present

## 2019-09-11 DIAGNOSIS — Z8279 Family history of other congenital malformations, deformations and chromosomal abnormalities: Secondary | ICD-10-CM

## 2019-09-11 MED ORDER — OXYCODONE-ACETAMINOPHEN 5-325 MG PO TABS: 1.0000 | ORAL_TABLET | ORAL | 0 refills | Status: DC | PRN

## 2019-09-11 NOTE — Progress Notes (Signed)
  OBSTETRICS POSTPARTUM CLINIC PROGRESS NOTE  Subjective:     Molly Bray is a 24 y.o. [phone] female who presents for a postpartum visit. She is 8 weeks postpartum following a Term pregnancy and delivery by Vaginal, no problems at delivery.  I have fully reviewed the prenatal and intrapartum course. Anesthesia: epidural.  Postpartum course has been complicated by uncomplicated.  Baby is feeding by Bottle.  Bleeding: patient has not  resumed menses.  Bowel function is normal. Bladder function is normal.  Patient is not sexually active. Contraception method desired is OCP (estrogen/progesterone).  Pt desires BTL, and will have this combined w hernia repair once able to have this done.  Postpartum depression screening: positive. Edinburgh 10.  Stress w baby and living conditions.  Baby has CAH, and is having testing and treatments.  The following portions of the patient's history were reviewed and updated as appropriate: allergies, current medications, past family history, past medical history, past social history, past surgical history and problem list.  Review of Systems Pertinent items are noted in HPI.  Objective:    BP 120/80   Ht 5\' 3"  (1.6 m)   Wt 166 lb (75.3 kg)   BMI 29.41 kg/m   General:  alert and no distress   Breasts:  inspection negative, no nipple discharge or bleeding, no masses or nodularity palpable  Lungs: clear to auscultation bilaterally  Heart:  regular rate and rhythm, S1, S2 normal, no murmur, click, rub or gallop  Abdomen: soft, non-tender; bowel sounds normal; no masses,  no organomegaly.     Vulva:  normal  Vagina: normal vagina, no discharge, exudate, lesion, or erythema  Cervix:  no cervical motion tenderness and no lesions  Corpus: normal size, contour, position, consistency, mobility, non-tender  Adnexa:  normal adnexa and no mass, fullness, tenderness  Rectal Exam: Not performed.          Assessment:  Post Partum Care visit 1.  Screening for cervical cancer - Cytology - PAP  2. Postpartum exam  3. Family history of congenital anomaly - 17-Hydroxyprogesterone   Plan:  See orders and Patient Instructions Advised to stop smoking Contraceptive counseling for OCPs Follow up for Annual   or as needed.   , MD, Annamarie Major Ob/Gyn, [facility] 09/11/2019  4:21 PM

## 2019-09-16 LAB — CYTOLOGY - PAP
Chlamydia: NEGATIVE
Comment: NEGATIVE
Comment: NORMAL
Diagnosis: NEGATIVE
Neisseria Gonorrhea: NEGATIVE

## 2019-09-26 LAB — 17-HYDROXYPROGESTERONE: 17-Hydroxyprogesterone: <10 ng/dL

## 2019-10-08 ENCOUNTER — Emergency Department
Admission: EM | Admit: 2019-10-08 | Discharge: 2019-10-08 | Discharge status: Against Medical Advice | Payer: Medicaid Other

## 2019-12-17 ENCOUNTER — Other Ambulatory Visit: Payer: Self-pay

## 2019-12-17 ENCOUNTER — Encounter: Payer: Self-pay | Admitting: Obstetrics & Gynecology

## 2019-12-17 ENCOUNTER — Ambulatory Visit (INDEPENDENT_AMBULATORY_CARE_PROVIDER_SITE_OTHER): Payer: Medicaid Other | Admitting: Obstetrics & Gynecology

## 2019-12-17 VITALS — BP 120/70 | Ht 62.0 in | Wt 168.0 lb

## 2019-12-17 DIAGNOSIS — N921 Excessive and frequent menstruation with irregular cycle: Secondary | ICD-10-CM | POA: Diagnosis not present

## 2019-12-17 NOTE — Progress Notes (Signed)
  HPI:      [name] is a 24 y.o. [phone] who LMP was Patient's last menstrual period was 12/03/2019., presents today for a problem visit.  She complains of menometrorrhagia that  began about a month ago and its severity is described as moderate.  OCPs 4 mos since delivery, usually reg until this past month where period was delayed, then occurred midcycle of next pill pack.  uCG neg.  Not currently sex active.  Fatigue.  Did take ABX fro UTI during that time period.  No associated other sx's. Plans BTL soon.  PMHx: She  has a past medical history of Anxiety, Asthma, Chronic kidney disease, Depression, GERD (gastroesophageal reflux disease), Headache, History of methicillin resistant staphylococcus aureus (MRSA) (2008), IBS (irritable bowel syndrome), Pinched nerve, Pyelonephritis, and Recurrent UTI. Also,  has a past surgical history that includes Kidney surgery and Cholecystectomy (N/A, 03/07/2018)., family history includes COPD in her father and mother; Diabetes in her maternal aunt.,  reports that she has been smoking cigarettes. She has a 4.00 pack-year smoking history. She has never used smokeless tobacco. She reports that she does not drink alcohol or use drugs.  She  Current Outpatient Medications:  .  atorvastatin (LIPITOR) 10 MG tablet, Take 10 mg by mouth daily., Disp: , Rfl:  .  norethindrone-ethinyl estradiol (JUNEL FE 1/20) 1-20 MG-MCG tablet, Take 1 tablet by mouth daily., Disp: 1 Package, Rfl: 11 .  Prenatal Vit-Fe Fumarate-FA (PRENATAL VITAMIN PO), Take by mouth., Disp: , Rfl:   Also, is allergic to amoxicillin; vancomycin; penicillins; and silver sulfadiazine.  Review of Systems  All other systems reviewed and are negative.   Objective: BP 120/70   Ht 5\' 2"  (1.575 m)   Wt 168 lb (76.2 kg)   LMP 12/03/2019   BMI 30.73 kg/m  Physical Exam Constitutional:      General: She is not in acute distress.    Appearance: She is well-developed.  Musculoskeletal:          General: Normal range of motion.  Neurological:     Mental Status: She is alert and oriented to person, place, and time.  Skin:    General: Skin is warm and dry.  Vitals reviewed.     ASSESSMENT/PLAN:   Problem List Items Addressed This Visit    Menometrorrhagia    -  Primary    Plan change in pill Also may be related to ABX use 2 mos ago. Monitor for next cycle. Plans BTL soon, when she can also have hernia surgery w gen surgery Tubal papers signed again today  12/05/2019, MD, Annamarie Major Ob/Gyn, [facility] 12/17/2019  11:42 AM

## 2020-01-04 NOTE — Progress Notes (Signed)
01/05/20 1:04 PM   Molly Bray 02/28/1996 [id number]  Referring provider: Grayland Jack, FNP [address] [place],  [place] [address] Chief Complaint  Patient presents with   Urinary Tract Infection    HPI: Molly Bray is a 24 y.o. F who presents today for the evaluation and management of rUTIs.   Her UA from 11/20/19 was grossly positive w/ growth of E. Coli as below. F/u CT A/P from 11/21/19 revealed bladder wall thickening. She revisited [facility] ED w/ subjective fever and urinary frequency. Her urine at the time had >30 WBC and large squamous epithelial cells. She ended up growing hemolytic strep as below.   She reports that she has been treated on multiple occasions by her primary care physician Dr. Gordy Councilman for recurrent infection.  We have no records from her primary care today.  Release was signed.  She reports of enuresis onset since she was 24 years old.   This has been happening on almost a nightly basis.  She was provided with pads which she lays down in her bed to keep her mattress dry but these are getting worn out.  She is never tried incontinence diapers at nighttime.  She is very embarrassed by this.  This was never addressed during her childhood.  She also reports of frequency, urge incontinence, and stress incontinence when she laughs, coughs and sneezes which worsened after having kids.  She also complains of daytime urgency frequency which is very bothersome to her.  She always knows where the bathroom is and has to run to get there on time.  This is also chronic issue.  She has not tried OAB medications.   She was seen by a pediatric urologist and had a surgery performed in [place] at a young age.  She will request for records from previous care.  She may have been on suppressive antibiotics.  She denies burning w/ urination today. She states of taking Azo for her rUTIs and has not tried probiotics. She has lost her job recently and has Medicaid.    She reports of bowel movement every other week and is not sure if this is normal or not.  Currently, she does not engage in a new sort of bowel regimen as she cannot afford products OTC.  PVR 9 mL.   She is a smoker.   +Urine cultures 12/10/18 indicative of Beta hemolytic Streptococcus, group B 11/20/19 indicative of E. Coli resistant to ampicillin, cipro, gentamicin and levofloxacin   PMH: Past Medical History:  Diagnosis Date   Anxiety    Asthma    Chronic kidney disease    KIDNEY REFLUX   Depression    GERD (gastroesophageal reflux disease)    WITH PREGNANCY ONLY   Headache    MIGRAINES   History of methicillin resistant staphylococcus aureus (MRSA) 2008   IBS (irritable bowel syndrome)    constipation   Pinched nerve    Pyelonephritis    Recurrent UTI     Surgical History: Past Surgical History:  Procedure Laterality Date   CHOLECYSTECTOMY N/A 03/07/2018   Procedure: LAPAROSCOPIC CHOLECYSTECTOMY;  Surgeon: Sung Amabile, DO;  Location: [facility] ORS;  Service: General;  Laterality: N/A;   KIDNEY SURGERY      Home Medications:  Allergies as of 01/05/2020      Reactions   Amoxicillin Anaphylaxis   Vancomycin Itching   Patient with obvious "redmans" syndrome   Penicillins Swelling   Has patient had a PCN reaction causing  immediate rash, facial/tongue/throat swelling, SOB or lightheadedness with hypotension: Yes Has patient had a PCN reaction causing severe rash involving mucus membranes or skin necrosis: No Has patient had a PCN reaction that required hospitalization No Has patient had a PCN reaction occurring within the last 10 years: not sure If all of the above answers are "NO", then may proceed with Cephalosporin use.   Silver Sulfadiazine Other (See Comments)   Pain/burning sensation      Medication List       Accurate as of January 05, 2020 11:59 PM. If you have any questions, ask your nurse or doctor.        ALPRAZolam 0.25 MG tablet Commonly  known as: XANAX Take 0.25 mg by mouth at bedtime as needed for anxiety.   atorvastatin 10 MG tablet Commonly known as: LIPITOR Take 10 mg by mouth daily.   docusate sodium 100 MG capsule Commonly known as: COLACE Take 1 capsule (100 mg total) by mouth 2 (two) times daily. Started by: Hollice Espy, MD   Incontinence Supplies Misc Depends diapers use as needed Started by: Hollice Espy, MD   Latuda 120 MG Tabs Generic drug: Lurasidone HCl Take by mouth.   norethindrone-ethinyl estradiol 1-20 MG-MCG tablet Commonly known as: Junel FE 1/20 Take 1 tablet by mouth daily.   oxybutynin 15 MG 24 hr tablet Commonly known as: DITROPAN XL Take 1 tablet (15 mg total) by mouth daily. Started by: Hollice Espy, MD   PRENATAL VITAMIN PO Take by mouth.       Allergies:  Allergies  Allergen Reactions   Amoxicillin Anaphylaxis   Vancomycin Itching    Patient with obvious "redmans" syndrome   Penicillins Swelling    Has patient had a PCN reaction causing immediate rash, facial/tongue/throat swelling, SOB or lightheadedness with hypotension: Yes Has patient had a PCN reaction causing severe rash involving mucus membranes or skin necrosis: No Has patient had a PCN reaction that required hospitalization No Has patient had a PCN reaction occurring within the last 10 years: not sure  If all of the above answers are "NO", then may proceed with Cephalosporin use.   Silver Sulfadiazine Other (See Comments)    Pain/burning sensation    Family History: Family History  Problem Relation Age of Onset   COPD Mother    Diabetes Maternal Aunt    COPD Father    Hypertension Neg Hx    Ovarian cancer Neg Hx    Breast cancer Neg Hx    Heart disease Neg Hx     Social History:  reports that she has been smoking cigarettes. She has a 4.00 pack-year smoking history. She has never used smokeless tobacco. She reports that she does not drink alcohol and does not use  drugs.   Physical Exam: BP 113/79    Pulse (!) 128    Ht 5\' 4"  (1.626 m)    BMI 28.84 kg/m   Constitutional:  Alert and oriented, No acute distress.  Accompanied by 46-year-old son today. HEENT: [place] AT, moist mucus membranes.  Trachea midline, no masses. Cardiovascular: No clubbing, cyanosis, or edema. Respiratory: Normal respiratory effort, no increased work of breathing. Skin: No rashes, bruises or suspicious lesions. Neurologic: Grossly intact, no focal deficits, moving all 4 extremities. Psychiatric: Normal mood and affect.  Laboratory Data:  Urinalysis 6-10 WBC and moderate bacteria   Pertinent Imaging: Results for orders placed or performed in visit on 01/05/20  Bladder Scan (Post Void Residual) in office  Result Value Ref  Range   Scan Result 9    Assessment & Plan:    1. rUTI  UA today revealed 6-10 WBC and moderate bacteria however asymptomatic, will not treat  1 true documented infection  Counseled pt on prevention techniques including probiotics and cranberry tablets Advised to return to our office specifically if she has any UTI type symptoms Records of previous urine cultures requested from PCP  2. Urgency/urgency incontinence  Advised to keep a voiding diary  Rx of oxybutynin sent to pharmacy. Discussed side effects of dry mouth, dry eyes, and constipation  Return in 1 month for symptom recheck   3. Enuresis Chronic life long issue  Pt was given Depends as well as a prescription for incontinence items Given that she has so many issues and this is a lifelong issue, will continue to address this but at a later date, may benefit from DDAVP down the road as well as some behavioral intervention  4. Chronic Constipation  Recommended stool softener x 2 per day to start her on a bowel regimen  Rx of colace sent to pharmacy   5. Stress incontinence  Will treat after above   Records requested from PCP, pediatric urologist.  Follow-up in 1 month with bladder  diary  [facility] [address], [address] [place], [place] [address] [phone]  I, Donne Hazel, am acting as a scribe for Dr. Vanna Scotland,  I have reviewed the above documentation for accuracy and completeness, and I agree with the above.   Vanna Scotland, MD  I spent 60 total minutes on the day of the encounter including pre-visit review of the medical record, face-to-face time with the patient, and post visit ordering of labs/imaging/tests.

## 2020-01-05 ENCOUNTER — Ambulatory Visit (INDEPENDENT_AMBULATORY_CARE_PROVIDER_SITE_OTHER): Payer: Medicaid Other | Admitting: Urology

## 2020-01-05 ENCOUNTER — Other Ambulatory Visit: Payer: Self-pay

## 2020-01-05 ENCOUNTER — Encounter: Payer: Self-pay | Admitting: Urology

## 2020-01-05 VITALS — BP 113/79 | HR 128 | Ht 64.0 in

## 2020-01-05 DIAGNOSIS — N3941 Urge incontinence: Secondary | ICD-10-CM

## 2020-01-05 DIAGNOSIS — R32 Unspecified urinary incontinence: Secondary | ICD-10-CM | POA: Diagnosis not present

## 2020-01-05 DIAGNOSIS — K5909 Other constipation: Secondary | ICD-10-CM | POA: Diagnosis not present

## 2020-01-05 DIAGNOSIS — N390 Urinary tract infection, site not specified: Secondary | ICD-10-CM

## 2020-01-05 DIAGNOSIS — N393 Stress incontinence (female) (male): Secondary | ICD-10-CM

## 2020-01-05 LAB — BLADDER SCAN AMB NON-IMAGING: Scan Result: 9

## 2020-01-05 MED ORDER — OXYBUTYNIN CHLORIDE ER 15 MG PO TB24
15.0000 mg | ORAL_TABLET | Freq: Every day | ORAL | 2 refills | Status: DC
Start: 2020-01-05 — End: 2020-01-27

## 2020-01-05 MED ORDER — INCONTINENCE SUPPLIES MISC: 3 refills | Status: DC

## 2020-01-05 MED ORDER — DOCUSATE SODIUM 100 MG PO CAPS: 100.0000 mg | ORAL_CAPSULE | Freq: Two times a day (BID) | ORAL | 0 refills | Status: DC

## 2020-01-05 NOTE — Patient Instructions (Signed)
Tracking Your Bladder Symptoms    Patient Name:___________________________________________________   Sample: Day   Daytime Voids  Nighttime Voids Urgency for the Day(0-4) Number of Accidents Beverage Comments  Monday IIII II 2 I Water IIII Coffee  I      Week Starting:____________________________________   Day Daytime  Voids Nighttime  Voids Urgency for  The Day(0-4) Number of Accidents Beverages Comments                                                           This week my symptoms were:  O much better  O better O the same O worse   Please get over the counter probiotics and cranberry tablets.

## 2020-01-07 LAB — URINALYSIS, COMPLETE
Bilirubin, UA: NEGATIVE
Glucose, UA: NEGATIVE
Ketones, UA: NEGATIVE
Nitrite, UA: NEGATIVE
Protein,UA: NEGATIVE
RBC, UA: NEGATIVE
Specific Gravity, UA: 1.025 (ref 1.005–1.030)
Urobilinogen, Ur: 0.2 mg/dL (ref 0.2–1.0)
pH, UA: 5.5 (ref 5.0–7.5)

## 2020-01-07 LAB — MICROSCOPIC EXAMINATION: Epithelial Cells (non renal): >10 /hpf — AB (ref 0–10)

## 2020-01-27 ENCOUNTER — Other Ambulatory Visit: Payer: Self-pay | Admitting: Urology

## 2020-02-09 ENCOUNTER — Ambulatory Visit: Payer: Self-pay | Admitting: Urology

## 2020-04-28 ENCOUNTER — Encounter: Payer: Self-pay | Admitting: Nurse Practitioner

## 2020-04-28 ENCOUNTER — Ambulatory Visit: Payer: Medicaid Other | Admitting: Nurse Practitioner

## 2020-04-28 ENCOUNTER — Other Ambulatory Visit: Payer: Self-pay

## 2020-04-28 VITALS — BP 120/80 | HR 103 | Temp 98.0°F | Resp 16 | Ht 63.0 in | Wt 161.8 lb

## 2020-04-28 DIAGNOSIS — M5116 Intervertebral disc disorders with radiculopathy, lumbar region: Secondary | ICD-10-CM | POA: Diagnosis not present

## 2020-04-28 DIAGNOSIS — E2839 Other primary ovarian failure: Secondary | ICD-10-CM

## 2020-04-28 DIAGNOSIS — F988 Other specified behavioral and emotional disorders with onset usually occurring in childhood and adolescence: Secondary | ICD-10-CM

## 2020-04-28 DIAGNOSIS — F411 Generalized anxiety disorder: Secondary | ICD-10-CM | POA: Diagnosis not present

## 2020-04-28 NOTE — Progress Notes (Signed)
[facility] [address] [place], [place] [address]  Internal MEDICINE  Office Visit Note  Patient Name: Molly Bray  [id number]  [id number]  Date of Service: 05/18/2020   Complaints/HPI Pt is here for establishment of PCP. Chief Complaint  Patient presents with  . New Patient (Initial Visit)    after epidural pt is still experencing pain in mid back and lower back and right pain in right knee down, if pt tries to stand knee will give out, knee brace helps but pt no longer has the brace, pt wants hormones checked  . Anxiety  . Depression  . Asthma  . policy update form   The patient is here to establish primary care provider. She was seeing OB/GYN provider while pregnant. Her son is now 84 months old. She currently sees, or has seen psychiatrist at [facility]. She states that it is very difficult for her to get there. She has three young children and does not have dependable transportation. She is currently on prescriptions for Latuda, seroquel, Vyvanse, and alprazolam to manage anxiety, depression, and ADD. She also states that she has had chronic pain in the lower back since she had epidural in labor with her third son. She states that pain is burning in nature. It radiates to her hip and down her leg. Pain makes it difficult for her to walk at times. To this point, she has not had any imaging done of her lower back.  The patient states that she frequently feels fatigue. She has history of high cholesterol in the past. Has not had routine, fasting labs done since prior to her last pregnancy.    Current Medication: Outpatient Encounter Medications as of 04/28/2020  Medication Sig  . QUEtiapine (SEROQUEL) 50 MG tablet Take by mouth.  . [DISCONTINUED] ALPRAZolam (XANAX) 0.25 MG tablet Take 0.25 mg by mouth at bedtime as needed for anxiety. (Patient not taking: Reported on 05/10/2020)  . [DISCONTINUED] atorvastatin (LIPITOR) 10 MG tablet Take 10 mg by mouth daily.  .  [DISCONTINUED] VYVANSE 30 MG capsule Take 30 mg by mouth at bedtime. (Patient not taking: Reported on 05/10/2020)  . [DISCONTINUED] docusate sodium (COLACE) 100 MG capsule Take 1 capsule (100 mg total) by mouth 2 (two) times daily. (Patient not taking: Reported on 04/28/2020)  . [DISCONTINUED] Incontinence Supplies MISC Depends diapers use as needed (Patient not taking: Reported on 04/28/2020)  . [DISCONTINUED] Lurasidone HCl (LATUDA) 120 MG TABS Take by mouth. (Patient not taking: Reported on 04/28/2020)  . [DISCONTINUED] norethindrone-ethinyl estradiol (JUNEL FE 1/20) 1-20 MG-MCG tablet Take 1 tablet by mouth daily. (Patient not taking: Reported on 04/28/2020)  . [DISCONTINUED] oxybutynin (DITROPAN XL) 15 MG 24 hr tablet TAKE 1 TABLET BY MOUTH EVERY DAY (Patient not taking: Reported on 04/28/2020)  . [DISCONTINUED] Prenatal Vit-Fe Fumarate-FA (PRENATAL VITAMIN PO) Take by mouth. (Patient not taking: Reported on 04/28/2020)   No facility-administered encounter medications on file as of 04/28/2020.    Surgical History: Past Surgical History:  Procedure Laterality Date  . CHOLECYSTECTOMY N/A 03/07/2018   Procedure: LAPAROSCOPIC CHOLECYSTECTOMY;  Surgeon: Sung Amabile, DO;  Location: [facility] ORS;  Service: General;  Laterality: N/A;  . galbladder removed  2019  . KIDNEY SURGERY      Medical History: Past Medical History:  Diagnosis Date  . ADHD   . Anxiety   . Asthma   . Chronic kidney disease    KIDNEY REFLUX  . Depression   . GERD (gastroesophageal reflux disease)  WITH PREGNANCY ONLY  . Headache    MIGRAINES  . History of methicillin resistant staphylococcus aureus (MRSA) 2008  . IBS (irritable bowel syndrome)    constipation  . Pinched nerve   . PTSD (post-traumatic stress disorder)   . Pyelonephritis   . Recurrent UTI     Family History: Family History  Problem Relation Age of Onset  . COPD Mother   . Diabetes Mother   . ADD / ADHD Mother   . ADD / ADHD Sister   .  Learning disabilities Sister   . Diabetes Maternal Aunt   . COPD Father   . Diabetes Father   . Hypertension Neg Hx   . Ovarian cancer Neg Hx   . Breast cancer Neg Hx   . Heart disease Neg Hx     Social History   Socioeconomic History  . Marital status: Legally Separated    Spouse name: Not on file  . Number of children: Not on file  . Years of education: Not on file  . Highest education level: Not on file  Occupational History  . Not on file  Tobacco Use  . Smoking status: Current Every Day Smoker    Packs/day: 0.50    Years: 8.00    Pack years: 4.00    Types: Cigarettes  . Smokeless tobacco: Never Used  Vaping Use  . Vaping Use: Never used  Substance and Sexual Activity  . Alcohol use: No  . Drug use: No  . Sexual activity: Yes    Birth control/protection: None    Comment: planning tubal  Other Topics Concern  . Not on file  Social History Narrative  . Not on file   Social Determinants of Health   Financial Resource Strain:   . Difficulty of Paying Living Expenses: Not on file  Food Insecurity:   . Worried About Programme researcher, broadcasting/film/video in the Last Year: Not on file  . Ran Out of Food in the Last Year: Not on file  Transportation Needs:   . Lack of Transportation (Medical): Not on file  . Lack of Transportation (Non-Medical): Not on file  Physical Activity:   . Days of Exercise per Week: Not on file  . Minutes of Exercise per Session: Not on file  Stress:   . Feeling of Stress : Not on file  Social Connections:   . Frequency of Communication with Friends and Family: Not on file  . Frequency of Social Gatherings with Friends and Family: Not on file  . Attends Religious Services: Not on file  . Active Member of Clubs or Organizations: Not on file  . Attends Banker Meetings: Not on file  . Marital Status: Not on file  Intimate Partner Violence:   . Fear of Current or Ex-Partner: Not on file  . Emotionally Abused: Not on file  . Physically  Abused: Not on file  . Sexually Abused: Not on file     Review of Systems  Constitutional: Positive for activity change and fatigue. Negative for chills and unexpected weight change.       Decreased activity level due to back pain which radiates to the hip and legs.   HENT: Negative for congestion, postnasal drip, rhinorrhea, sneezing and sore throat.   Respiratory: Negative for cough, chest tightness, shortness of breath and wheezing.   Cardiovascular: Negative for chest pain and palpitations.  Gastrointestinal: Negative for abdominal pain, constipation, diarrhea, nausea and vomiting.  Endocrine: Negative for cold intolerance, heat intolerance,  polydipsia and polyuria.  Musculoskeletal: Positive for arthralgias, back pain and myalgias. Negative for joint swelling and neck pain.       Severe lower back pain, worse with exertion. Radiates to the hips and legs. Makes routine activity limited.   Skin: Negative for rash.  Allergic/Immunologic: Negative for environmental allergies.  Neurological: Negative for dizziness, tremors, numbness and headaches.  Hematological: Negative for adenopathy. Does not bruise/bleed easily.  Psychiatric/Behavioral: Positive for decreased concentration and dysphoric mood. Negative for behavioral problems (Depression), sleep disturbance and suicidal ideas. The patient is nervous/anxious.        Patient has been routinely seeing psychiatry.     Today's Vitals   04/28/20 1445  BP: 120/80  Pulse: (!) 103  Resp: 16  Temp: 98 F (36.7 C)  SpO2: 99%  Weight: 161 lb 12.8 oz (73.4 kg)  Height: 5\' 3"  (1.6 m)   Body mass index is 28.66 kg/m.  Physical Exam Vitals and nursing note reviewed.  Constitutional:      General: She is not in acute distress.    Appearance: Normal appearance. She is well-developed. She is not diaphoretic.  HENT:     Head: Normocephalic and atraumatic.     Nose: Nose normal.     Mouth/Throat:     Pharynx: No oropharyngeal exudate.   Eyes:     Pupils: Pupils are equal, round, and reactive to light.  Neck:     Thyroid: No thyromegaly.     Vascular: No JVD.     Trachea: No tracheal deviation.  Cardiovascular:     Rate and Rhythm: Normal rate and regular rhythm.     Heart sounds: Normal heart sounds. No murmur heard.  No friction rub. No gallop.   Pulmonary:     Effort: Pulmonary effort is normal. No respiratory distress.     Breath sounds: Normal breath sounds. No wheezing or rales.  Chest:     Chest wall: No tenderness.  Abdominal:     Palpations: Abdomen is soft.  Musculoskeletal:        General: Normal range of motion.     Cervical back: Normal range of motion and neck supple.     Comments: The patient has moderate to severe lower back pain. Stretching the lower back by bending at the waist does help the pain. Exertion makes the pain worse. No visible or palpable abnormalities are noted today.   Lymphadenopathy:     Cervical: No cervical adenopathy.  Skin:    General: Skin is warm and dry.  Neurological:     Mental Status: She is alert and oriented to person, place, and time.     Cranial Nerves: No cranial nerve deficit.  Psychiatric:        Attention and Perception: Attention and perception normal.        Mood and Affect: Mood is anxious and depressed.        Speech: Speech normal.        Behavior: Behavior normal. Behavior is cooperative.        Thought Content: Thought content normal.        Cognition and Memory: Cognition and memory normal.        Judgment: Judgment normal.   Assessment/Plan: 1. Lumbar disc disease with radiculopathy Will get x-ray of the lumbar spine for further evaluation.  - DG Lumbar Spine Complete; Future  2. Estrogen deficiency Check labs, including reporductive hormones   3. Generalized anxiety disorder Refer to local psychiatry for further evaluation and  continued management.  - Ambulatory referral to Psychiatry  4. Attention deficit disorder (ADD) in adult Refer  to local psychiatry for further evaluation and continued management.  - Ambulatory referral to Psychiatry  General Counseling: Jacolyn ReedyElizabeth verbalizes understanding of the findings of todays visit and agrees with plan of treatment. I have discussed any further diagnostic evaluation that may be needed or ordered today. We also reviewed her medications today. she has been encouraged to call the office with any questions or concerns that should arise related to todays visit.    Counseling:  This patient was seen by Vincent GrosHeather Brandilynn Taormina FNP Collaboration with Dr Lyndon CodeFozia M Khan as a part of collaborative care agreement  Orders Placed This Encounter  Procedures  . DG Lumbar Spine Complete  . Ambulatory referral to Psychiatry      Time spent: 45 Minutes

## 2020-04-29 ENCOUNTER — Ambulatory Visit
Admission: RE | Admit: 2020-04-29 | Discharge: 2020-04-29 | Disposition: A | Discharge status: Home / Self Care | Payer: Medicaid Other | Source: Ambulatory Visit | Attending: Nurse Practitioner | Admitting: Nurse Practitioner

## 2020-04-29 DIAGNOSIS — M5116 Intervertebral disc disorders with radiculopathy, lumbar region: Secondary | ICD-10-CM

## 2020-05-02 ENCOUNTER — Other Ambulatory Visit: Payer: Self-pay | Admitting: Nurse Practitioner

## 2020-05-02 NOTE — Progress Notes (Signed)
Please let the patient know that x-ray of her lumbar spine does show some mild degenerative changes without any acute abnormalities. If she wants, I can refer her to orthopedics for further evaluation. Thanks.

## 2020-05-03 ENCOUNTER — Telehealth: Payer: Self-pay

## 2020-05-03 ENCOUNTER — Other Ambulatory Visit: Payer: Self-pay | Admitting: Nurse Practitioner

## 2020-05-03 DIAGNOSIS — M5116 Intervertebral disc disorders with radiculopathy, lumbar region: Secondary | ICD-10-CM

## 2020-05-03 LAB — COMPREHENSIVE METABOLIC PANEL
ALT: 8 IU/L (ref 0–32)
AST: 12 IU/L (ref 0–40)
Albumin/Globulin Ratio: 2.2 (ref 1.2–2.2)
Albumin: 4.7 g/dL (ref 3.9–5.0)
Alkaline Phosphatase: 104 IU/L (ref 44–121)
BUN/Creatinine Ratio: 13 (ref 9–23)
BUN: 10 mg/dL (ref 6–20)
Bilirubin Total: <0.2 mg/dL (ref 0.0–1.2)
CO2: 23 mmol/L (ref 20–29)
Calcium: 9.7 mg/dL (ref 8.7–10.2)
Chloride: 105 mmol/L (ref 96–106)
Creatinine, Ser: 0.76 mg/dL (ref 0.57–1.00)
GFR calc Af Amer: 126 mL/min/{1.73_m2} (ref >59)
GFR calc non Af Amer: 109 mL/min/{1.73_m2} (ref >59)
Globulin, Total: 2.1 g/dL (ref 1.5–4.5)
Glucose: 85 mg/dL (ref 65–99)
Potassium: 4.6 mmol/L (ref 3.5–5.2)
Sodium: 142 mmol/L (ref 134–144)
Total Protein: 6.8 g/dL (ref 6.0–8.5)

## 2020-05-03 LAB — CBC
Hematocrit: 43.3 % (ref 34.0–46.6)
Hemoglobin: 15 g/dL (ref 11.1–15.9)
MCH: 31 pg (ref 26.6–33.0)
MCHC: 34.6 g/dL (ref 31.5–35.7)
MCV: 90 fL (ref 79–97)
Platelets: 249 10*3/uL (ref 150–450)
RBC: 4.84 x10E6/uL (ref 3.77–5.28)
RDW: 12.8 % (ref 11.7–15.4)
WBC: 7.6 10*3/uL (ref 3.4–10.8)

## 2020-05-03 LAB — T4, FREE: Free T4: 1.17 ng/dL (ref 0.82–1.77)

## 2020-05-03 LAB — LIPID PANEL WITH LDL/HDL RATIO
Cholesterol, Total: 194 mg/dL (ref 100–199)
HDL: 40 mg/dL (ref >39)
LDL Chol Calc (NIH): 142 mg/dL — ABNORMAL HIGH (ref 0–99)
LDL/HDL Ratio: 3.6 ratio — ABNORMAL HIGH (ref 0.0–3.2)
Triglycerides: 66 mg/dL (ref 0–149)
VLDL Cholesterol Cal: 12 mg/dL (ref 5–40)

## 2020-05-03 LAB — VITAMIN D 25 HYDROXY (VIT D DEFICIENCY, FRACTURES): Vit D, 25-Hydroxy: 32.6 ng/mL (ref 30.0–100.0)

## 2020-05-03 LAB — TSH: TSH: 0.397 u[IU]/mL — ABNORMAL LOW (ref 0.450–4.500)

## 2020-05-03 LAB — ESTRADIOL: Estradiol: 108 pg/mL

## 2020-05-03 LAB — HGB A1C W/O EAG: Hgb A1c MFr Bld: 5.3 % (ref 4.8–5.6)

## 2020-05-03 LAB — FSH/LH
FSH: 2.6 m[IU]/mL
LH: 8.3 m[IU]/mL

## 2020-05-03 MED ORDER — MELOXICAM 7.5 MG PO TABS: 7.5000 mg | ORAL_TABLET | Freq: Two times a day (BID) | ORAL | 2 refills | Status: DC | PRN

## 2020-05-03 NOTE — Telephone Encounter (Signed)
Added meloxicam 7.5mg twice daily as needed for pain/inflammaiton. Refer to orthopedics.  

## 2020-05-03 NOTE — Progress Notes (Signed)
Added meloxicam 7.5mg  twice daily as needed for pain/inflammaiton. Refer to orthopedics.

## 2020-05-03 NOTE — Telephone Encounter (Signed)
Called and informed pt that we sent in prescription to pharmacy. dbs

## 2020-05-03 NOTE — Progress Notes (Signed)
Patient has been advised and aware we are making her a referral for ortho. Molly Bray

## 2020-05-10 ENCOUNTER — Other Ambulatory Visit: Payer: Self-pay

## 2020-05-10 ENCOUNTER — Ambulatory Visit (INDEPENDENT_AMBULATORY_CARE_PROVIDER_SITE_OTHER): Payer: Medicaid Other | Admitting: Nurse Practitioner

## 2020-05-10 ENCOUNTER — Encounter: Payer: Self-pay | Admitting: Nurse Practitioner

## 2020-05-10 VITALS — BP 130/80 | HR 82 | Temp 97.8°F | Resp 16 | Ht 63.0 in | Wt 159.0 lb

## 2020-05-10 DIAGNOSIS — E782 Mixed hyperlipidemia: Secondary | ICD-10-CM

## 2020-05-10 DIAGNOSIS — M5136 Other intervertebral disc degeneration, lumbar region: Secondary | ICD-10-CM | POA: Diagnosis not present

## 2020-05-10 DIAGNOSIS — F411 Generalized anxiety disorder: Secondary | ICD-10-CM

## 2020-05-10 DIAGNOSIS — E0590 Thyrotoxicosis, unspecified without thyrotoxic crisis or storm: Secondary | ICD-10-CM

## 2020-05-10 DIAGNOSIS — M5116 Intervertebral disc disorders with radiculopathy, lumbar region: Secondary | ICD-10-CM | POA: Diagnosis not present

## 2020-05-10 MED ORDER — ROSUVASTATIN CALCIUM 5 MG PO TABS: 5.0000 mg | ORAL_TABLET | Freq: Every day | ORAL | 3 refills | Status: DC

## 2020-05-10 MED ORDER — PREGABALIN 25 MG PO CAPS: 25.0000 mg | ORAL_CAPSULE | Freq: Two times a day (BID) | ORAL | 0 refills | Status: DC

## 2020-05-10 MED ORDER — CELECOXIB 100 MG PO CAPS: 100.0000 mg | ORAL_CAPSULE | Freq: Two times a day (BID) | ORAL | 2 refills | Status: DC

## 2020-05-10 NOTE — Progress Notes (Signed)
[facility] [address] [place], [place] [id number]  Internal MEDICINE  Office Visit Note  Patient Name: Molly Bray  1996  [id number]  Date of Service: 05/29/2020  Chief Complaint  Patient presents with  . Follow-up  . Depression  . Gastroesophageal Reflux  . Quality Metric Gaps    Hep C,covid,Flu  . controlled substance form    reviewed with PT    The patient is here for follow up visit. She had labs done showing possible hyperthyroid. TSH was 0.397 with normal Free T4. She does have mild thyromegaly. Her lipid panel was also elevated. LDL was 142 with LDL/HDL ratio 3.6. She has been on cholesterol medication in the past but has been off for some time. She continues to have chronic pain in the lower back since she had epidural in labor with her third son. She states that pain is burning in nature. It radiates to her hip and down her leg. Pain makes it difficult for her to walk at times. She had x-ray of the lumbar spine. The x-rays did show mild degenerative disc disease there is mild disc space narrowing at the L4/L5 region.       Current Medication: Outpatient Encounter Medications as of 05/10/2020  Medication Sig  . meloxicam (MOBIC) 7.5 MG tablet Take 1 tablet (7.5 mg total) by mouth 2 (two) times daily as needed for pain.  Marland Kitchen. QUEtiapine (SEROQUEL) 50 MG tablet Take by mouth.  . [DISCONTINUED] atorvastatin (LIPITOR) 10 MG tablet Take 10 mg by mouth daily.  . ADDERALL XR 15 MG 24 hr capsule Take 15 mg by mouth daily.  . celecoxib (CELEBREX) 100 MG capsule Take 1 capsule (100 mg total) by mouth 2 (two) times daily.  . clonazePAM (KLONOPIN) 0.5 MG tablet Take 0.5 mg by mouth 2 (two) times daily.  . pregabalin (LYRICA) 25 MG capsule Take 1 capsule (25 mg total) by mouth 2 (two) times daily.  . rosuvastatin (CRESTOR) 5 MG tablet Take 1 tablet (5 mg total) by mouth daily.  . [DISCONTINUED] ALPRAZolam (XANAX) 0.25 MG tablet Take 0.25 mg by mouth at  bedtime as needed for anxiety. (Patient not taking: Reported on 05/10/2020)  . [DISCONTINUED] VYVANSE 30 MG capsule Take 30 mg by mouth at bedtime. (Patient not taking: Reported on 05/10/2020)   No facility-administered encounter medications on file as of 05/10/2020.    Surgical History: Past Surgical History:  Procedure Laterality Date  . CHOLECYSTECTOMY N/A 03/07/2018   Procedure: LAPAROSCOPIC CHOLECYSTECTOMY;  Surgeon: Sung AmabileSakai, Isami, DO;  Location: [facility] ORS;  Service: General;  Laterality: N/A;  . galbladder removed  2019  . KIDNEY SURGERY      Medical History: Past Medical History:  Diagnosis Date  . ADHD   . Anxiety   . Asthma   . Chronic kidney disease    KIDNEY REFLUX  . Depression   . GERD (gastroesophageal reflux disease)    WITH PREGNANCY ONLY  . Headache    MIGRAINES  . History of methicillin resistant staphylococcus aureus (MRSA) 2008  . IBS (irritable bowel syndrome)    constipation  . Pinched nerve   . PTSD (post-traumatic stress disorder)   . Pyelonephritis   . Recurrent UTI     Family History: Family History  Problem Relation Age of Onset  . COPD Mother   . Diabetes Mother   . ADD / ADHD Mother   . ADD / ADHD Sister   . Learning disabilities Sister   . Diabetes Maternal  Aunt   . COPD Father   . Diabetes Father   . Hypertension Neg Hx   . Ovarian cancer Neg Hx   . Breast cancer Neg Hx   . Heart disease Neg Hx     Social History   Socioeconomic History  . Marital status: Legally Separated    Spouse name: Not on file  . Number of children: Not on file  . Years of education: Not on file  . Highest education level: Not on file  Occupational History  . Not on file  Tobacco Use  . Smoking status: Current Every Day Smoker    Packs/day: 0.50    Years: 8.00    Pack years: 4.00    Types: Cigarettes  . Smokeless tobacco: Never Used  Vaping Use  . Vaping Use: Never used  Substance and Sexual Activity  . Alcohol use: No  . Drug use: No  .  Sexual activity: Yes    Birth control/protection: None    Comment: planning tubal  Other Topics Concern  . Not on file  Social History Narrative  . Not on file   Social Determinants of Health   Financial Resource Strain:   . Difficulty of Paying Living Expenses: Not on file  Food Insecurity:   . Worried About Programme researcher, broadcasting/film/video in the Last Year: Not on file  . Ran Out of Food in the Last Year: Not on file  Transportation Needs:   . Lack of Transportation (Medical): Not on file  . Lack of Transportation (Non-Medical): Not on file  Physical Activity:   . Days of Exercise per Week: Not on file  . Minutes of Exercise per Session: Not on file  Stress:   . Feeling of Stress : Not on file  Social Connections:   . Frequency of Communication with Friends and Family: Not on file  . Frequency of Social Gatherings with Friends and Family: Not on file  . Attends Religious Services: Not on file  . Active Member of Clubs or Organizations: Not on file  . Attends Banker Meetings: Not on file  . Marital Status: Not on file  Intimate Partner Violence:   . Fear of Current or Ex-Partner: Not on file  . Emotionally Abused: Not on file  . Physically Abused: Not on file  . Sexually Abused: Not on file      Review of Systems  Constitutional: Positive for activity change and fatigue. Negative for chills and unexpected weight change.       Decreased activity level due to back pain which radiates to the hip and legs.   HENT: Negative for congestion, postnasal drip, rhinorrhea, sneezing and sore throat.   Respiratory: Negative for cough, chest tightness, shortness of breath and wheezing.   Cardiovascular: Negative for chest pain and palpitations.  Gastrointestinal: Negative for abdominal pain, constipation, diarrhea, nausea and vomiting.  Endocrine: Negative for cold intolerance, heat intolerance, polydipsia and polyuria.       Mild enlargement of thyroid. Abnormal thyroid panel    Musculoskeletal: Positive for arthralgias, back pain and myalgias. Negative for joint swelling and neck pain.       Severe lower back pain, worse with exertion. Radiates to the hips and legs. Makes routine activity limited.   Skin: Negative for rash.  Allergic/Immunologic: Negative for environmental allergies.  Neurological: Negative for dizziness, tremors, numbness and headaches.  Hematological: Negative for adenopathy. Does not bruise/bleed easily.  Psychiatric/Behavioral: Positive for decreased concentration and dysphoric mood. Negative for  behavioral problems (Depression), sleep disturbance and suicidal ideas. The patient is nervous/anxious.        Patient has been routinely seeing psychiatry.     Today's Vitals   05/10/20 1525  BP: 130/80  Pulse: 82  Resp: 16  Temp: 97.8 F (36.6 C)  SpO2: 99%  Weight: 159 lb (72.1 kg)  Height: 5\' 3"  (1.6 m)   Body mass index is 28.17 kg/m.  Physical Exam Vitals and nursing note reviewed.  Constitutional:      General: She is not in acute distress.    Appearance: Normal appearance. She is well-developed. She is not diaphoretic.  HENT:     Head: Normocephalic and atraumatic.     Nose: Nose normal.     Mouth/Throat:     Pharynx: No oropharyngeal exudate.  Eyes:     Pupils: Pupils are equal, round, and reactive to light.  Neck:     Thyroid: Thyromegaly present.     Vascular: No JVD.     Trachea: No tracheal deviation.  Cardiovascular:     Rate and Rhythm: Normal rate and regular rhythm.     Heart sounds: Normal heart sounds. No murmur heard.  No friction rub. No gallop.   Pulmonary:     Effort: Pulmonary effort is normal. No respiratory distress.     Breath sounds: Normal breath sounds. No wheezing or rales.  Chest:     Chest wall: No tenderness.  Abdominal:     Palpations: Abdomen is soft.  Musculoskeletal:        General: Normal range of motion.     Cervical back: Normal range of motion and neck supple.     Comments: The  patient has moderate to severe lower back pain. Stretching the lower back by bending at the waist does help the pain. Exertion makes the pain worse. No visible or palpable abnormalities are noted today.   Lymphadenopathy:     Cervical: No cervical adenopathy.  Skin:    General: Skin is warm and dry.  Neurological:     General: No focal deficit present.     Mental Status: She is alert and oriented to person, place, and time.     Cranial Nerves: No cranial nerve deficit.  Psychiatric:        Attention and Perception: Attention and perception normal.        Mood and Affect: Mood is anxious and depressed.        Speech: Speech normal.        Behavior: Behavior normal. Behavior is cooperative.        Thought Content: Thought content normal.        Cognition and Memory: Cognition and memory normal.        Judgment: Judgment normal.    Assessment/Plan: 1. Lumbar disc disease with radiculopathy X-ray of lumbar spine showed mild degenerative disc disease with disc space narrowing at L4/L5. Add celebrex 100mg  twice daily as needed for pain/inflammation. Also add low dose lyrica 25mg  twice daily. Will get MRI of lumbar spine for further evaluation.  - celecoxib (CELEBREX) 100 MG capsule; Take 1 capsule (100 mg total) by mouth 2 (two) times daily.  Dispense: 60 capsule; Refill: 2 - pregabalin (LYRICA) 25 MG capsule; Take 1 capsule (25 mg total) by mouth 2 (two) times daily.  Dispense: 60 capsule; Refill: 0  2. Other intervertebral disc degeneration, lumbar region X-ray of lumbar spine showed mild degenerative disc disease with disc space narrowing at L4/L5. Add celebrex 100mg   twice daily as needed for pain/inflammation. Also add low dose lyrica 25mg  twice daily. Will get MRI of lumbar spine for further evaluation.  - MR Lumbar Spine Wo Contrast; Future  3. Mixed hyperlipidemia Reviewed labs showing elevated LDL and LDL/HDL ratio of 3.6. Start rosuvastatin 5mg  at bedtime. Recheck lipid panel and CMP  prior to next visit.  - rosuvastatin (CRESTOR) 5 MG tablet; Take 1 tablet (5 mg total) by mouth daily.  Dispense: 90 tablet; Refill: 3  4. Overactive thyroid gland Recent TSH 0.397 with normal Free T4 and mild thyromegaly. Will get thyroid ultrasound for further evaluation.  - THYROID; Future  5. Generalized anxiety disorder Continue regular visits with psychiatry.  General Counseling: understanding of the findings of todays visit and agrees with plan of treatment. I have discussed any further diagnostic evaluation that may be needed or ordered today. We also reviewed her medications today. she has been encouraged to call the office with any questions or concerns that should arise related to todays visit.  This patient was seen by [place] FNP Collaboration with Dr Jacolyn Reedy as a part of collaborative care agreement  Orders Placed This Encounter  Procedures  . MR Lumbar Spine Wo Contrast  . Vincent Gros THYROID    Meds ordered this encounter  Medications  . celecoxib (CELEBREX) 100 MG capsule    Sig: Take 1 capsule (100 mg total) by mouth 2 (two) times daily.    Dispense:  60 capsule    Refill:  2    D/c meloxicam due to ineffectiveness    Order Specific Question:   Supervising Provider    Answer:   Lyndon Code [[id number]]  . pregabalin (LYRICA) 25 MG capsule    Sig: Take 1 capsule (25 mg total) by mouth 2 (two) times daily.    Dispense:  60 capsule    Refill:  0    Order Specific Question:   Supervising Provider    Answer:   [place] [[id number]]  . rosuvastatin (CRESTOR) 5 MG tablet    Sig: Take 1 tablet (5 mg total) by mouth daily.    Dispense:  90 tablet    Refill:  3    Order Specific Question:   Supervising Provider    Answer:   Lyndon Code [[id number]]    Total time spent: 40 Minutes   Time spent includes review of chart, medications, test results, and follow up plan with the patient.      Dr Lyndon Code Internal medicine

## 2020-05-18 DIAGNOSIS — F988 Other specified behavioral and emotional disorders with onset usually occurring in childhood and adolescence: Secondary | ICD-10-CM | POA: Insufficient documentation

## 2020-05-18 DIAGNOSIS — F411 Generalized anxiety disorder: Secondary | ICD-10-CM | POA: Insufficient documentation

## 2020-05-18 DIAGNOSIS — M5116 Intervertebral disc disorders with radiculopathy, lumbar region: Secondary | ICD-10-CM | POA: Insufficient documentation

## 2020-05-18 DIAGNOSIS — E2839 Other primary ovarian failure: Secondary | ICD-10-CM | POA: Insufficient documentation

## 2020-05-29 DIAGNOSIS — E782 Mixed hyperlipidemia: Secondary | ICD-10-CM | POA: Insufficient documentation

## 2020-05-29 DIAGNOSIS — M5136 Other intervertebral disc degeneration, lumbar region: Secondary | ICD-10-CM | POA: Insufficient documentation

## 2020-05-29 DIAGNOSIS — E0590 Thyrotoxicosis, unspecified without thyrotoxic crisis or storm: Secondary | ICD-10-CM | POA: Insufficient documentation

## 2020-05-30 ENCOUNTER — Other Ambulatory Visit: Payer: Self-pay | Admitting: Nurse Practitioner

## 2020-05-31 LAB — TSH: TSH: 0.773 u[IU]/mL (ref 0.450–4.500)

## 2020-05-31 LAB — THYROID ANTIBODIES
Thyroglobulin Antibody: <1 IU/mL (ref 0.0–0.9)
Thyroperoxidase Ab SerPl-aCnc: <8 IU/mL (ref 0–34)

## 2020-05-31 LAB — T3: T3, Total: 184 ng/dL — ABNORMAL HIGH (ref 71–180)

## 2020-05-31 LAB — T4, FREE: Free T4: 1.53 ng/dL (ref 0.82–1.77)

## 2020-06-03 ENCOUNTER — Other Ambulatory Visit: Payer: Medicaid Other

## 2020-06-17 ENCOUNTER — Ambulatory Visit: Payer: Medicaid Other | Admitting: Nurse Practitioner

## 2020-06-21 ENCOUNTER — Telehealth: Payer: Self-pay

## 2020-06-21 NOTE — Telephone Encounter (Signed)
She should see ortho or neurosurgery.,;)

## 2020-06-21 NOTE — Telephone Encounter (Signed)
Patient insurance denied patients mri. Molly Bray

## 2020-06-21 NOTE — Progress Notes (Signed)
Improved thyroid panel. Discuss with patient 12/20

## 2020-06-21 NOTE — Telephone Encounter (Signed)
Is there something I can do instead, like a CT?

## 2020-06-22 ENCOUNTER — Ambulatory Visit: Payer: Medicaid Other | Admitting: Nurse Practitioner

## 2020-06-22 ENCOUNTER — Other Ambulatory Visit: Payer: Medicaid Other

## 2020-07-03 ENCOUNTER — Emergency Department: Payer: Medicaid Other

## 2020-07-03 ENCOUNTER — Other Ambulatory Visit: Payer: Self-pay

## 2020-07-03 ENCOUNTER — Encounter: Payer: Self-pay | Admitting: Radiology

## 2020-07-03 ENCOUNTER — Emergency Department
Admission: EM | Admit: 2020-07-03 | Discharge: 2020-07-03 | Disposition: A | Discharge status: Home / Self Care | Payer: Medicaid Other | Attending: Emergency Medicine | Admitting: Emergency Medicine

## 2020-07-03 DIAGNOSIS — M542 Cervicalgia: Secondary | ICD-10-CM | POA: Diagnosis not present

## 2020-07-03 DIAGNOSIS — Z5321 Procedure and treatment not carried out due to patient leaving prior to being seen by health care provider: Secondary | ICD-10-CM | POA: Insufficient documentation

## 2020-07-03 DIAGNOSIS — R079 Chest pain, unspecified: Secondary | ICD-10-CM | POA: Insufficient documentation

## 2020-07-03 NOTE — ED Notes (Signed)
Patient called for a room with no answer. 
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
## 2020-07-04 ENCOUNTER — Telehealth: Payer: Self-pay

## 2020-07-04 ENCOUNTER — Ambulatory Visit: Payer: Medicaid Other | Admitting: Nurse Practitioner

## 2020-07-04 NOTE — Telephone Encounter (Signed)
Discharge letter mailed  

## 2020-07-04 NOTE — Telephone Encounter (Signed)
Discharge letter place in scan also

## 2021-04-04 ENCOUNTER — Encounter: Payer: Self-pay | Admitting: General Surgery

## 2021-11-12 ENCOUNTER — Emergency Department
Admission: EM | Admit: 2021-11-12 | Discharge: 2021-11-12 | Disposition: A | Discharge status: Home / Self Care | Payer: Medicaid Other | Attending: Emergency Medicine | Admitting: Emergency Medicine

## 2021-11-12 DIAGNOSIS — E860 Dehydration: Secondary | ICD-10-CM | POA: Diagnosis not present

## 2021-11-12 DIAGNOSIS — R000 Tachycardia, unspecified: Secondary | ICD-10-CM | POA: Diagnosis not present

## 2021-11-12 DIAGNOSIS — F1123 Opioid dependence with withdrawal: Secondary | ICD-10-CM | POA: Insufficient documentation

## 2021-11-12 DIAGNOSIS — R112 Nausea with vomiting, unspecified: Secondary | ICD-10-CM | POA: Diagnosis not present

## 2021-11-12 DIAGNOSIS — D72829 Elevated white blood cell count, unspecified: Secondary | ICD-10-CM | POA: Diagnosis not present

## 2021-11-12 DIAGNOSIS — F1193 Opioid use, unspecified with withdrawal: Secondary | ICD-10-CM

## 2021-11-12 LAB — COMPREHENSIVE METABOLIC PANEL
ALT: 11 U/L (ref 0–44)
AST: 17 U/L (ref 15–41)
Albumin: 4.4 g/dL (ref 3.5–5.0)
Alkaline Phosphatase: 88 U/L (ref 38–126)
Anion gap: 9 (ref 5–15)
BUN: 10 mg/dL (ref 6–20)
CO2: 28 mmol/L (ref 22–32)
Calcium: 9.8 mg/dL (ref 8.9–10.3)
Chloride: 103 mmol/L (ref 98–111)
Creatinine, Ser: 0.88 mg/dL (ref 0.44–1.00)
GFR, Estimated: >60 mL/min (ref >60)
Glucose, Bld: 78 mg/dL (ref 70–99)
Potassium: 4.7 mmol/L (ref 3.5–5.1)
Sodium: 140 mmol/L (ref 135–145)
Total Bilirubin: 0.5 mg/dL (ref 0.3–1.2)
Total Protein: 7.6 g/dL (ref 6.5–8.1)

## 2021-11-12 LAB — URINALYSIS, ROUTINE W REFLEX MICROSCOPIC
Bilirubin Urine: NEGATIVE
Glucose, UA: NEGATIVE mg/dL
Hgb urine dipstick: NEGATIVE
Ketones, ur: NEGATIVE mg/dL
Leukocytes,Ua: NEGATIVE
Nitrite: NEGATIVE
Protein, ur: NEGATIVE mg/dL
Specific Gravity, Urine: 1.016 (ref 1.005–1.030)
pH: 8 (ref 5.0–8.0)

## 2021-11-12 LAB — CBC WITH DIFFERENTIAL/PLATELET
Abs Immature Granulocytes: 0.04 10*3/uL (ref 0.00–0.07)
Basophils Absolute: 0.1 10*3/uL (ref 0.0–0.1)
Basophils Relative: 1 %
Eosinophils Absolute: 0.1 10*3/uL (ref 0.0–0.5)
Eosinophils Relative: 1 %
HCT: 49.1 % — ABNORMAL HIGH (ref 36.0–46.0)
Hemoglobin: 15.9 g/dL — ABNORMAL HIGH (ref 12.0–15.0)
Immature Granulocytes: 0 %
Lymphocytes Relative: 27 %
Lymphs Abs: 3.4 10*3/uL (ref 0.7–4.0)
MCH: 29.9 pg (ref 26.0–34.0)
MCHC: 32.4 g/dL (ref 30.0–36.0)
MCV: 92.3 fL (ref 80.0–100.0)
Monocytes Absolute: 0.7 10*3/uL (ref 0.1–1.0)
Monocytes Relative: 5 %
Neutro Abs: 8.6 10*3/uL — ABNORMAL HIGH (ref 1.7–7.7)
Neutrophils Relative %: 66 %
Platelets: 347 10*3/uL (ref 150–400)
RBC: 5.32 MIL/uL — ABNORMAL HIGH (ref 3.87–5.11)
RDW: 13 % (ref 11.5–15.5)
WBC: 12.9 10*3/uL — ABNORMAL HIGH (ref 4.0–10.5)
nRBC: 0 % (ref 0.0–0.2)

## 2021-11-12 LAB — POC URINE PREG, ED: Preg Test, Ur: NEGATIVE

## 2021-11-12 MED ORDER — LACTATED RINGERS IV BOLUS
1000.0000 mL | Freq: Once | INTRAVENOUS | Status: AC
  Administered 2021-11-12: 1000 mL via INTRAVENOUS

## 2021-11-12 MED ORDER — ONDANSETRON 4 MG PO TBDP: 4.0000 mg | ORAL_TABLET | Freq: Three times a day (TID) | ORAL | 0 refills | Status: DC | PRN

## 2021-11-12 MED ORDER — ONDANSETRON 4 MG PO TBDP
4.0000 mg | ORAL_TABLET | Freq: Once | ORAL | Status: AC
  Administered 2021-11-12: 4 mg via ORAL
  Filled 2021-11-12: qty 1

## 2021-11-12 NOTE — ED Provider Notes (Signed)
? ?[facility] ?Provider Note ? ? ? Event Date/Time  ? First MD Initiated Contact with Patient 11/12/21 1922   ?  (approximate) ? ? ?History  ? ?detox ? ? ?HPI ? ?Molly Bray is a 26 y.o. female who presents to the ED for evaluation of detox ?  ?I reviewed clinic visit from 04/2020.  History of HLD, GAD.  ? ?Patient presents to the ED requesting assistance with withdrawing from opiates.  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  Has used needles in the past, but no recent IVDU.  Denies additional recreational drugs alongside the opiates.  ? ?Reports she has detox placement on the second, but is concerned that she will not make it until then and that she will relapse.  She reports she needs help getting through the withdrawals with the shakes, nausea and emesis. ? ?Physical Exam  ? ?Triage Vital Signs: ?ED Triage Vitals  ?Enc Vitals Group  ?   BP 11/12/21 1837 115/70  ?   Pulse Rate 11/12/21 1837 (!) 113  ?   Resp 11/12/21 1837 18  ?   Temp 11/12/21 1837 97.9 ?F (36.6 ?C)  ?   Temp Source 11/12/21 1837 Oral  ?   SpO2 11/12/21 1837 99 %  ?   Weight 11/12/21 1831 178 lb 9.2 oz (81 kg)  ?   Height --   ?   Head Circumference --   ?   Peak Flow --   ?   Pain Score 11/12/21 1831 10  ?   Pain Loc --   ?   Pain Edu? --   ?   Excl. in [place]? --   ? ? ?Most recent vital signs: ?Vitals:  ? 11/12/21 1837  ?BP: 115/70  ?Pulse: (!) 113  ?Resp: 18  ?Temp: 97.9 ?F (36.6 ?C)  ?SpO2: 99%  ? ? ?General: Awake, no distress.  Curled up in a ball.  Was around to her back and is conversational.  Seems uncomfortable.  Dry mucous membranes ?CV:  Good peripheral perfusion.  Tachycardic and regular ?Resp:  Normal effort.  ?Abd:  No distention.  Soft and benign ?MSK:  No deformity noted.  ?Neuro:  No focal deficits appreciated. ?Other:   ? ? ?ED Results / Procedures / Treatments  ? ?Labs ?(all labs ordered are listed, but only abnormal results are displayed) ?Labs Reviewed  ?CBC  WITH DIFFERENTIAL/PLATELET - Abnormal; Notable for the following components:  ?    Result Value  ? WBC 12.9 (*)   ? RBC 5.32 (*)   ? Hemoglobin 15.9 (*)   ? HCT 49.1 (*)   ? Neutro Abs 8.6 (*)   ? All other components within normal limits  ?URINALYSIS, ROUTINE W REFLEX MICROSCOPIC - Abnormal; Notable for the following components:  ? Color, Urine YELLOW (*)   ? APPearance HAZY (*)   ? All other components within normal limits  ?COMPREHENSIVE METABOLIC PANEL  ?POC URINE PREG, ED  ? ? ?EKG ?Sinus tachycardia with rate of 129 bpm.  Rightward axis and normal intervals.  No evidence of acute ischemia. ? ?RADIOLOGY ? ? ?Official radiology report(s): ?No results found. ? ?PROCEDURES and INTERVENTIONS: ? ?Procedures ? ?Medications  ?ondansetron (ZOFRAN-ODT) disintegrating tablet 4 mg (4 mg Oral Given 11/12/21 1842)  ?lactated ringers bolus 1,000 mL (0 mLs Intravenous Stopped 11/12/21 2156)  ? ? ? ?IMPRESSION / MDM / ASSESSMENT AND PLAN / ED COURSE  ?I reviewed the  triage vital signs and the nursing notes. ? ?26 year old female undergoing opiate withdrawals as she awaits for detox placement in the next 2 days presents to the ED with dehydration associated withdrawals, improving with IV rehydration and suitable for continued outpatient management thereafter.  Presents tachycardic, resolving with IV fluids.  Blood work with nonspecific leukocytosis, normal metabolic panel.  Noninfectious urine.  She clinically is withdrawing, but not severe.  Zofran and IV fluids are sufficient with improvement of her symptoms and then she requests discharge.  She has detox placement in the next couple days.  We discussed Zofran at home, return precautions and she is suitable for outpatient management.  No evidence of psychiatric emergency, suicidality or needs for emergent psychiatric evaluation. ? ?Clinical Course as of 11/12/21 2159  ?Sun Nov 12, 2021  ?2131 Patient sitting up in bed, feels better and requesting discharge.  Requesting nausea  medicine. [DS]  ?  ?Clinical Course User Index ?[DS] Vladimir Crofts, MD  ? ? ? ?FINAL CLINICAL IMPRESSION(S) / ED DIAGNOSES  ? ?Final diagnoses:  ?Opiate withdrawal ([place])  ? ? ? ?Rx / DC Orders  ? ?ED Discharge Orders   ? ?      Ordered  ?  ondansetron (ZOFRAN-ODT) 4 MG disintegrating tablet  Every 8 hours PRN       ? 11/12/21 2132  ? ?  ?  ? ?  ? ? ? ?Note:  This document was prepared using Dragon voice recognition software and may include unintentional dictation errors. ?  ?Vladimir Crofts, MD ?11/12/21 2200 ? ?

## 2021-11-12 NOTE — ED Triage Notes (Signed)
Pt comes pov for detox from heroin and fentanyl. Has been sober 4 days and is having n/v, feeling like she's going to pass out.  ?

## 2022-05-12 ENCOUNTER — Other Ambulatory Visit: Payer: Self-pay

## 2022-05-12 ENCOUNTER — Encounter: Payer: Self-pay | Admitting: Emergency Medicine

## 2022-05-12 ENCOUNTER — Emergency Department
Admission: EM | Admit: 2022-05-12 | Discharge: 2022-05-12 | Disposition: A | Discharge status: Home / Self Care | Payer: Medicaid Other | Attending: Emergency Medicine | Admitting: Emergency Medicine

## 2022-05-12 ENCOUNTER — Emergency Department: Payer: Medicaid Other

## 2022-05-12 DIAGNOSIS — G8929 Other chronic pain: Secondary | ICD-10-CM

## 2022-05-12 DIAGNOSIS — J45909 Unspecified asthma, uncomplicated: Secondary | ICD-10-CM | POA: Insufficient documentation

## 2022-05-12 DIAGNOSIS — J069 Acute upper respiratory infection, unspecified: Secondary | ICD-10-CM | POA: Insufficient documentation

## 2022-05-12 DIAGNOSIS — M25532 Pain in left wrist: Secondary | ICD-10-CM | POA: Insufficient documentation

## 2022-05-12 DIAGNOSIS — K047 Periapical abscess without sinus: Secondary | ICD-10-CM | POA: Insufficient documentation

## 2022-05-12 MED ORDER — NAPROXEN 500 MG PO TABS
500.0000 mg | ORAL_TABLET | Freq: Two times a day (BID) | ORAL | 0 refills | Status: DC
  Filled 2022-05-12 – 2022-06-01 (×2): qty 20, 10d supply, fill #0

## 2022-05-12 MED ORDER — CLINDAMYCIN HCL 300 MG PO CAPS
300.0000 mg | ORAL_CAPSULE | Freq: Three times a day (TID) | ORAL | 0 refills | Status: AC
  Filled 2022-05-12 – 2022-06-01 (×2): qty 30, 10d supply, fill #0

## 2022-05-12 NOTE — Discharge Instructions (Addendum)
Call make an appointment with Dr. Posey Pronto who is on-call for orthopedics if you continue have problems with your wrist.  Also you are being treated for a dental abscess and will need to see a dentist.  A list of dental clinics is listed on your discharge papers along with a separate paper with stars besides the clinics that have walk-in hours.  Begin taking antibiotics as directed and also the anti-inflammatory should be taken with food which should help with your wrist pain.

## 2022-05-12 NOTE — ED Provider Notes (Signed)
[facility] Provider Note    Event Date/Time   First MD Initiated Contact with Patient 05/12/22 [phone]     (approximate)   History   Wrist Pain, Dental Pain, and Cough   HPI  Molly Bray is a 26 y.o. female   presents to the ED with multiple complaints.  Patient states that her left wrist has been hurting for approximately 6 months.  Patient states that she had been wearing a brace but took it off.  She denies any recent injury to her wrist and has not been assessed for this particular complaint.  She also has dental pain that has worsened in the last several days.  Patient states that 1 month ago she believes that she "cracked it".  Patient also complains of an intermittent productive cough without fever, chills, nausea or vomiting.  Patient has a history of anxiety, GERD, PTSD, IBS, asthma, ADHD, depression.      Physical Exam   Triage Vital Signs: ED Triage Vitals  Enc Vitals Group     BP 05/12/22 0759 123/85     Pulse Rate 05/12/22 0759 86     Resp 05/12/22 0759 18     Temp 05/12/22 0759 98.5 F (36.9 C)     Temp Source 05/12/22 0759 Oral     SpO2 05/12/22 0759 100 %     Weight 05/12/22 0751 178 lb 9.2 oz (81 kg)     Height 05/12/22 0751 5\' 5"  (1.651 m)     Head Circumference --      Peak Flow --      Pain Score 05/12/22 0751 7     Pain Loc --      Pain Edu? --      Excl. in [place]? --     Most recent vital signs: Vitals:   05/12/22 0759  BP: 123/85  Pulse: 86  Resp: 18  Temp: 98.5 F (36.9 C)  SpO2: 100%     General: Awake, no distress.  Alert, talkative, cooperative. CV:  Good peripheral perfusion.  Heart regular rate and rhythm. Resp:  Normal effort.  Lungs are clear bilaterally. Abd:  No distention.  Other:  On examination of the left wrist no gross deformity or soft tissue edema.  Radial pulse present.  Patient is able to flex and extend without restriction.  No crepitus noted.  On examination of the left upper posterior  molar in poor repair.  Gums surrounding this are edematous and tender.  No active drainage from this area is noted.  Nasal mucosa congested.  Posterior pharynx without exudate or erythema.  Neck supple without cervical lymphadenopathy.   ED Results / Procedures / Treatments   Labs (all labs ordered are listed, but only abnormal results are displayed) Labs Reviewed - No data to display    RADIOLOGY Left wrist x-ray images were reviewed by myself independent of the radiologist and interpreted as negative.  Ischial radiology report is negative.    PROCEDURES:  Critical Care performed:   Procedures   MEDICATIONS ORDERED IN ED: Medications - No data to display   IMPRESSION / MDM / [place] / ED COURSE  I reviewed the triage vital signs and the nursing notes.   Differential diagnosis includes, but is not limited to, left wrist pain chronic, tendinitis, dental pain, dental abscess, upper respiratory infection.  26 year old female presents to the ED with chronic left wrist pain with x-rays unremarkable.  Dental abscess with need of dental attention.  A prescription for antibiotics was sent to the pharmacy along with anti-inflammatories which will also help with her chronic left wrist pain.  Patient is strongly encouraged to call the dental clinics listed on her discharge papers and a list of walk-in clinics was also given to her.  She was also made aware that she can take Tylenol with this medication if additional pain medication is needed.      Patient's presentation is most consistent with acute complicated illness / injury requiring diagnostic workup.  FINAL CLINICAL IMPRESSION(S) / ED DIAGNOSES   Final diagnoses:  Dental abscess  Chronic wrist pain, left  Upper respiratory tract infection, unspecified type     Rx / DC Orders   ED Discharge Orders          Ordered    clindamycin (CLEOCIN) 300 MG capsule  3 times daily        05/12/22 0930    naproxen  (NAPROSYN) 500 MG tablet  2 times daily with meals        05/12/22 0930             Note:  This document was prepared using Dragon voice recognition software and may include unintentional dictation errors.   Tommi Rumps, PA-C 05/12/22 1541    Shaune Pollack, MD 05/12/22 2029

## 2022-05-12 NOTE — ED Triage Notes (Signed)
Pt reports hurt left wrist about 6 months ago but not sure how she hurt it. Pt states wore a brace for a while and then took it off and it hit again and it is still painful.   Reports left upper tooth started hurting about a month ago. Pt states she was chewing heard a crack and thinks she broke it. Pt reports pus coming from it and it is painful and states she does not have a dentist or dental insurance.   Reports cough for a month that is intermittently productive. Pt reports sometimes she will have phlem in it and other times it will just be dry.

## 2022-05-13 ENCOUNTER — Other Ambulatory Visit: Payer: Self-pay

## 2022-06-01 ENCOUNTER — Other Ambulatory Visit: Payer: Self-pay

## 2022-06-08 ENCOUNTER — Other Ambulatory Visit: Payer: Self-pay

## 2022-07-15 ENCOUNTER — Other Ambulatory Visit: Payer: Self-pay

## 2022-07-15 ENCOUNTER — Emergency Department

## 2022-07-15 ENCOUNTER — Emergency Department
Admission: EM | Admit: 2022-07-15 | Discharge: 2022-07-16 | Disposition: A | Discharge status: Home / Self Care | Attending: Emergency Medicine | Admitting: Emergency Medicine

## 2022-07-15 DIAGNOSIS — R42 Dizziness and giddiness: Secondary | ICD-10-CM | POA: Insufficient documentation

## 2022-07-15 DIAGNOSIS — T71193A Asphyxiation due to mechanical threat to breathing due to other causes, assault, initial encounter: Secondary | ICD-10-CM | POA: Diagnosis present

## 2022-07-15 DIAGNOSIS — T71194A Asphyxiation due to mechanical threat to breathing due to other causes, undetermined, initial encounter: Secondary | ICD-10-CM

## 2022-07-15 LAB — CBC WITH DIFFERENTIAL/PLATELET
Abs Immature Granulocytes: 0.03 10*3/uL (ref 0.00–0.07)
Basophils Absolute: 0.1 10*3/uL (ref 0.0–0.1)
Basophils Relative: 1 %
Eosinophils Absolute: 0.2 10*3/uL (ref 0.0–0.5)
Eosinophils Relative: 1 %
HCT: 45.7 % (ref 36.0–46.0)
Hemoglobin: 14.9 g/dL (ref 12.0–15.0)
Immature Granulocytes: 0 %
Lymphocytes Relative: 25 %
Lymphs Abs: 3.5 10*3/uL (ref 0.7–4.0)
MCH: 30.5 pg (ref 26.0–34.0)
MCHC: 32.6 g/dL (ref 30.0–36.0)
MCV: 93.6 fL (ref 80.0–100.0)
Monocytes Absolute: 0.6 10*3/uL (ref 0.1–1.0)
Monocytes Relative: 5 %
Neutro Abs: 9.5 10*3/uL — ABNORMAL HIGH (ref 1.7–7.7)
Neutrophils Relative %: 68 %
Platelets: 335 10*3/uL (ref 150–400)
RBC: 4.88 MIL/uL (ref 3.87–5.11)
RDW: 13 % (ref 11.5–15.5)
WBC: 14 10*3/uL — ABNORMAL HIGH (ref 4.0–10.5)
nRBC: 0 % (ref 0.0–0.2)

## 2022-07-15 LAB — BASIC METABOLIC PANEL
Anion gap: 7 (ref 5–15)
BUN: 11 mg/dL (ref 6–20)
CO2: 28 mmol/L (ref 22–32)
Calcium: 9.1 mg/dL (ref 8.9–10.3)
Chloride: 106 mmol/L (ref 98–111)
Creatinine, Ser: 0.96 mg/dL (ref 0.44–1.00)
GFR, Estimated: >60 mL/min (ref >60)
Glucose, Bld: 96 mg/dL (ref 70–99)
Potassium: 4.2 mmol/L (ref 3.5–5.1)
Sodium: 141 mmol/L (ref 135–145)

## 2022-07-15 LAB — POC URINE PREG, ED: Preg Test, Ur: NEGATIVE
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
## 2022-07-16 ENCOUNTER — Emergency Department

## 2022-07-16 MED ORDER — IOHEXOL 350 MG/ML SOLN
75.0000 mL | Freq: Once | INTRAVENOUS | Status: AC | PRN
  Administered 2022-07-16: 75 mL via INTRAVENOUS
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
## 2022-07-16 NOTE — Discharge Instructions (Addendum)
Please take Tylenol and ibuprofen/Advil for your pain.  It is safe to take them together, or to alternate them every few hours.  Take up to 1000mg  of Tylenol at a time, up to 4 times per day.  Do not take more than 4000 mg of Tylenol in 24 hours.  For ibuprofen, take 400-600 mg, 3 - 4 times per day.      Interpersonal Violence   Interpersonal Violence aka Domestic Violence is defined as violence between people who have had a personal relationship. For example, someone you have ever dated, been married to or in a domestic partnership with. Someone with whom you have a child in common, or a current  household member.  Does one or more of the following  attempts to cause bodily injury, or intentionally causes bodily injury; places you or a member of your family or household in fear of imminent serious bodily injury; continued harassment that rises to such a level as to inflict substantial emotional distress; or commits any rape or sexual offense  You are not alone. Unfortunately domestic violence is very common. Domestic violence does not go away on its own and tends to get worse over time and more frequent. There are people who can help. There are resources included in these instructions. Evidence can be collected in case you want to notify law enforcement now or in the future. A forensic nurse can take photographs and create a medical/legal document of the incident. If you choose to report to law enforcement, they will request a copy of the chart which we can provide with your permission. We can call in social work or an advocate to help with safety planning and emergency placement in a shelter if you have no other safe options.  THE POLICE CAN HELP YOU:  Get to a safe place away from the violence.  Get information on how the court can help protect you against the violence.  Get necessary belongings from your home for you and your children.  Get copies of police reports about the violence.  File  a complaint in criminal court.  Find where local criminal and family courts are located.  [facility] with Shelter Obtaining a Landscape architect (50B) Careers adviser Support Group Product/process development scientist with domestic violence related criminal charges Child Conservation officer, historic buildings Assistance Enrollment Job Readiness Budget Counseling  Coaching and Mentoring  Call your local domestic violence program for additional information and support.   [facility] [place]   336-641-SAFE Crisis Line [place] [facility] [place]   [phone] Crisis Line [phone] Legal Aid of [facility] [place]  [phone]  [place] Crime Victim's Compensation:  The state advocates (contact information on flyer) or local advocates from a [facility] may be able to assist with completing the application; in order to be considered for assistance; the crime must be reported to law enforcement within 72 hours unless there is good cause for delay; you must fully cooperate with law enforcement and prosecution regarding the case; the crime must have occurred in [place] or in a state that does not offer crime victim compensation. [email or web address]

## 2022-07-23 ENCOUNTER — Other Ambulatory Visit: Payer: Self-pay

## 2022-08-02 ENCOUNTER — Emergency Department: Payer: Medicaid Other

## 2022-08-02 ENCOUNTER — Other Ambulatory Visit: Payer: Self-pay

## 2022-08-02 ENCOUNTER — Emergency Department
Admission: EM | Admit: 2022-08-02 | Discharge: 2022-08-02 | Disposition: A | Discharge status: Home / Self Care | Payer: Medicaid Other | Attending: Emergency Medicine | Admitting: Emergency Medicine

## 2022-08-02 DIAGNOSIS — R600 Localized edema: Secondary | ICD-10-CM | POA: Insufficient documentation

## 2022-08-02 DIAGNOSIS — N3000 Acute cystitis without hematuria: Secondary | ICD-10-CM | POA: Insufficient documentation

## 2022-08-02 DIAGNOSIS — D72829 Elevated white blood cell count, unspecified: Secondary | ICD-10-CM | POA: Insufficient documentation

## 2022-08-02 DIAGNOSIS — R609 Edema, unspecified: Secondary | ICD-10-CM

## 2022-08-02 DIAGNOSIS — J45909 Unspecified asthma, uncomplicated: Secondary | ICD-10-CM | POA: Insufficient documentation

## 2022-08-02 DIAGNOSIS — M79604 Pain in right leg: Secondary | ICD-10-CM | POA: Diagnosis present

## 2022-08-02 LAB — URINALYSIS, ROUTINE W REFLEX MICROSCOPIC
Bilirubin Urine: NEGATIVE
Glucose, UA: NEGATIVE mg/dL
Hgb urine dipstick: NEGATIVE
Ketones, ur: NEGATIVE mg/dL
Nitrite: NEGATIVE
Protein, ur: NEGATIVE mg/dL
Specific Gravity, Urine: 1.019 (ref 1.005–1.030)
pH: 6 (ref 5.0–8.0)

## 2022-08-02 LAB — CBC WITH DIFFERENTIAL/PLATELET
Abs Immature Granulocytes: 0.04 10*3/uL (ref 0.00–0.07)
Basophils Absolute: 0.1 10*3/uL (ref 0.0–0.1)
Basophils Relative: 1 %
Eosinophils Absolute: 0.4 10*3/uL (ref 0.0–0.5)
Eosinophils Relative: 3 %
HCT: 42.6 % (ref 36.0–46.0)
Hemoglobin: 14.1 g/dL (ref 12.0–15.0)
Immature Granulocytes: 0 %
Lymphocytes Relative: 26 %
Lymphs Abs: 2.9 10*3/uL (ref 0.7–4.0)
MCH: 30.9 pg (ref 26.0–34.0)
MCHC: 33.1 g/dL (ref 30.0–36.0)
MCV: 93.2 fL (ref 80.0–100.0)
Monocytes Absolute: 1 10*3/uL (ref 0.1–1.0)
Monocytes Relative: 9 %
Neutro Abs: 6.8 10*3/uL (ref 1.7–7.7)
Neutrophils Relative %: 61 %
Platelets: 319 10*3/uL (ref 150–400)
RBC: 4.57 MIL/uL (ref 3.87–5.11)
RDW: 13.2 % (ref 11.5–15.5)
WBC: 11.1 10*3/uL — ABNORMAL HIGH (ref 4.0–10.5)
nRBC: 0 % (ref 0.0–0.2)

## 2022-08-02 LAB — COMPREHENSIVE METABOLIC PANEL
ALT: 14 U/L (ref 0–44)
AST: 21 U/L (ref 15–41)
Albumin: 3.8 g/dL (ref 3.5–5.0)
Alkaline Phosphatase: 76 U/L (ref 38–126)
Anion gap: 7 (ref 5–15)
BUN: 10 mg/dL (ref 6–20)
CO2: 29 mmol/L (ref 22–32)
Calcium: 9.2 mg/dL (ref 8.9–10.3)
Chloride: 100 mmol/L (ref 98–111)
Creatinine, Ser: 0.67 mg/dL (ref 0.44–1.00)
GFR, Estimated: >60 mL/min (ref >60)
Glucose, Bld: 73 mg/dL (ref 70–99)
Potassium: 4.1 mmol/L (ref 3.5–5.1)
Sodium: 136 mmol/L (ref 135–145)
Total Bilirubin: 0.5 mg/dL (ref 0.3–1.2)
Total Protein: 6.8 g/dL (ref 6.5–8.1)

## 2022-08-02 LAB — POC URINE PREG, ED: Preg Test, Ur: NEGATIVE

## 2022-08-02 MED ORDER — DOXYCYCLINE HYCLATE 100 MG PO CAPS: 100.0000 mg | ORAL_CAPSULE | Freq: Two times a day (BID) | ORAL | 0 refills | Status: DC

## 2022-08-02 NOTE — ED Provider Notes (Signed)
[facility] Provider Note    Event Date/Time   First MD Initiated Contact with Patient 08/02/22 1732     (approximate)   History   Foot Pain   HPI  Molly Bray is a 27 y.o. female with history of asthma, pyelonephritis, MRSA, IVDU and as listed in the EMR presents to the emergency department for treatment and evaluation of pain and swelling to the RLE x 1 week with generalized erythema.  She has also had diffuse swelling to her face, hands, and some swelling to her left lower extremity since last night. She has had some lower extremity swelling in the past, but not like this. No history of PE or DVT.     Physical Exam   Triage Vital Signs: ED Triage Vitals  Enc Vitals Group     BP 08/02/22 1717 111/84     Pulse Rate 08/02/22 1717 (!) 112     Resp 08/02/22 1717 18     Temp 08/02/22 1717 98 F (36.7 C)     Temp src --      SpO2 08/02/22 1717 98 %     Weight 08/02/22 1736 160 lb 15 oz (73 kg)     Height 08/02/22 1736 5\' 1"  (1.549 m)     Head Circumference --      Peak Flow --      Pain Score 08/02/22 1716 5     Pain Loc --      Pain Edu? --      Excl. in [place]? --     Most recent vital signs: Vitals:   08/02/22 1717  BP: 111/84  Pulse: (!) 112  Resp: 18  Temp: 98 F (36.7 C)  SpO2: 98%     General: Awake, no distress.  CV:  Good peripheral perfusion.  Resp:  Normal effort.  Abd:  No distention.  Other:  Generalized, nonpitting edema under eyes bilaterally.  Nonpitting edema of right and left lower extremity   ED Results / Procedures / Treatments   Labs (all labs ordered are listed, but only abnormal results are displayed) Labs Reviewed  CBC WITH DIFFERENTIAL/PLATELET - Abnormal; Notable for the following components:      Result Value   WBC 11.1 (*)    All other components within normal limits  URINALYSIS, ROUTINE W REFLEX MICROSCOPIC - Abnormal; Notable for the following components:   Color, Urine YELLOW (*)     APPearance HAZY (*)    Leukocytes,Ua MODERATE (*)    Bacteria, UA RARE (*)    All other components within normal limits  COMPREHENSIVE METABOLIC PANEL  POC URINE PREG, ED     EKG  Not available in EMR   RADIOLOGY  Chest x-ray negative for acute cardiopulmonary concerns.  Image reviewed and interpreted by me.  Radiology report consistent with the same.   PROCEDURES:  Critical Care performed: No  Procedures   MEDICATIONS ORDERED IN ED: Medications - No data to display   IMPRESSION / MDM / [place] / ED COURSE  I reviewed the triage vital signs and the nursing notes.                              Differential diagnosis includes, but is not limited to,   Patient's presentation is most consistent with acute presentation with potential threat to life or bodily function.  27 year old female presenting to the emergency department for treatment  and evaluation of facial, hand, and lower extremity nonpitting edema.  Right lower extremity is mildly erythematous.  Plan will be to get a chest x-ray, labs, EKG, urinalysis, and ultrasound of the right lower extremity.  Patient aware and agreeable to this plan.  Labs are overall reassuring with the exception of very mild leukocytosis with at 11.1.  No shift.  Urinalysis shows moderate leukocytes, rare bacteria, 6-10 white blood cells and 6-10 red blood cells.  Patient states that she was diagnosed with a UTI yesterday at urgent care but did not take the antibiotics.  She is still having some dysuria. She will be treated with doxycycline due to allergies to sulfa and PCN.  Ultrasound of the right lower extremity is negative for DVT or other concerning findings.  Chest x-ray also reassuring.  ECG had been ordered, however it does not appear that it was obtained.  It is not available in the system and I do not find a paper copy.  ECG had been ordered more for baseline and not for acute chest pain or acute dyspnea.   No cause  found for patient's symptoms.  She states that she had a referral to cardiology for peripheral edema in the past but did not follow through.  She would like a second referral which will be provided at discharge.  ER return precautions discussed and she was also advised to follow-up with primary care.    FINAL CLINICAL IMPRESSION(S) / ED DIAGNOSES   Final diagnoses:  Acute cystitis without hematuria  Peripheral edema     Rx / DC Orders   ED Discharge Orders          Ordered    doxycycline (VIBRAMYCIN) 100 MG capsule  2 times daily        08/02/22 1936    Ambulatory referral to Cardiology       Comments: If you have not heard from the Cardiology office within the next 72 hours please call [phone].   08/02/22 1943             Note:  This document was prepared using Dragon voice recognition software and may include unintentional dictation errors.   Victorino Dike, FNP 08/02/22 3557    Lavonia Drafts, MD 08/05/22 1055

## 2022-08-02 NOTE — ED Triage Notes (Signed)
Pt comes with c/o bilateral foot pain and swelling. Pt states hand swelling as well. Pt states her face feels swollen too. Pt states this all started last night.

## 2022-08-02 NOTE — ED Triage Notes (Signed)
First Nurse Note:  Patient c/o bilateral foot swelling, R > L, hand swelling, feeling that balance is off since yesterday.  Patient is AAOx3.  Skin warm and dry. NAD.  MAE equally and strong. Ambulates independently.

## 2022-08-14 ENCOUNTER — Ambulatory Visit: Attending: Internal Medicine | Admitting: Internal Medicine

## 2022-09-09 ENCOUNTER — Other Ambulatory Visit: Payer: Self-pay

## 2022-09-09 ENCOUNTER — Emergency Department
Admission: EM | Admit: 2022-09-09 | Discharge: 2022-09-09 | Disposition: A | Discharge status: Home / Self Care | Payer: Medicaid Other | Attending: Emergency Medicine | Admitting: Emergency Medicine

## 2022-09-09 ENCOUNTER — Encounter: Payer: Self-pay | Admitting: Emergency Medicine

## 2022-09-09 DIAGNOSIS — R300 Dysuria: Secondary | ICD-10-CM | POA: Diagnosis present

## 2022-09-09 DIAGNOSIS — I2510 Atherosclerotic heart disease of native coronary artery without angina pectoris: Secondary | ICD-10-CM | POA: Diagnosis not present

## 2022-09-09 DIAGNOSIS — N189 Chronic kidney disease, unspecified: Secondary | ICD-10-CM | POA: Diagnosis not present

## 2022-09-09 DIAGNOSIS — D72829 Elevated white blood cell count, unspecified: Secondary | ICD-10-CM | POA: Diagnosis not present

## 2022-09-09 LAB — URINALYSIS, ROUTINE W REFLEX MICROSCOPIC
Bacteria, UA: NONE SEEN
Bilirubin Urine: NEGATIVE
Glucose, UA: NEGATIVE mg/dL
Hgb urine dipstick: NEGATIVE
Ketones, ur: 5 mg/dL — AB
Nitrite: NEGATIVE
Protein, ur: NEGATIVE mg/dL
Specific Gravity, Urine: 1.03 (ref 1.005–1.030)
pH: 5 (ref 5.0–8.0)

## 2022-09-09 LAB — CBC
HCT: 47.7 % — ABNORMAL HIGH (ref 36.0–46.0)
Hemoglobin: 15.6 g/dL — ABNORMAL HIGH (ref 12.0–15.0)
MCH: 30.4 pg (ref 26.0–34.0)
MCHC: 32.7 g/dL (ref 30.0–36.0)
MCV: 93 fL (ref 80.0–100.0)
Platelets: 399 10*3/uL (ref 150–400)
RBC: 5.13 MIL/uL — ABNORMAL HIGH (ref 3.87–5.11)
RDW: 13.1 % (ref 11.5–15.5)
WBC: 11.6 10*3/uL — ABNORMAL HIGH (ref 4.0–10.5)
nRBC: 0 % (ref 0.0–0.2)

## 2022-09-09 LAB — COMPREHENSIVE METABOLIC PANEL
ALT: 11 U/L (ref 0–44)
AST: 22 U/L (ref 15–41)
Albumin: 4.4 g/dL (ref 3.5–5.0)
Alkaline Phosphatase: 87 U/L (ref 38–126)
Anion gap: 9 (ref 5–15)
BUN: 13 mg/dL (ref 6–20)
CO2: 26 mmol/L (ref 22–32)
Calcium: 9.3 mg/dL (ref 8.9–10.3)
Chloride: 102 mmol/L (ref 98–111)
Creatinine, Ser: 0.83 mg/dL (ref 0.44–1.00)
GFR, Estimated: >60 mL/min (ref >60)
Glucose, Bld: 116 mg/dL — ABNORMAL HIGH (ref 70–99)
Potassium: 3.6 mmol/L (ref 3.5–5.1)
Sodium: 137 mmol/L (ref 135–145)
Total Bilirubin: 0.7 mg/dL (ref 0.3–1.2)
Total Protein: 7.5 g/dL (ref 6.5–8.1)

## 2022-09-09 LAB — POC URINE PREG, ED: Preg Test, Ur: NEGATIVE

## 2022-09-09 MED ORDER — AZITHROMYCIN 500 MG PO TABS
1000.0000 mg | ORAL_TABLET | Freq: Once | ORAL | Status: AC
  Administered 2022-09-09: 1000 mg via ORAL
  Filled 2022-09-09: qty 2

## 2022-09-09 MED ORDER — CEFTRIAXONE SODIUM 250 MG IJ SOLR
250.0000 mg | Freq: Once | INTRAMUSCULAR | Status: AC
  Administered 2022-09-09: 250 mg via INTRAMUSCULAR
  Filled 2022-09-09: qty 250

## 2022-09-09 MED ORDER — PHENAZOPYRIDINE HCL 200 MG PO TABS: 200.0000 mg | ORAL_TABLET | Freq: Three times a day (TID) | ORAL | 0 refills | Status: DC | PRN

## 2022-09-09 NOTE — ED Provider Notes (Signed)
[facility] Provider Note    Event Date/Time   First MD Initiated Contact with Patient 09/09/22 2114     (approximate)  History   Chief Complaint: Flank Pain  HPI  Molly Bray is a 27 y.o. female with a past medical history CAD, CKD, gastric reflux, presents emergency department for urinary frequency and discomfort.  According to the patient approximately 3 weeks ago she was treated for urinary tract infection.  States her symptoms improved however over the past week or so she has once again been experiencing burning with urination and discomfort.  She also states mild amount of vaginal discharge.  Currently on her menstrual cycle.  Denies any vomiting or diarrhea.  No fever.  States that history of UTIs to which this feels similar.  Physical Exam   Triage Vital Signs: ED Triage Vitals  Enc Vitals Group     BP 09/09/22 1954 130/75     Pulse Rate 09/09/22 1954 (!) 119     Resp 09/09/22 1954 16     Temp 09/09/22 1954 98.4 F (36.9 C)     Temp Source 09/09/22 1954 Oral     SpO2 09/09/22 1954 98 %     Weight 09/09/22 1953 165 lb (74.8 kg)     Height 09/09/22 1953 '5\' 3"'$  (1.6 m)     Head Circumference --      Peak Flow --      Pain Score 09/09/22 2004 8     Pain Loc --      Pain Edu? --      Excl. in [place]? --     Most recent vital signs: Vitals:   09/09/22 1954  BP: 130/75  Pulse: (!) 119  Resp: 16  Temp: 98.4 F (36.9 C)  SpO2: 98%    General: Awake, no distress.  CV:  Good peripheral perfusion.  Regular rate and rhythm  Resp:  Normal effort.  Equal breath sounds bilaterally.  Abd:  No distention.  Soft, nontender.  No rebound or guarding.  Benign abdomen.   ED Results / Procedures / Treatments   MEDICATIONS ORDERED IN ED: Medications  cefTRIAXone (ROCEPHIN) injection 250 mg (250 mg Intramuscular Given 09/09/22 2149)  azithromycin (ZITHROMAX) tablet 1,000 mg (1,000 mg Oral Given 09/09/22 2146)     IMPRESSION / MDM / ASSESSMENT  AND PLAN / ED COURSE  I reviewed the triage vital signs and the nursing notes.  Patient's presentation is most consistent with acute presentation with potential threat to life or bodily function.  Patient presents emergency department for urinary frequency and dysuria states has been intermittent over the past 3 weeks has taken 1 course of antibiotics but still had symptoms of the patient came to the emergency department.  States she is having some vaginal discharge.  She is agreeable to be tested for STDs.  Patient is refusing a pelvic exam currently states she is on her menstrual cycle and will follow-up with her doctor once her menstrual cycle is done.  I discussed with patient urinary STD testing she is agreeable.  Will check a urine sample we will check labs and continue to closely monitor.  Patient denies any fever.  Differential would include urinary tract infection, pyelonephritis, pelvic infection or STD.  Patient's urinalysis shows no concern for urinary tract infection.  Pregnancy test is negative, CBC shows slight leukocytosis otherwise negative.  Chemistry reassuring.  Patient had received an IM dose of Rocephin as well as Zithromax.  STD testing  has been sent patient states she will follow-up on MyChart for the results.  Given the patient's otherwise reassuring workup we will place the patient on [place] and have the patient follow-up with her doctor.  I have added on a urine culture to the patient's urinalysis.  FINAL CLINICAL IMPRESSION(S) / ED DIAGNOSES   Dysuria  Note:  This document was prepared using Dragon voice recognition software and may include unintentional dictation errors.   Harvest Dark, MD 09/09/22 2206

## 2022-09-09 NOTE — ED Notes (Signed)
This RN called lab to have run labs that were collected upon triage.

## 2022-09-09 NOTE — ED Triage Notes (Signed)
Pt arrives via POV with CC of R flank/back pain that started yesterday and worsened overnight. Pt reports pain with urination and significant hx of kidney/urinary tract infections. Denies nausea, vomiting, and diarrhea.

## 2022-09-09 NOTE — ED Notes (Signed)
This RN roomed pt. Pt is ambulatory and in no acute distress.

## 2022-09-10 LAB — CHLAMYDIA/NGC RT PCR (ARMC ONLY)
Chlamydia Tr: NOT DETECTED
N gonorrhoeae: NOT DETECTED

## 2022-09-11 LAB — URINE CULTURE: Culture: NO GROWTH

## 2022-10-10 ENCOUNTER — Ambulatory Visit: Attending: Internal Medicine | Admitting: Internal Medicine

## 2022-10-10 NOTE — Progress Notes (Deleted)
Cardiology Office Note:    Date:  10/10/2022   ID:  Molly Bray, DOB 1996-06-08, MRN [id number]  PCP:  [facility] Cardiologist:  None { Click to update primary MD,subspecialty MD or APP then REFRESH:1}    Referring MD: Victorino Dike, FNP   No chief complaint on file. ***  History of Present Illness:    Molly Bray is a 27 y.o. female with a hx of ***  Past Medical History:  Diagnosis Date   ADHD    Anxiety    Asthma    Chronic kidney disease    KIDNEY REFLUX   Depression    GERD (gastroesophageal reflux disease)    WITH PREGNANCY ONLY   Headache    MIGRAINES   History of methicillin resistant staphylococcus aureus (MRSA) 2008   IBS (irritable bowel syndrome)    constipation   Pinched nerve    PTSD (post-traumatic stress disorder)    Pyelonephritis    Recurrent UTI     Past Surgical History:  Procedure Laterality Date   CHOLECYSTECTOMY N/A 03/07/2018   Procedure: LAPAROSCOPIC CHOLECYSTECTOMY;  Surgeon: Benjamine Sprague, DO;  Location: [facility] ORS;  Service: General;  Laterality: N/A;   galbladder removed  2019   KIDNEY SURGERY      Current Medications: No outpatient medications have been marked as taking for the 10/10/22 encounter (Appointment) with Janina Mayo, MD.     Allergies:   Amoxicillin, Vancomycin, Penicillins, and Silver sulfadiazine   Social History   Socioeconomic History   Marital status: Legally Separated    Spouse name: Not on file   Number of children: Not on file   Years of education: Not on file   Highest education level: Not on file  Occupational History   Not on file  Tobacco Use   Smoking status: Every Day    Packs/day: 1.00    Years: 8.00    Additional pack years: 0.00    Total pack years: 8.00    Types: Cigarettes   Smokeless tobacco: Never  Vaping Use   Vaping Use: Never used  Substance and Sexual Activity   Alcohol use: No   Drug use: Not Currently   Sexual  activity: Yes    Birth control/protection: None    Comment: planning tubal  Other Topics Concern   Not on file  Social History Narrative   Not on file   Social Determinants of Health   Financial Resource Strain: Low Risk  (04/23/2019)   Overall Financial Resource Strain (CARDIA)    Difficulty of Paying Living Expenses: Not hard at all  Food Insecurity: No Food Insecurity (04/23/2019)   Hunger Vital Sign    Worried About Running Out of Food in the Last Year: Never true    Ran Out of Food in the Last Year: Never true  Transportation Needs: No Transportation Needs (04/23/2019)   PRAPARE - Hydrologist (Medical): No    Lack of Transportation (Non-Medical): No  Physical Activity: Unknown (04/23/2019)   Exercise Vital Sign    Days of Exercise per Week: Patient declined    Minutes of Exercise per Session: Patient declined  Stress: Stress Concern Present (04/23/2019)   [place]    Feeling of Stress : Very much  Social Connections: Moderately Isolated (04/23/2019)   Social Connection and Isolation Panel [NHANES]    Frequency of Communication with Friends and Family:  More than three times a week    Frequency of Social Gatherings with Friends and Family: Once a week    Attends Religious Services: More than 4 times per year    Active Member of [employer] or Organizations: No    Attends Archivist Meetings: Never    Marital Status: Separated     Family History: The patient's ***family history includes ADD / ADHD in her mother and sister; COPD in her father and mother; Diabetes in her father, maternal aunt, and mother; Learning disabilities in her sister. There is no history of Hypertension, Ovarian cancer, Breast cancer, or Heart disease.  ROS:   Please see the history of present illness.    *** All other systems reviewed and are negative.  EKGs/Labs/Other Studies Reviewed:    The  following studies were reviewed today: ***  EKG:  EKG is *** ordered today.  The ekg ordered today demonstrates ***  Recent Labs: 09/09/2022: ALT 11; BUN 13; Creatinine, Ser 0.83; Hemoglobin 15.6; Platelets 399; Potassium 3.6; Sodium 137  Recent Lipid Panel    Component Value Date/Time   CHOL 194 05/02/2020 1320   TRIG 66 05/02/2020 1320   HDL 40 05/02/2020 1320   LDLCALC 142 (H) 05/02/2020 1320     Risk Assessment/Calculations:   {Does this patient have ATRIAL FIBRILLATION?[phone]}  No BP recorded.  {Refresh Note OR Click here to enter BP  :1}***         Physical Exam:    VS:  There were no vitals taken for this visit.    Wt Readings from Last 3 Encounters:  09/09/22 165 lb (74.8 kg)  08/02/22 160 lb 15 oz (73 kg)  07/15/22 160 lb (72.6 kg)     GEN: *** Well nourished, well developed in no acute distress HEENT: Normal NECK: No JVD; No carotid bruits LYMPHATICS: No lymphadenopathy CARDIAC: ***RRR, no murmurs, rubs, gallops RESPIRATORY:  Clear to auscultation without rales, wheezing or rhonchi  ABDOMEN: Soft, non-tender, non-distended MUSCULOSKELETAL:  No edema; No deformity  SKIN: Warm and dry NEUROLOGIC:  Alert and oriented x 3 PSYCHIATRIC:  Normal affect   ASSESSMENT:    No diagnosis found. PLAN:    In order of problems listed above:  ***      {Are you ordering a CV Procedure (e.g. stress test, cath, DCCV, TEE, etc)?   Press F2        :[id number]    Medication Adjustments/Labs and Tests Ordered: Current medicines are reviewed at length with the patient today.  Concerns regarding medicines are outlined above.  No orders of the defined types were placed in this encounter.  No orders of the defined types were placed in this encounter.   There are no Patient Instructions on file for this visit.   Signed, Janina Mayo, MD  10/10/2022 3:26 PM    [place]
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
## 2022-10-29 ENCOUNTER — Ambulatory Visit: Payer: Medicaid Other | Admitting: Obstetrics and Gynecology

## 2022-10-29 DIAGNOSIS — Z01419 Encounter for gynecological examination (general) (routine) without abnormal findings: Secondary | ICD-10-CM

## 2022-10-29 DIAGNOSIS — Z124 Encounter for screening for malignant neoplasm of cervix: Secondary | ICD-10-CM

## 2022-12-16 ENCOUNTER — Emergency Department
Admission: EM | Admit: 2022-12-16 | Discharge: 2022-12-16 | Disposition: A | Discharge status: Home / Self Care | Payer: Medicaid Other | Attending: Emergency Medicine | Admitting: Emergency Medicine

## 2022-12-16 DIAGNOSIS — N189 Chronic kidney disease, unspecified: Secondary | ICD-10-CM | POA: Insufficient documentation

## 2022-12-16 DIAGNOSIS — T40601A Poisoning by unspecified narcotics, accidental (unintentional), initial encounter: Secondary | ICD-10-CM

## 2022-12-16 DIAGNOSIS — T401X1A Poisoning by heroin, accidental (unintentional), initial encounter: Secondary | ICD-10-CM | POA: Diagnosis not present

## 2022-12-16 LAB — COMPREHENSIVE METABOLIC PANEL
ALT: 20 U/L (ref 0–44)
AST: 23 U/L (ref 15–41)
Albumin: 4.6 g/dL (ref 3.5–5.0)
Alkaline Phosphatase: 102 U/L (ref 38–126)
Anion gap: 7 (ref 5–15)
BUN: 16 mg/dL (ref 6–20)
CO2: 28 mmol/L (ref 22–32)
Calcium: 9.1 mg/dL (ref 8.9–10.3)
Chloride: 103 mmol/L (ref 98–111)
Creatinine, Ser: 0.83 mg/dL (ref 0.44–1.00)
GFR, Estimated: >60 mL/min (ref >60)
Glucose, Bld: 75 mg/dL (ref 70–99)
Potassium: 3.6 mmol/L (ref 3.5–5.1)
Sodium: 138 mmol/L (ref 135–145)
Total Bilirubin: 0.5 mg/dL (ref 0.3–1.2)
Total Protein: 7.5 g/dL (ref 6.5–8.1)

## 2022-12-16 LAB — CBC
HCT: 41.2 % (ref 36.0–46.0)
Hemoglobin: 13.8 g/dL (ref 12.0–15.0)
MCH: 30.1 pg (ref 26.0–34.0)
MCHC: 33.5 g/dL (ref 30.0–36.0)
MCV: 89.8 fL (ref 80.0–100.0)
Platelets: 283 10*3/uL (ref 150–400)
RBC: 4.59 MIL/uL (ref 3.87–5.11)
RDW: 12.9 % (ref 11.5–15.5)
WBC: 9.5 10*3/uL (ref 4.0–10.5)
nRBC: 0 % (ref 0.0–0.2)

## 2022-12-16 MED ORDER — NALOXONE HCL 4 MG/0.1ML NA LIQD: NASAL | 2 refills | Status: DC

## 2022-12-16 MED ORDER — CHARCOAL ACTIVATED PO LIQD
50.0000 g | Freq: Once | ORAL | Status: AC
  Administered 2022-12-16: 50 g via ORAL
  Filled 2022-12-16: qty 240

## 2022-12-16 NOTE — ED Notes (Signed)
Patient advised to continue to monitor herself  closely for the next 24hrs. Patient understands the risks Involved in leaving early

## 2022-12-16 NOTE — Discharge Instructions (Addendum)
Please call the number provided for the facilities that can help with substance use disorders.  Please avoid opioids/substances in the future.  Return to the emergency department for any symptom concerning to yourself.
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
## 2022-12-16 NOTE — ED Notes (Signed)
Patient discharged by previous RN.  

## 2023-01-02 ENCOUNTER — Emergency Department
Admission: EM | Admit: 2023-01-02 | Discharge: 2023-01-02 | Discharge status: Against Medical Advice | Payer: Medicaid Other | Attending: Emergency Medicine | Admitting: Emergency Medicine

## 2023-01-02 ENCOUNTER — Other Ambulatory Visit: Payer: Self-pay

## 2023-01-02 ENCOUNTER — Encounter: Payer: Self-pay | Admitting: *Deleted

## 2023-01-02 ENCOUNTER — Emergency Department: Payer: Medicaid Other

## 2023-01-02 DIAGNOSIS — R42 Dizziness and giddiness: Secondary | ICD-10-CM | POA: Diagnosis not present

## 2023-01-02 DIAGNOSIS — R197 Diarrhea, unspecified: Secondary | ICD-10-CM | POA: Insufficient documentation

## 2023-01-02 DIAGNOSIS — R110 Nausea: Secondary | ICD-10-CM | POA: Insufficient documentation

## 2023-01-02 DIAGNOSIS — W0110XA Fall on same level from slipping, tripping and stumbling with subsequent striking against unspecified object, initial encounter: Secondary | ICD-10-CM | POA: Diagnosis not present

## 2023-01-02 DIAGNOSIS — S0181XA Laceration without foreign body of other part of head, initial encounter: Secondary | ICD-10-CM | POA: Insufficient documentation

## 2023-01-02 DIAGNOSIS — Z5321 Procedure and treatment not carried out due to patient leaving prior to being seen by health care provider: Secondary | ICD-10-CM | POA: Insufficient documentation

## 2023-01-02 LAB — BASIC METABOLIC PANEL
Anion gap: 10 (ref 5–15)
BUN: 11 mg/dL (ref 6–20)
CO2: 26 mmol/L (ref 22–32)
Calcium: 9.4 mg/dL (ref 8.9–10.3)
Chloride: 106 mmol/L (ref 98–111)
Creatinine, Ser: 0.95 mg/dL (ref 0.44–1.00)
GFR, Estimated: >60 mL/min (ref >60)
Glucose, Bld: 157 mg/dL — ABNORMAL HIGH (ref 70–99)
Potassium: 3.8 mmol/L (ref 3.5–5.1)
Sodium: 142 mmol/L (ref 135–145)

## 2023-01-02 LAB — CBC
HCT: 48 % — ABNORMAL HIGH (ref 36.0–46.0)
Hemoglobin: 15.8 g/dL — ABNORMAL HIGH (ref 12.0–15.0)
MCH: 29.9 pg (ref 26.0–34.0)
MCHC: 32.9 g/dL (ref 30.0–36.0)
MCV: 90.9 fL (ref 80.0–100.0)
Platelets: 355 10*3/uL (ref 150–400)
RBC: 5.28 MIL/uL — ABNORMAL HIGH (ref 3.87–5.11)
RDW: 13.2 % (ref 11.5–15.5)
WBC: 11.1 10*3/uL — ABNORMAL HIGH (ref 4.0–10.5)
nRBC: 0 % (ref 0.0–0.2)

## 2023-01-02 LAB — TROPONIN I (HIGH SENSITIVITY): Troponin I (High Sensitivity): <2 ng/L (ref <18)

## 2023-01-02 NOTE — ED Notes (Signed)
Pt visualized getting into car with mother
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
## 2023-01-02 NOTE — ED Notes (Signed)
Pt visualized walking out to the parking lot with her mother

## 2023-01-02 NOTE — ED Notes (Signed)
Car that patient got into visualized pulling out of the parking lot

## 2023-02-26 ENCOUNTER — Telehealth: Payer: Self-pay | Admitting: Family Medicine

## 2023-02-26 NOTE — Telephone Encounter (Signed)
Pt said she got a call from here and missed it, to please call her back

## 2023-02-27 NOTE — Telephone Encounter (Signed)
"  Not accepting calls at this time."

## 2023-02-27 NOTE — Telephone Encounter (Signed)
"  Cannot receive calls at this time." Berdie Ogren, RN

## 2023-02-27 NOTE — Telephone Encounter (Signed)
Not taking calls at this time

## 2023-02-28 ENCOUNTER — Ambulatory Visit: Payer: MEDICAID | Admitting: Advanced Practice Midwife

## 2023-02-28 ENCOUNTER — Encounter: Payer: Self-pay | Admitting: Advanced Practice Midwife

## 2023-02-28 DIAGNOSIS — A599 Trichomoniasis, unspecified: Secondary | ICD-10-CM

## 2023-02-28 DIAGNOSIS — F17200 Nicotine dependence, unspecified, uncomplicated: Secondary | ICD-10-CM

## 2023-02-28 DIAGNOSIS — Z113 Encounter for screening for infections with a predominantly sexual mode of transmission: Secondary | ICD-10-CM

## 2023-02-28 DIAGNOSIS — T7411XA Adult physical abuse, confirmed, initial encounter: Secondary | ICD-10-CM | POA: Insufficient documentation

## 2023-02-28 DIAGNOSIS — F4310 Post-traumatic stress disorder, unspecified: Secondary | ICD-10-CM

## 2023-02-28 DIAGNOSIS — F1120 Opioid dependence, uncomplicated: Secondary | ICD-10-CM

## 2023-02-28 DIAGNOSIS — Z79891 Long term (current) use of opiate analgesic: Secondary | ICD-10-CM | POA: Insufficient documentation

## 2023-02-28 DIAGNOSIS — T7411XS Adult physical abuse, confirmed, sequela: Secondary | ICD-10-CM

## 2023-02-28 LAB — HM HEPATITIS C SCREENING LAB: HM Hepatitis Screen: NEGATIVE

## 2023-02-28 LAB — WET PREP FOR TRICH, YEAST, CLUE
Trichomonas Exam: POSITIVE — AB
Yeast Exam: NEGATIVE

## 2023-02-28 LAB — PREGNANCY, URINE: Preg Test, Ur: NEGATIVE

## 2023-02-28 LAB — HM HIV SCREENING LAB: HM HIV Screening: NEGATIVE

## 2023-02-28 LAB — HEPATITIS B SURFACE ANTIGEN: Hepatitis B Surface Ag: NONREACTIVE

## 2023-02-28 MED ORDER — METRONIDAZOLE 500 MG PO TABS
500.0000 mg | ORAL_TABLET | Freq: Two times a day (BID) | ORAL | Status: AC
Start: 2023-02-28 — End: 2023-03-07

## 2023-02-28 NOTE — Progress Notes (Signed)
 Pt is here for STD visit.  Wet prep results reviewed with pt, treatment required per standing order.  Condoms declined.   The patient was dispensed metronidazole today. I provided counseling today regarding the medication. We discussed the medication, the side effects and when to call clinic. Patient given the opportunity to ask questions. Questions answered.  Gaspar Garbe, RN
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
## 2023-05-19 NOTE — Progress Notes (Deleted)
  Cardiology Office Note:  .   Date:  05/19/2023  ID:  Molly Bray, DOB 07/23/1995, MRN [id number] PCP: [facility]  [facility] Providers Cardiologist:  None { Click to update primary MD,subspecialty MD or APP then REFRESH:1}   History of Present Illness: .   Molly Bray is a 27 y.o. female with history of asthma, IVDU who presents for the evaluation of LE edema at the request of [facility]. Seen in ER 07/2022 for LE edema. DVT study negative. Referred to cardiology.     ***{There is no content from the last Narrative History section.}    ROS: All other ROS reviewed and negative. Pertinent positives noted in the HPI.     Studies Reviewed: .        LE Venous Duplex  IMPRESSION: No evidence of deep vein thrombosis in the right lower extremity.   Physical Exam:   VS:  There were no vitals taken for this visit.   Wt Readings from Last 3 Encounters:  01/02/23 160 lb (72.6 kg)  12/16/22 166 lb 14.2 oz (75.7 kg)  09/09/22 165 lb (74.8 kg)    GEN: Well nourished, well developed in no acute distress NECK: No JVD; No carotid bruits CARDIAC: ***RRR, no murmurs, rubs, gallops RESPIRATORY:  Clear to auscultation without rales, wheezing or rhonchi  ABDOMEN: Soft, non-tender, non-distended EXTREMITIES:  No edema; No deformity  ASSESSMENT AND PLAN: .   ***    {Are you ordering a CV Procedure (e.g. stress test, cath, DCCV, TEE, etc)?   Press F2        :[id number]}   Follow-up: No follow-ups on file.  Time Spent with Patient: I have spent a total of *** minutes caring for this patient today face to face, ordering and reviewing labs/tests, reviewing prior records/medical history, examining the patient, establishing an assessment and plan, communicating results/findings to the patient/family, and documenting in the medical record.   Signed, Lenna Gilford. Flora Lipps, MD, [facility]  [facility]  [address], [address] [place], [place]  [address] [phone]  7:34 PM

## 2023-05-20 ENCOUNTER — Ambulatory Visit: Payer: MEDICAID | Attending: Cardiology | Admitting: Cardiovascular Disease

## 2023-05-20 DIAGNOSIS — R600 Localized edema: Secondary | ICD-10-CM

## 2023-06-06 ENCOUNTER — Ambulatory Visit: Payer: Medicaid Other | Admitting: Cardiovascular Disease

## 2023-06-11 ENCOUNTER — Ambulatory Visit: Payer: MEDICAID

## 2023-06-11 VITALS — BP 112/75 | HR 99 | Wt 155.4 lb

## 2023-06-11 DIAGNOSIS — Z3201 Encounter for pregnancy test, result positive: Secondary | ICD-10-CM

## 2023-06-11 DIAGNOSIS — N912 Amenorrhea, unspecified: Secondary | ICD-10-CM

## 2023-06-11 LAB — POCT URINE PREGNANCY: Preg Test, Ur: POSITIVE — AB

## 2023-06-11 NOTE — Progress Notes (Signed)
    NURSE VISIT NOTE  Subjective:    Patient ID: Molly Bray, female    DOB: 10-23-1995, 27 y.o.   MRN: [id number]  HPI  Patient is a 27 y.o. [id number] female who presents for evaluation of amenorrhea. She believes she could be pregnant.  Pregnancy was unexpected  Sexual Activity: single partner, contraception: none. Current symptoms also include: breast tenderness, fatigue, and nausea. Last period was abnormal.    Objective:    BP 112/75   Pulse 99   Wt 155 lb 6.4 oz (70.5 kg)   LMP 04/23/2023 (Exact Date)   BMI 27.53 kg/m   Lab Review  No results found for any visits on 06/11/23.  Assessment:   1. Amenorrhea     Plan:   Pregnancy Test: Positive  Estimated Date of Delivery: 01/28/2024  GA: [redacted]w[redacted]d      Burtis Junes, CMA

## 2023-06-12 ENCOUNTER — Ambulatory Visit: Payer: MEDICAID

## 2023-06-20 ENCOUNTER — Ambulatory Visit: Payer: MEDICAID

## 2023-06-20 VITALS — Wt 140.0 lb

## 2023-06-20 DIAGNOSIS — Z3481 Encounter for supervision of other normal pregnancy, first trimester: Secondary | ICD-10-CM | POA: Diagnosis not present

## 2023-06-20 DIAGNOSIS — F1110 Opioid abuse, uncomplicated: Secondary | ICD-10-CM | POA: Insufficient documentation

## 2023-06-20 DIAGNOSIS — F1120 Opioid dependence, uncomplicated: Secondary | ICD-10-CM

## 2023-06-20 DIAGNOSIS — O3680X1 Pregnancy with inconclusive fetal viability, fetus 1: Secondary | ICD-10-CM

## 2023-06-20 DIAGNOSIS — O0991 Supervision of high risk pregnancy, unspecified, first trimester: Secondary | ICD-10-CM

## 2023-06-20 DIAGNOSIS — Z3A08 8 weeks gestation of pregnancy: Secondary | ICD-10-CM | POA: Diagnosis not present

## 2023-06-20 DIAGNOSIS — Z87440 Personal history of urinary (tract) infections: Secondary | ICD-10-CM

## 2023-06-20 DIAGNOSIS — O0990 Supervision of high risk pregnancy, unspecified, unspecified trimester: Secondary | ICD-10-CM | POA: Insufficient documentation

## 2023-06-20 DIAGNOSIS — R300 Dysuria: Secondary | ICD-10-CM

## 2023-06-20 MED ORDER — NITROFURANTOIN MONOHYD MACRO 100 MG PO CAPS
100.0000 mg | ORAL_CAPSULE | Freq: Two times a day (BID) | ORAL | 0 refills | Status: DC
Start: 2023-06-20 — End: 2023-07-10
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
## 2023-06-20 NOTE — Patient Instructions (Signed)
Commonly Asked Questions During Pregnancy  Cats: A parasite can be excreted in cat feces.  To avoid exposure you need to have another person empty the little box.  If you must empty the litter box you will need to wear gloves.  Wash your hands after handling your cat.  This parasite can also be found in raw or undercooked meat so this should also be avoided.  Colds, Sore Throats, Flu: Please check your medication sheet to see what you can take for symptoms.  If your symptoms are unrelieved by these medications please call the office.  Dental Work: Most any dental work Agricultural consultant recommends is permitted.  X-rays should only be taken during the first trimester if absolutely necessary.  Your abdomen should be shielded with a lead apron during all x-rays.  Please notify your provider prior to receiving any x-rays.  Novocaine is fine; gas is not recommended.  If your dentist requires a note from [place] prior to dental work please call the office and we will provide one for you.  Exercise: Exercise is an important part of staying healthy during your pregnancy.  You may continue most exercises you were accustomed to prior to pregnancy.  Later in your pregnancy you will most likely notice you have difficulty with activities requiring balance like riding a bicycle.  It is important that you listen to your body and avoid activities that put you at a higher risk of falling.  Adequate rest and staying well hydrated are a must!  If you have questions about the safety of specific activities ask your provider.    Exposure to Children with illness: Try to avoid obvious exposure; report any symptoms to [place] when noted,  If you have chicken pos, red measles or mumps, you should be immune to these diseases.   Please do not take any vaccines while pregnant unless you have checked with your OB provider.  Fetal Movement: After 28 weeks we recommend you do "kick counts" twice daily.  Lie or sit down in a calm quiet environment and  count your baby movements "kicks".  You should feel your baby at least 10 times per hour.  If you have not felt 10 kicks within the first hour get up, walk around and have something sweet to eat or drink then repeat for an additional hour.  If count remains less than 10 per hour notify your provider.  Fumigating: Follow your pest control agent's advice as to how long to stay out of your home.  Ventilate the area well before re-entering.  Hemorrhoids:   Most over-the-counter preparations can be used during pregnancy.  Check your medication to see what is safe to use.  It is important to use a stool softener or fiber in your diet and to drink lots of liquids.  If hemorrhoids seem to be getting worse please call the office.   Hot Tubs:  Hot tubs Jacuzzis and saunas are not recommended while pregnant.  These increase your internal body temperature and should be avoided.  Intercourse:  Sexual intercourse is safe during pregnancy as long as you are comfortable, unless otherwise advised by your provider.  Spotting may occur after intercourse; report any bright red bleeding that is heavier than spotting.  Labor:  If you know that you are in labor, please go to the hospital.  If you are unsure, please call the office and let us help you decide what to do.  Lifting, straining, etc:  If your job requires heavy  lifting or straining please check with your provider for any limitations.  Generally, you should not lift items heavier than that you can lift simply with your hands and arms (no back muscles)  Painting:  Paint fumes do not harm your pregnancy, but may make you ill and should be avoided if possible.  Latex or water based paints have less odor than oils.  Use adequate ventilation while painting.  Permanents & Hair Color:  Chemicals in hair dyes are not recommended as they cause increase hair dryness which can increase hair loss during pregnancy.  " Highlighting" and permanents are allowed.  Dye may be  absorbed differently and permanents may not hold as well during pregnancy.  Sunbathing:  Use a sunscreen, as skin burns easily during pregnancy.  Drink plenty of fluids; avoid over heating.  Tanning Beds:  Because their possible side effects are still unknown, tanning beds are not recommended.  Ultrasound Scans:  Routine ultrasounds are performed at approximately 20 weeks.  You will be able to see your baby's general anatomy an if you would like to know the gender this can usually be determined as well.  If it is questionable when you conceived you may also receive an ultrasound early in your pregnancy for dating purposes.  Otherwise ultrasound exams are not routinely performed unless there is a medical necessity.  Although you can request a scan we ask that you pay for it when conducted because insurance does not cover " patient request" scans.  Work: If your pregnancy proceeds without complications you may work until your due date, unless your physician or employer advises otherwise.  Round Ligament Pain/Pelvic Discomfort:  Sharp, shooting pains not associated with bleeding are fairly common, usually occurring in the second trimester of pregnancy.  They tend to be worse when standing up or when you remain standing for long periods of time.  These are the result of pressure of certain pelvic ligaments called "round ligaments".  Rest, Tylenol and heat seem to be the most effective relief.  As the womb and fetus grow, they rise out of the pelvis and the discomfort improves.  Please notify the office if your pain seems different than that described.  It may represent a more serious condition.  First Trimester of Pregnancy  The first trimester of pregnancy starts on the first day of your last menstrual period until the end of week 12. This is also called months 1 through 3 of pregnancy. Body changes during your first trimester Your body goes through many changes during pregnancy. The changes usually  return to normal after your baby is born. Physical changes You may gain or lose weight. Your breasts may grow larger and hurt. The area around your nipples may get darker. Dark spots or blotches may develop on your face. You may have changes in your hair. Health changes You may feel like you might vomit (nauseous), and you may vomit. You may have heartburn. You may have headaches. You may have trouble pooping (constipation). Your gums may bleed. Other changes You may get tired easily. You may pee (urinate) more often. Your menstrual periods will stop. You may not feel hungry. You may want to eat certain kinds of food. You may have changes in your emotions from day to day. You may have more dreams. Follow these instructions at home: Medicines Take over-the-counter and prescription medicines only as told by your doctor. Some medicines are not safe during pregnancy. Take a prenatal vitamin that contains at least 600  micrograms (mcg) of folic acid. Eating and drinking Eat healthy meals that include: Fresh fruits and vegetables. Whole grains. Good sources of protein, such as meat, eggs, or tofu. Low-fat dairy products. Avoid raw meat and unpasteurized juice, milk, and cheese. If you feel like you may vomit, or you vomit: Eat 4 or 5 small meals a day instead of 3 large meals. Try eating a few soda crackers. Drink liquids between meals instead of during meals. You may need to take these actions to prevent or treat trouble pooping: Drink enough fluids to keep your pee (urine) pale yellow. Eat foods that are high in fiber. These include beans, whole grains, and fresh fruits and vegetables. Limit foods that are high in fat and sugar. These include fried or sweet foods. Activity Exercise only as told by your doctor. Most people can do their usual exercise routine during pregnancy. Stop exercising if you have cramps or pain in your lower belly (abdomen) or low back. Do not exercise if  it is too hot or too humid, or if you are in a place of great height (high altitude). Avoid heavy lifting. If you choose to, you may have sex unless your doctor tells you not to. Relieving pain and discomfort Wear a good support bra if your breasts are sore. Rest with your legs raised (elevated) if you have leg cramps or low back pain. If you have bulging veins (varicose veins) in your legs: Wear support hose as told by your doctor. Raise your feet for 15 minutes, 3-4 times a day. Limit salt in your food. Safety Wear your seat belt at all times when you are in a car. Talk with your doctor if someone is hurting you or yelling at you. Talk with your doctor if you are feeling sad or have thoughts of hurting yourself. Lifestyle Do not use hot tubs, steam rooms, or saunas. Do not douche. Do not use tampons or scented sanitary pads. Do not use herbal medicines, illegal drugs, or medicines that are not approved by your doctor. Do not drink alcohol. Do not smoke or use any products that contain nicotine or tobacco. If you need help quitting, ask your doctor. Avoid cat litter boxes and soil that is used by cats. These carry germs that can cause harm to the baby and can cause a loss of your baby by miscarriage or stillbirth. General instructions Keep all follow-up visits. This is important. Ask for help if you need counseling or if you need help with nutrition. Your doctor can give you advice or tell you where to go for help. Visit your dentist. At home, brush your teeth with a soft toothbrush. Floss gently. Write down your questions. Take them to your prenatal visits. Where to find more information American Pregnancy Association: americanpregnancy.org [employer]: www.acog.org Office on Women's Health: Mighty[employer].nz Contact a doctor if: You are dizzy. You have a fever. You have mild cramps or pressure in your lower belly. You have a nagging  pain in your belly area. You continue to feel like you may vomit, you vomit, or you have watery poop (diarrhea) for 24 hours or longer. You have a bad-smelling fluid coming from your vagina. You have pain when you pee. You are exposed to a disease that spreads from person to person, such as chickenpox, measles, Zika virus, HIV, or hepatitis. Get help right away if: You have spotting or bleeding from your vagina. You have very bad belly cramping or pain. You have  shortness of breath or chest pain. You have any kind of injury, such as from a fall or a car crash. You have new or increased pain, swelling, or redness in an arm or leg. Summary The first trimester of pregnancy starts on the first day of your last menstrual period until the end of week 12 (months 1 through 3). Eat 4 or 5 small meals a day instead of 3 large meals. Do not smoke or use any products that contain nicotine or tobacco. If you need help quitting, ask your doctor. Keep all follow-up visits. This information is not intended to replace advice given to you by your health care provider. Make sure you discuss any questions you have with your health care provider. Document Revised: 12/09/2019 Document Reviewed: 10/15/2019 Elsevier Patient Education  2024 Elsevier Inc. Common Medications Safe in Pregnancy  Acne:      Constipation:  Benzoyl Peroxide     Colace  Clindamycin      Dulcolax Suppository  Topica Erythromycin     Fibercon  Salicylic Acid      Metamucil         Miralax AVOID:        Senakot   Accutane    Cough:  Retin-A       Cough Drops  Tetracycline      Phenergan w/ Codeine if Rx  Minocycline      Robitussin (Plain & DM)  Antibiotics:     Crabs/Lice:  Ceclor       RID  Cephalosporins    AVOID:  E-Mycins      Kwell  Keflex  Macrobid/Macrodantin   Diarrhea:  Penicillin      Kao-Pectate  Zithromax      Imodium AD         PUSH FLUIDS AVOID:       Cipro     Fever:  Tetracycline      Tylenol (Regular or  Extra  Minocycline       Strength)  Levaquin      Extra Strength-Do not          Exceed 8 tabs/24 hrs Caffeine:        200mg /day (equiv. To 1 cup of coffee or  approx. 3 12 oz sodas)         Gas: Cold/Hayfever:       Gas-X  Benadryl      Mylicon  Claritin       Phazyme  **Claritin-D        Chlor-Trimeton    Headaches:  Dimetapp      ASA-Free Excedrin  Drixoral-Non-Drowsy     Cold Compress  Mucinex (Guaifenasin)     Tylenol (Regular or Extra  Sudafed/Sudafed-12 Hour     Strength)  **Sudafed PE Pseudoephedrine   Tylenol Cold & Sinus     Vicks Vapor Rub  Zyrtec  **AVOID if Problems With Blood Pressure         Heartburn: Avoid lying down for at least 1 hour after meals  Aciphex      Maalox     Rash:  Milk of Magnesia     Benadryl    Mylanta       1% Hydrocortisone Cream  Pepcid  Pepcid Complete   Sleep Aids:  Prevacid      Ambien   Prilosec       Benadryl  Rolaids       Chamomile Tea  Tums (Limit 4/day)     Unisom  Tylenol PM         Warm milk-add vanilla or  Hemorrhoids:       Sugar for taste  Anusol/Anusol H.C.  (RX: Analapram 2.5%)  Sugar Substitutes:  Hydrocortisone OTC     Ok in moderation  Preparation H      Tucks        Vaseline lotion applied to tissue with wiping    Herpes:     Throat:  Acyclovir      Oragel  Famvir  Valtrex     Vaccines:         Flu Shot Leg Cramps:       *Gardasil  Benadryl      Hepatitis A         Hepatitis B Nasal Spray:       Pneumovax  Saline Nasal Spray     Polio Booster         Tetanus Nausea:       Tuberculosis test or PPD  Vitamin B6 25 mg TID   AVOID:    Dramamine      *Gardasil  Emetrol       Live Poliovirus  Ginger Root 250 mg QID    MMR (measles, mumps &  High Complex Carbs @ Bedtime    rebella)  Sea Bands-Accupressure    Varicella (Chickenpox)  Unisom 1/2 tab TID     *No known complications           If received before Pain:         Known pregnancy;   Darvocet       Resume series  after  Lortab        Delivery  Percocet    Yeast:   Tramadol      Femstat  Tylenol 3      Gyne-lotrimin  Ultram       Monistat  Vicodin           MISC:         All Sunscreens           Hair Coloring/highlights          Insect Repellant's          (Including DEET)         Mystic Tans

## 2023-06-21 ENCOUNTER — Telehealth: Payer: Self-pay

## 2023-06-21 NOTE — Telephone Encounter (Signed)
-----   Message from Victoria sent at 06/20/2023  9:10 PM EST ----- Regarding: RE: nob intake Unfortunately none of [place] in the office prescribe this medication. She can go as a walk-in any day of the week from 8-4 pm that would be convenient for her transportation needs when she has them.  Also have Angie reach out to her so that she can begin working on her transportation needs and can probably get her to where she needs to go to get treatment early. ----- Message ----- From: Loman Chroman, CMA Sent: 06/20/2023   3:49 PM EST To: Hildred Laser, MD Subject: nob intake                                     I just did a NOB intake with this patient. She is a current fentanyl user of 3 years and wants to talk about Subutex or Suboxone. She states she wants to stop using and that she can go to [facility] but she needs medication beforehand. She has little to no transportation, was going to go to the hospital but was unable. Please advise

## 2023-06-21 NOTE — Telephone Encounter (Signed)
Spoke with patient after Dr. Oretha Milch response. She was understanding of the walk in option from 8-4. No additional questions.

## 2023-07-04 ENCOUNTER — Other Ambulatory Visit: Payer: Self-pay | Admitting: Obstetrics and Gynecology

## 2023-07-04 ENCOUNTER — Ambulatory Visit: Payer: MEDICAID

## 2023-07-04 ENCOUNTER — Ambulatory Visit: Payer: MEDICAID | Admitting: Certified Nurse Midwife

## 2023-07-04 VITALS — BP 90/56 | HR 87 | Wt 149.3 lb

## 2023-07-04 DIAGNOSIS — O3680X1 Pregnancy with inconclusive fetal viability, fetus 1: Secondary | ICD-10-CM

## 2023-07-04 DIAGNOSIS — Z3A09 9 weeks gestation of pregnancy: Secondary | ICD-10-CM

## 2023-07-04 DIAGNOSIS — F1110 Opioid abuse, uncomplicated: Secondary | ICD-10-CM

## 2023-07-04 DIAGNOSIS — F1120 Opioid dependence, uncomplicated: Secondary | ICD-10-CM

## 2023-07-04 DIAGNOSIS — H9202 Otalgia, left ear: Secondary | ICD-10-CM | POA: Diagnosis not present

## 2023-07-04 DIAGNOSIS — Z87440 Personal history of urinary (tract) infections: Secondary | ICD-10-CM

## 2023-07-04 DIAGNOSIS — O0991 Supervision of high risk pregnancy, unspecified, first trimester: Secondary | ICD-10-CM

## 2023-07-04 DIAGNOSIS — O3680X0 Pregnancy with inconclusive fetal viability, not applicable or unspecified: Secondary | ICD-10-CM | POA: Diagnosis not present

## 2023-07-04 DIAGNOSIS — R300 Dysuria: Secondary | ICD-10-CM

## 2023-07-04 DIAGNOSIS — O0990 Supervision of high risk pregnancy, unspecified, unspecified trimester: Secondary | ICD-10-CM

## 2023-07-04 DIAGNOSIS — Z3A08 8 weeks gestation of pregnancy: Secondary | ICD-10-CM

## 2023-07-04 DIAGNOSIS — R42 Dizziness and giddiness: Secondary | ICD-10-CM
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
## 2023-07-10 ENCOUNTER — Emergency Department
Admission: EM | Admit: 2023-07-10 | Discharge: 2023-07-10 | Disposition: A | Discharge status: Home / Self Care | Payer: MEDICAID | Attending: Emergency Medicine | Admitting: Emergency Medicine

## 2023-07-10 ENCOUNTER — Other Ambulatory Visit: Payer: Self-pay

## 2023-07-10 DIAGNOSIS — Z1152 Encounter for screening for COVID-19: Secondary | ICD-10-CM | POA: Insufficient documentation

## 2023-07-10 DIAGNOSIS — J45909 Unspecified asthma, uncomplicated: Secondary | ICD-10-CM | POA: Diagnosis not present

## 2023-07-10 DIAGNOSIS — F1120 Opioid dependence, uncomplicated: Secondary | ICD-10-CM | POA: Diagnosis not present

## 2023-07-10 DIAGNOSIS — J069 Acute upper respiratory infection, unspecified: Secondary | ICD-10-CM | POA: Diagnosis not present

## 2023-07-10 DIAGNOSIS — R059 Cough, unspecified: Secondary | ICD-10-CM | POA: Diagnosis present

## 2023-07-10 LAB — RESP PANEL BY RT-PCR (RSV, FLU A&B, COVID)  RVPGX2
Influenza A by PCR: NEGATIVE
Influenza B by PCR: NEGATIVE
Resp Syncytial Virus by PCR: NEGATIVE
SARS Coronavirus 2 by RT PCR: NEGATIVE

## 2023-07-10 LAB — URINALYSIS, ROUTINE W REFLEX MICROSCOPIC
Bilirubin Urine: NEGATIVE
Glucose, UA: NEGATIVE mg/dL
Hgb urine dipstick: NEGATIVE
Ketones, ur: 5 mg/dL — AB
Leukocytes,Ua: NEGATIVE
Nitrite: NEGATIVE
Protein, ur: 30 mg/dL — AB
Specific Gravity, Urine: 1.023 (ref 1.005–1.030)
pH: 5 (ref 5.0–8.0)

## 2023-07-10 LAB — BASIC METABOLIC PANEL
Anion gap: 11 (ref 5–15)
BUN: 8 mg/dL (ref 6–20)
CO2: 22 mmol/L (ref 22–32)
Calcium: 9 mg/dL (ref 8.9–10.3)
Chloride: 101 mmol/L (ref 98–111)
Creatinine, Ser: 0.61 mg/dL (ref 0.44–1.00)
GFR, Estimated: >60 mL/min (ref >60)
Glucose, Bld: 154 mg/dL — ABNORMAL HIGH (ref 70–99)
Potassium: 3.4 mmol/L — ABNORMAL LOW (ref 3.5–5.1)
Sodium: 134 mmol/L — ABNORMAL LOW (ref 135–145)

## 2023-07-10 LAB — CBC WITH DIFFERENTIAL/PLATELET
Abs Immature Granulocytes: 0.04 10*3/uL (ref 0.00–0.07)
Basophils Absolute: 0.1 10*3/uL (ref 0.0–0.1)
Basophils Relative: 1 %
Eosinophils Absolute: 0.1 10*3/uL (ref 0.0–0.5)
Eosinophils Relative: 1 %
HCT: 43.5 % (ref 36.0–46.0)
Hemoglobin: 15 g/dL (ref 12.0–15.0)
Immature Granulocytes: 0 %
Lymphocytes Relative: 22 %
Lymphs Abs: 2.6 10*3/uL (ref 0.7–4.0)
MCH: 31.1 pg (ref 26.0–34.0)
MCHC: 34.5 g/dL (ref 30.0–36.0)
MCV: 90.1 fL (ref 80.0–100.0)
Monocytes Absolute: 0.6 10*3/uL (ref 0.1–1.0)
Monocytes Relative: 5 %
Neutro Abs: 8.1 10*3/uL — ABNORMAL HIGH (ref 1.7–7.7)
Neutrophils Relative %: 71 %
Platelets: 353 10*3/uL (ref 150–400)
RBC: 4.83 MIL/uL (ref 3.87–5.11)
RDW: 12.1 % (ref 11.5–15.5)
WBC: 11.4 10*3/uL — ABNORMAL HIGH (ref 4.0–10.5)
nRBC: 0 % (ref 0.0–0.2)

## 2023-07-10 LAB — TROPONIN I (HIGH SENSITIVITY): Troponin I (High Sensitivity): 2 ng/L (ref <18)

## 2023-07-10 LAB — HCG, QUANTITATIVE, PREGNANCY: hCG, Beta Chain, Quant, S: 104322 m[IU]/mL — ABNORMAL HIGH (ref <5)

## 2023-07-10 MED ORDER — ONDANSETRON 4 MG PO TBDP: 4.0000 mg | ORAL_TABLET | Freq: Three times a day (TID) | ORAL | 0 refills | Status: DC | PRN

## 2023-07-10 MED ORDER — AZITHROMYCIN 250 MG PO TABS: 250.0000 mg | ORAL_TABLET | Freq: Every day | ORAL | 0 refills | Status: AC

## 2023-07-10 MED ORDER — BUPRENORPHINE HCL-NALOXONE HCL 8-2 MG SL SUBL: 1.0000 | SUBLINGUAL_TABLET | Freq: Two times a day (BID) | SUBLINGUAL | 0 refills | Status: DC

## 2023-07-10 MED ORDER — CEPHALEXIN 500 MG PO CAPS: 500.0000 mg | ORAL_CAPSULE | Freq: Three times a day (TID) | ORAL | 0 refills | Status: AC

## 2023-07-10 MED ORDER — BUPRENORPHINE HCL-NALOXONE HCL 8-2 MG SL SUBL
1.0000 | SUBLINGUAL_TABLET | Freq: Once | SUBLINGUAL | Status: AC
  Administered 2023-07-10: 1 via SUBLINGUAL
  Filled 2023-07-10: qty 1

## 2023-07-10 MED ORDER — AZITHROMYCIN 500 MG PO TABS
500.0000 mg | ORAL_TABLET | Freq: Once | ORAL | Status: AC
  Administered 2023-07-10: 500 mg via ORAL
  Filled 2023-07-10: qty 1

## 2023-07-10 MED ORDER — CEPHALEXIN 500 MG PO CAPS
500.0000 mg | ORAL_CAPSULE | Freq: Once | ORAL | Status: AC
  Administered 2023-07-10: 500 mg via ORAL
  Filled 2023-07-10: qty 1

## 2023-07-10 NOTE — ED Triage Notes (Addendum)
Pt sts that she is a fentanyl addict and that she is clean and has been having left sided ear pain as well as having low BP and having weakness and chest pain as well as having a high fever and having feelings of passing out. Pt is having many rambling thoughts while talking. Pt also sts that she is [redacted]wks pregnant at this time also.

## 2023-07-10 NOTE — ED Provider Notes (Signed)
[facility] Provider Note     Event Date/Time   First MD Initiated Contact with Patient 07/10/23 1542     (approximate)   History   Multiple Medical Complaints  HPI  Molly Bray is a 27 y.o. female G6P3 with a history of anxiety, GERD, PTSD, asthma, and substance use disorder currently under treatment, presents to the ED for evaluation of left ear pain as well as some low blood pressure readings.  Patient would also endorse generalized weakness, some chest discomfort, productive cough, and subjective fevers.  Patient was discharged from her inpatient rehab facility because of some hypotension during treatment.  She is under the care of [facility] for this current pregnancy.  Patient is requesting a refill of her Suboxone as she has not been able to establish care with a provider since being discharged 2 days ago from the inpatient facility.  She also wonders if her symptoms could be due to her opioid withdrawal.  Patient denies any pregnancy related complaints including vaginal bleeding or discharge.  She would report that she was diagnosed with bacteriuria about 3 weeks ago at a previous visit, but has been unable to pick up the prescription.  Physical Exam   Triage Vital Signs: ED Triage Vitals  Encounter Vitals Group     BP 07/10/23 1538 123/78     Systolic BP Percentile --      Diastolic BP Percentile --      Pulse Rate 07/10/23 1538 (!) 122     Resp 07/10/23 1538 19     Temp 07/10/23 1538 98.6 F (37 C)     Temp Source 07/10/23 1538 Oral     SpO2 07/10/23 1538 100 %     Weight 07/10/23 1539 143 lb (64.9 kg)     Height 07/10/23 1539 5\' 6"  (1.676 m)     Head Circumference --      Peak Flow --      Pain Score 07/10/23 1539 0     Pain Loc --      Pain Education --      Exclude from Growth Chart --     Most recent vital signs: Vitals:   07/10/23 1700 07/10/23 1832  BP: 111/86 112/78  Pulse: (!) 110 99  Resp:  16   Temp:  98.4 F (36.9 C)  SpO2: 100% 100%    General Awake, no distress. NAD HEENT NCAT. PERRL. EOMI. No rhinorrhea. Mucous membranes are moist.  Left ear and TM without evidence of otitis externa or AOM.  The inferior canal does appear to have a small inclusion cyst which is consistent with the patient's report of pain. CV:  Good peripheral perfusion. RRR RESP:  Normal effort. CTA ABD:  No distention.    ED Results / Procedures / Treatments   Labs (all labs ordered are listed, but only abnormal results are displayed) Labs Reviewed  URINALYSIS, ROUTINE W REFLEX MICROSCOPIC - Abnormal; Notable for the following components:      Result Value   Color, Urine AMBER (*)    APPearance CLOUDY (*)    Ketones, ur 5 (*)    Protein, ur 30 (*)    Bacteria, UA RARE (*)    All other components within normal limits  BASIC METABOLIC PANEL - Abnormal; Notable for the following components:   Sodium 134 (*)    Potassium 3.4 (*)    Glucose, Bld 154 (*)    All other components within  normal limits  CBC WITH DIFFERENTIAL/PLATELET - Abnormal; Notable for the following components:   WBC 11.4 (*)    Neutro Abs 8.1 (*)    All other components within normal limits  HCG, QUANTITATIVE, PREGNANCY - Abnormal; Notable for the following components:   hCG, Beta Chain, Quant, S [id number] (*)    All other components within normal limits  RESP PANEL BY RT-PCR (RSV, FLU A&B, COVID)  [id number]  TROPONIN I (HIGH SENSITIVITY)     EKG  Vent. rate 108 BPM PR interval 146 ms QRS duration 88 ms QT/QTcB 316/423 ms P-R-T axes 77 92 71 Sinus tachycardia Right atrial enlargement Rightward axis Borderline ECG When compared with ECG of 02-Jan-2023 16:03, No significant change was found No STEMI  RADIOLOGY  No results found.   PROCEDURES:  Critical Care performed: No  Procedures   MEDICATIONS ORDERED IN ED: Medications  azithromycin (ZITHROMAX) tablet 500 mg (500 mg Oral Given 07/10/23 1750)   cephALEXin (KEFLEX) capsule 500 mg (500 mg Oral Given 07/10/23 1750)  buprenorphine-naloxone (SUBOXONE) 8-2 mg per SL tablet 1 tablet (1 tablet Sublingual Given 07/10/23 1821)     IMPRESSION / MDM / ASSESSMENT AND PLAN / ED COURSE  I reviewed the triage vital signs and the nursing notes.                              Differential diagnosis includes, but is not limited to, COVID, flu, RSV, viral URI, bronchitis, opioid dependence, asymptomatic bacteriuria  Patient's presentation is most consistent with acute complicated illness / injury requiring diagnostic workup.  Patient's diagnosis is consistent with patient to the ED for evaluation of symptoms started for possible viral URI.  She does have symptoms consistent with a viral URI with cough.  Viral panel test is negative however patient with symptoms consistent with likely bronchitis.  She also has opioid use disorder, and requesting refill of her Suboxone.  No acute distress on exam no evidence of acute withdrawal on presentation.  Patient vital signs and courses are stable during the ED.  She will be treated empirically for bacteriuria noted on her most recent UA at pregnancy confirmation.  Azithromycin is provided for her upper respiratory infection.  Patient will be discharged home with prescriptions for Suboxone. Patient is to follow up with her OB provider as well as her intended substance abuse counselor, as discussed, as needed or otherwise directed. Patient is given ED precautions to return to the ED for any worsening or new symptoms.  Clinical Course as of 07/10/23 1916  Wed Jul 10, 2023  1740 HCG, Newman Nickels(!): [phone] [JM]    Clinical Course User Index [JM] Demaree Liberto, Charlesetta Ivory, PA-C    FINAL CLINICAL IMPRESSION(S) / ED DIAGNOSES   Final diagnoses:  Viral URI with cough  Uncomplicated opioid dependence (HCC)     Rx / DC Orders   ED Discharge Orders          Ordered    azithromycin (ZITHROMAX Z-PAK) 250  MG tablet  Daily        07/10/23 1759    buprenorphine-naloxone (SUBOXONE) 8-2 mg SUBL SL tablet  2 times daily        07/10/23 1759    ondansetron (ZOFRAN-ODT) 4 MG disintegrating tablet  Every 8 hours PRN        07/10/23 1759    cephALEXin (KEFLEX) 500 MG capsule  3 times daily  07/10/23 1759             Note:  This document was prepared using Dragon voice recognition software and may include unintentional dictation errors.    Lissa Hoard, PA-C 07/11/23 2212    Trinna Post, MD 07/11/23 2322

## 2023-07-10 NOTE — Discharge Instructions (Signed)
Your exam, labs, and EKG are normal and reassuring at this time.  You are being treated for a upper respiratory infection asymptomatic bacteriuria in pregnancy.  Take the prescription antibiotics as directed until all pills are gone.  Take your Suboxone twice daily as prescribed.  Follow-up with your intended psychotherapist or counselor for ongoing evaluation management of your symptoms.

## 2023-07-12 ENCOUNTER — Encounter: Payer: MEDICAID | Admitting: Obstetrics and Gynecology

## 2023-07-12 ENCOUNTER — Ambulatory Visit: Payer: MEDICAID

## 2023-07-12 VITALS — BP 95/60 | HR 103 | Ht 62.0 in | Wt 146.0 lb

## 2023-07-12 DIAGNOSIS — O2651 Maternal hypotension syndrome, first trimester: Secondary | ICD-10-CM | POA: Insufficient documentation

## 2023-07-12 DIAGNOSIS — G4700 Insomnia, unspecified: Secondary | ICD-10-CM | POA: Insufficient documentation

## 2023-07-12 MED ORDER — HYDROXYZINE HCL 25 MG PO TABS: ORAL_TABLET | ORAL | 3 refills | Status: DC

## 2023-07-12 NOTE — Progress Notes (Signed)
    NURSE VISIT NOTE  Subjective:    Patient ID: Molly Bray, female    DOB: [birth year], 28 y.o.   MRN: [id number]  HPI  Patient is a 27 y.o. [id number] female who presents for BP check. She states she continues to feel like she is going to pass out when she stands up. Her chest feels heavy and her heart races. She also reports insomnia due to transition from fentanyl to suboxone and requests a prescription for this.  Patient reports compliance with prescribed BP medications: N/A Last dose of BP medication: N/A  BP Readings from Last 3 Encounters:  07/12/23 95/60  07/10/23 112/78  07/04/23 (!) 90/56   Pulse Readings from Last 3 Encounters:  07/12/23 (!) 103  07/10/23 99  07/04/23 87    Objective:    BP 95/60   Pulse (!) 103   Ht 5\' 2"  (1.575 m)   Wt 146 lb (66.2 kg)   LMP 04/23/2023 (Exact Date)   BMI 26.70 kg/m   Assessment:   1. Insomnia, unspecified type   2. Maternal hypotension syndrome in first trimester      Plan:   Per Dr. Hildred Laser, MD:  Patient advised on proper hydration with water, gatorade, body armour, electrolytes and to eat plenty of high sodium snacks. Prescription for Atarax sent for insomnia.  Patient unhappy that she is not receiving a prescription to assist with hypotension and feeling like she is going to pass out. She requests this be noted in the event she does pass out and is injured. Return to clinic as scheduled. Patient states she may schedule appointment for prenatal care elsewhere.  Patient verbalized understanding of instructions.   Rocco Serene, LPN

## 2023-07-17 NOTE — L&D Delivery Note (Signed)
 Obstetrical Delivery Note   Date of Delivery:   01/25/2024 Primary OB:   AOB  Gestational Age/EDD: [redacted]w[redacted]d (Dated by LMP) Reason for Admission: Contractions  Antepartum complications: hypotension, hx anxiety, hx of Fentanyl  and heroin use  Delivered By:   LITTIE Cookey, CNM   Delivery Type:   spontaneous vaginal delivery  Delivery Details:   Contractions started 20 minutes prior to arriving to the hospital. As EMS was transporting her to the elevator she reported a strong urge to push. The fetal head was visible on the perineum. She was instructed to push. She had a SVB of viable female at 31. Infant birthed OA to ROA followed by easy shoulders. Infant placed on mother's abdomen, vigorous cry with stimulation, HR >100.  The pt was then transported to [facility].  Placenta delivered spontaneously. Cord then doubly clamped and cut by this CNM. Infant taken to warmer for assessment while mother was transferred to the bed. 10 units IM Pitocin  given. On inspection of the perineum a first degree laceration was found but hemostatic, therefore not repaired. Infant placed skin to skin with mother, both stable.  Anesthesia:    none Intrapartum complications: precipitous birth  GBS:    Negative Laceration:    1st degree Episiotomy:    none Rectal exam:   Deferred  Placenta:    Delivered and expressed via active management. Intact: yes. To pathology: no.  Delayed Cord Clamping: yes Estimated Blood Loss:   Baby:    Liveborn female, APGARs 8/9, weight 3610gm  Jinnie Cookey, CNM  [facility] 01/25/2024 6:22 AM

## 2023-07-25 ENCOUNTER — Encounter: Payer: MEDICAID | Admitting: Advanced Practice Midwife

## 2023-07-25 ENCOUNTER — Telehealth: Payer: Self-pay | Admitting: Advanced Practice Midwife

## 2023-07-25 NOTE — Telephone Encounter (Signed)
 Reached out to pt to reschedule NOB appt that was scheduled on 07/25/2023 with JEG at 9:55.  Could not leave a message bc call could not be completed.

## 2023-07-26 ENCOUNTER — Encounter: Payer: Self-pay | Admitting: Advanced Practice Midwife

## 2023-07-26 NOTE — Telephone Encounter (Signed)
 Reached out to pt (2x) to reschedule NOB appt that was scheduled on 1/9/025 with JEG at 9:55.  Could not leave a message bc call could not be completed.  Will send a MyChart letter to pt.

## 2023-08-15 ENCOUNTER — Telehealth: Payer: Self-pay | Admitting: Obstetrics

## 2023-08-15 ENCOUNTER — Encounter: Payer: MEDICAID | Admitting: Obstetrics

## 2023-08-15 NOTE — Telephone Encounter (Signed)
The patient is scheduled for 1:15 on 2/3. The patient is aware to arrive at 1:00 pm for check in.

## 2023-08-15 NOTE — Telephone Encounter (Signed)
Reached out to pt to reschedule NOB appt that was scheduled on 08/14/2022 at 2:35 with [place] Livings.  Left message for pt to call back to reschedule.

## 2023-08-16 NOTE — Telephone Encounter (Signed)
Pt is scheduled on 08/19/2023 at 1:15 with JEG for NOB appt.

## 2023-08-19 ENCOUNTER — Telehealth: Payer: Self-pay | Admitting: Advanced Practice Midwife

## 2023-08-19 ENCOUNTER — Encounter: Payer: MEDICAID | Admitting: Advanced Practice Midwife

## 2023-08-19 NOTE — Telephone Encounter (Signed)
Reached out to pt to reschedule NOB appt that was scheduled on 08/19/2023 at 1"15 with JEG.  Left message for pt to call back.

## 2023-08-20 ENCOUNTER — Encounter: Payer: Self-pay | Admitting: Advanced Practice Midwife

## 2023-08-20 NOTE — Telephone Encounter (Signed)
Reached out to pt (2x) to reschedule NOB appt that was scheduled on 08/19/2023 at 1:15 with JEG.  Left message for pt to call back.  Will send a MyChart letter.

## 2023-08-28 ENCOUNTER — Ambulatory Visit (INDEPENDENT_AMBULATORY_CARE_PROVIDER_SITE_OTHER): Payer: MEDICAID

## 2023-08-28 ENCOUNTER — Other Ambulatory Visit (HOSPITAL_COMMUNITY)
Admission: RE | Admit: 2023-08-28 | Discharge: 2023-08-28 | Disposition: A | Discharge status: Home / Self Care | Payer: MEDICAID | Source: Ambulatory Visit

## 2023-08-28 VITALS — BP 104/71 | HR 123 | Wt 140.0 lb

## 2023-08-28 DIAGNOSIS — Z3A18 18 weeks gestation of pregnancy: Secondary | ICD-10-CM

## 2023-08-28 DIAGNOSIS — Z113 Encounter for screening for infections with a predominantly sexual mode of transmission: Secondary | ICD-10-CM

## 2023-08-28 DIAGNOSIS — Z0184 Encounter for antibody response examination: Secondary | ICD-10-CM

## 2023-08-28 DIAGNOSIS — Z1379 Encounter for other screening for genetic and chromosomal anomalies: Secondary | ICD-10-CM

## 2023-08-28 DIAGNOSIS — D649 Anemia, unspecified: Secondary | ICD-10-CM | POA: Insufficient documentation

## 2023-08-28 DIAGNOSIS — Z3482 Encounter for supervision of other normal pregnancy, second trimester: Secondary | ICD-10-CM

## 2023-08-28 DIAGNOSIS — Z131 Encounter for screening for diabetes mellitus: Secondary | ICD-10-CM | POA: Diagnosis present

## 2023-08-28 DIAGNOSIS — Z13220 Encounter for screening for lipoid disorders: Secondary | ICD-10-CM | POA: Insufficient documentation

## 2023-08-28 DIAGNOSIS — F1120 Opioid dependence, uncomplicated: Secondary | ICD-10-CM

## 2023-08-28 DIAGNOSIS — Z0283 Encounter for blood-alcohol and blood-drug test: Secondary | ICD-10-CM | POA: Insufficient documentation

## 2023-08-28 NOTE — Progress Notes (Addendum)
NEW OB HISTORY AND PHYSICAL  SUBJECTIVE:       Molly Bray is a 28 y.o. [phone] female, Patient's last menstrual period was 04/23/2023 (exact date)., Estimated Date of Delivery: 01/28/24, [redacted]w[redacted]d, presents today for establishment of Prenatal Care. She reports low menstrual cramping, lightheadedness and hot flashes.  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 Last use was 07/05/2023. Went to detox after and had been on Suboxone since early Feb. Unable to get a provider to manage her medication so she stopped using it. Feels as though she is managing her urges well at this point.  Partner/Relationship: FOB involved and feels like a safe relationship Living situation: alone with stable housing, access to food but poor transportation.  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 Normal Last Pap: 2021. Results were: normal  Other Pertinent Health History:   Obstetric History OB History  Gravida Para Term Preterm AB Living  6 3 2 1 2 3   SAB IAB Ectopic Multiple Live Births  2   0 3    # Outcome Date GA Lbr Len/2nd Weight Sex Type Anes PTL Lv  6 Current           5 Term 07/14/19 [redacted]w[redacted]d / 00:08 7 lb 15 oz (3.6 kg) M Vag-Spont EPI  LIV  4 Term 08/12/17 [redacted]w[redacted]d 04:00 / 00:42 7 lb 8.6 oz (3.42 kg) M Vag-Spont EPI  LIV  3 Preterm 01/06/13 [redacted]w[redacted]d  5 lb 4 oz (2.381 kg) M Vag-Spont  Y LIV  2 SAB           1 SAB             Past Medical History:  Diagnosis Date   ADHD    Anxiety    Asthma    Chronic kidney disease    KIDNEY REFLUX   Depression    GERD (gastroesophageal reflux disease)    WITH PREGNANCY ONLY   Headache    MIGRAINES   History of methicillin resistant staphylococcus aureus (MRSA) 2008   IBS (irritable bowel syndrome)    constipation   Pinched nerve    PTSD (post-traumatic stress disorder)    Pyelonephritis    Recurrent UTI      Past Surgical History:  Procedure Laterality Date   CHOLECYSTECTOMY N/A 03/07/2018   Procedure: LAPAROSCOPIC CHOLECYSTECTOMY;  Surgeon: Sung Amabile, DO;  Location: [facility] ORS;  Service: General;  Laterality: N/A;   galbladder removed  2019   KIDNEY SURGERY      Current Outpatient Medications on File Prior to Visit  Medication Sig Dispense Refill   Prenatal Vit-Fe Fumarate-FA (MULTIVITAMIN-PRENATAL) 27-0.8 MG TABS tablet Take 1 tablet by mouth daily at 12 noon.     buprenorphine-naloxone (SUBOXONE) 8-2 mg SUBL SL tablet Place 1 tablet under the tongue 2 (two) times daily. (Patient not taking: Reported on 08/28/2023) 10 tablet 0   hydrOXYzine (ATARAX) 25 MG tablet 1-2 tabs po at bedtime prn (Patient not taking: Reported on 08/28/2023) 60 tablet 3   naproxen (NAPROSYN) 500 MG tablet Take 1 tablet (500 mg total) by mouth 2 (two) times daily with a meal. (Patient not taking: Reported on 06/11/2023) 20 tablet 0   ondansetron (ZOFRAN-ODT) 4 MG disintegrating tablet Take 1 tablet (4 mg total) by mouth every 8 (eight) hours as needed for nausea or vomiting. (Patient not taking: Reported on 08/28/2023) 15 tablet  0   No current facility-administered medications on file prior to visit.     Social History   Socioeconomic History   Marital status: Legally Separated    Spouse name: Not on file   Number of children: Not on file   Years of education: Not on file   Highest education level: Not on file  Occupational History   Not on file  Tobacco Use   Smoking status: Every Day    Current packs/day: 1.00    Average packs/day: 1 pack/day for 8.0 years (8.0 ttl pk-yrs)    Types: Cigarettes, E-cigarettes   Smokeless tobacco: Never  Vaping Use   Vaping status: Some Days  Substance and Sexual Activity   Alcohol use: Not Currently    Comment: last use age 89 per pt   Drug use: Yes    Types: Heroin, Fentanyl    Comment: last use 02/24/23 snorted   Sexual activity: Yes    Partners: Male    Birth  control/protection: None    Comment: planning tubal  Other Topics Concern   Not on file  Social History Narrative   Not on file   Social Drivers of Health   Financial Resource Strain: Low Risk  (04/23/2019)   Overall Financial Resource Strain (CARDIA)    Difficulty of Paying Living Expenses: Not hard at all  Food Insecurity: No Food Insecurity (04/23/2019)   Hunger Vital Sign    Worried About Running Out of Food in the Last Year: Never true    Ran Out of Food in the Last Year: Never true  Transportation Needs: No Transportation Needs (04/23/2019)   PRAPARE - Administrator, Civil Service (Medical): No    Lack of Transportation (Non-Medical): No  Physical Activity: Unknown (04/23/2019)   Exercise Vital Sign    Days of Exercise per Week: Patient declined    Minutes of Exercise per Session: Patient declined  Stress: Stress Concern Present (04/23/2019)   [employer] of Occupational Health - Occupational Stress Questionnaire    Feeling of Stress : Very much  Social Connections: Moderately Isolated (04/23/2019)   Social Connection and Isolation Panel [NHANES]    Frequency of Communication with Friends and Family: More than three times a week    Frequency of Social Gatherings with Friends and Family: Once a week    Attends Religious Services: More than 4 times per year    Active Member of [employer] or Organizations: No    Attends Banker Meetings: Never    Marital Status: Separated  Intimate Partner Violence: Not At Risk (04/23/2019)   Humiliation, Afraid, Rape, and Kick questionnaire    Fear of Current or Ex-Partner: No    Emotionally Abused: No    Physically Abused: No    Sexually Abused: No    Family History  Problem Relation Age of Onset   COPD Mother    Diabetes Mother    ADD / ADHD Mother    ADD / ADHD Sister    Learning disabilities Sister    Diabetes Maternal Aunt    COPD Father    Diabetes Father    Hypertension Neg Hx    Ovarian cancer Neg  Hx    Breast cancer Neg Hx    Heart disease Neg Hx     The following portions of the patient's history were reviewed and updated as appropriate: allergies, current medications, past OB history, past medical history, past surgical history, past family history, past social history, and problem  list.  Constitutional: Denied constitutional symptoms, night sweats, recent illness, fatigue, fever, insomnia and weight loss.  Eyes: Denied eye symptoms, eye pain, photophobia, vision change and visual disturbance.  Ears/Nose/Throat/Neck: Denied ear, nose, throat or neck symptoms, hearing loss, nasal discharge, sinus congestion and sore throat.  Cardiovascular: Denied cardiovascular symptoms, arrhythmia, chest pain/pressure, edema, exercise intolerance, orthopnea and palpitations.  Respiratory: Denied pulmonary symptoms, asthma, pleuritic pain, productive sputum, cough, dyspnea and wheezing.  Gastrointestinal: Denied, gastro-esophageal reflux, melena, nausea and vomiting.  Genitourinary: Denied genitourinary symptoms including symptomatic vaginal discharge, pelvic relaxation issues. +pelvic pain, +cramping  Musculoskeletal: Denied musculoskeletal symptoms, stiffness, swelling, muscle weakness and myalgia.  Dermatologic: Denied dermatology symptoms, rash and scar.  Neurologic: Denied neurology symptoms, dizziness, headache, neck pain and syncope.  Psychiatric: Denied psychiatric symptoms, anxiety and depression.  Endocrine: Denied endocrine symptoms including hot flashes and night sweats.     OBJECTIVE: Initial Physical Exam (New OB)  GENERAL APPEARANCE: alert, well appearing HEAD: normocephalic, atraumatic THYROID: no thyromegaly or masses present BREASTS: not examined LUNGS: clear to auscultation, no wheezes, rales or rhonchi, symmetric air entry HEART: regular rate and rhythm, no murmurs ABDOMEN: soft, nontender, nondistended, no abnormal masses, no epigastric pain, +umbilical hernia,  gravid EXTREMITIES: no redness or tenderness in the calves or thighs SKIN: normal coloration and turgor, no rashes LYMPH NODES: no adenopathy palpable NEUROLOGIC: alert, oriented, normal speech, no focal findings or movement disorder noted  PELVIC EXAM ADNEXA: no masses palpable and nontender  ASSESSMENT/PLAN  High Risk Pregnancy  Patient does not meet criteria for low dose ASA therapy.     Routine prenatal care. We discussed an overview of prenatal care and when to call. Reviewed diet, exercise, and weight gain recommendations in pregnancy.  I reviewed labs and answered all questions.  Anatomy ultrasound ordered for 18-20 weeks.  NOB labs: ordered  Will message Jola Schmidt, CSW to contact patient to assess needs.   Anatomy US ordered  See orders  Oley Balm, CNM 08/28/23 3:16 PM

## 2023-08-29 LAB — HEMOGLOBIN A1C
Est. average glucose Bld gHb Est-mCnc: 103 mg/dL
Hgb A1c MFr Bld: 5.2 % (ref 4.8–5.6)

## 2023-08-29 LAB — CBC/D/PLT+RPR+RH+ABO+RUBIGG...
Antibody Screen: NEGATIVE
Basophils Absolute: 0 10*3/uL (ref 0.0–0.2)
Basos: 0 %
EOS (ABSOLUTE): 0.1 10*3/uL (ref 0.0–0.4)
Eos: 1 %
HCV Ab: NONREACTIVE
HIV Screen 4th Generation wRfx: NONREACTIVE
Hematocrit: 38.5 % (ref 34.0–46.6)
Hemoglobin: 13.1 g/dL (ref 11.1–15.9)
Hepatitis B Surface Ag: NEGATIVE
Immature Grans (Abs): 0 10*3/uL (ref 0.0–0.1)
Immature Granulocytes: 0 %
Lymphocytes Absolute: 2.3 10*3/uL (ref 0.7–3.1)
Lymphs: 24 %
MCH: 30.8 pg (ref 26.6–33.0)
MCHC: 34 g/dL (ref 31.5–35.7)
MCV: 90 fL (ref 79–97)
Monocytes Absolute: 0.5 10*3/uL (ref 0.1–0.9)
Monocytes: 5 %
Neutrophils Absolute: 6.4 10*3/uL (ref 1.4–7.0)
Neutrophils: 70 %
Platelets: 337 10*3/uL (ref 150–450)
RBC: 4.26 x10E6/uL (ref 3.77–5.28)
RDW: 12.4 % (ref 11.7–15.4)
RPR Ser Ql: NONREACTIVE
Rh Factor: POSITIVE
Rubella Antibodies, IGG: <0.9 {index} — ABNORMAL LOW (ref >0.99)
Varicella zoster IgG: REACTIVE
WBC: 9.3 10*3/uL (ref 3.4–10.8)

## 2023-08-29 LAB — COMPREHENSIVE METABOLIC PANEL
ALT: 7 [IU]/L (ref 0–32)
AST: 9 [IU]/L (ref 0–40)
Albumin: 3.8 g/dL — ABNORMAL LOW (ref 4.0–5.0)
Alkaline Phosphatase: 71 [IU]/L (ref 44–121)
BUN/Creatinine Ratio: 18 (ref 9–23)
BUN: 11 mg/dL (ref 6–20)
Bilirubin Total: <0.2 mg/dL (ref 0.0–1.2)
CO2: 21 mmol/L (ref 20–29)
Calcium: 9.3 mg/dL (ref 8.7–10.2)
Chloride: 103 mmol/L (ref 96–106)
Creatinine, Ser: 0.6 mg/dL (ref 0.57–1.00)
Globulin, Total: 2.1 g/dL (ref 1.5–4.5)
Glucose: 83 mg/dL (ref 70–99)
Potassium: 4.8 mmol/L (ref 3.5–5.2)
Sodium: 139 mmol/L (ref 134–144)
Total Protein: 5.9 g/dL — ABNORMAL LOW (ref 6.0–8.5)
eGFR: 125 mL/min/{1.73_m2} (ref >59)

## 2023-08-29 LAB — TSH+FREE T4
Free T4: 1.09 ng/dL (ref 0.82–1.77)
TSH: 0.47 u[IU]/mL (ref 0.450–4.500)

## 2023-08-29 LAB — HCV INTERPRETATION

## 2023-08-30 LAB — CERVICOVAGINAL ANCILLARY ONLY
Bacterial Vaginitis (gardnerella): NEGATIVE
Candida Glabrata: NEGATIVE
Candida Vaginitis: NEGATIVE
Chlamydia: NEGATIVE
Comment: NEGATIVE
Comment: NEGATIVE
Comment: NEGATIVE
Comment: NEGATIVE
Comment: NEGATIVE
Comment: NORMAL
Neisseria Gonorrhea: NEGATIVE
Trichomonas: NEGATIVE

## 2023-09-02 LAB — MATERNIT 21 PLUS CORE, BLOOD
Fetal Fraction: 23
Result (T21): NEGATIVE
Trisomy 13 (Patau syndrome): NEGATIVE
Trisomy 18 (Edwards syndrome): NEGATIVE
Trisomy 21 (Down syndrome): NEGATIVE

## 2023-09-18 ENCOUNTER — Telehealth: Payer: Self-pay

## 2023-09-18 NOTE — Telephone Encounter (Signed)
 Patient reports having a nose bleed intermittently since yesterday. She does have some sinus congestion that she is taking OTC meds for. Advised nose bleeds are normal in pregnancy.  Most nosebleeds during pregnancy are mild and can be treated at home:  Sit upright and lean forward slightly. Pinch the soft part of the nostrils together firmly for 10-15 minutes. Apply a cold compress to the nose. Use a saline nasal spray to moisten the nasal passages.   Seek medical attention if:  The nosebleed lasts for more than 30 minutes. The bleeding is heavy or persistent. You have other symptoms, such as dizziness, fainting, or shortness of breath. You have a history of bleeding disorders.   Prevention: Use a humidifier to keep the nasal passages moist. Avoid blowing your nose forcefully.  She will report to Urgent Care or ED if the issue persists after trying these measures.

## 2023-09-25 ENCOUNTER — Encounter: Payer: MEDICAID | Admitting: Obstetrics and Gynecology

## 2023-09-25 DIAGNOSIS — O0990 Supervision of high risk pregnancy, unspecified, unspecified trimester: Secondary | ICD-10-CM

## 2023-09-30 ENCOUNTER — Encounter: Payer: MEDICAID | Admitting: Obstetrics

## 2023-09-30 ENCOUNTER — Other Ambulatory Visit: Payer: Self-pay | Admitting: Certified Nurse Midwife

## 2023-09-30 DIAGNOSIS — Z0184 Encounter for antibody response examination: Secondary | ICD-10-CM

## 2023-09-30 DIAGNOSIS — Z13220 Encounter for screening for lipoid disorders: Secondary | ICD-10-CM

## 2023-09-30 DIAGNOSIS — Z131 Encounter for screening for diabetes mellitus: Secondary | ICD-10-CM

## 2023-09-30 DIAGNOSIS — F17200 Nicotine dependence, unspecified, uncomplicated: Secondary | ICD-10-CM

## 2023-09-30 DIAGNOSIS — E0590 Thyrotoxicosis, unspecified without thyrotoxic crisis or storm: Secondary | ICD-10-CM

## 2023-09-30 DIAGNOSIS — T7411XS Adult physical abuse, confirmed, sequela: Secondary | ICD-10-CM

## 2023-09-30 DIAGNOSIS — Z3A18 18 weeks gestation of pregnancy: Secondary | ICD-10-CM

## 2023-09-30 DIAGNOSIS — Z1379 Encounter for other screening for genetic and chromosomal anomalies: Secondary | ICD-10-CM

## 2023-09-30 DIAGNOSIS — Z363 Encounter for antenatal screening for malformations: Secondary | ICD-10-CM

## 2023-09-30 DIAGNOSIS — F1120 Opioid dependence, uncomplicated: Secondary | ICD-10-CM

## 2023-09-30 DIAGNOSIS — Z0283 Encounter for blood-alcohol and blood-drug test: Secondary | ICD-10-CM

## 2023-09-30 DIAGNOSIS — D649 Anemia, unspecified: Secondary | ICD-10-CM

## 2023-09-30 DIAGNOSIS — Z113 Encounter for screening for infections with a predominantly sexual mode of transmission: Secondary | ICD-10-CM

## 2023-09-30 DIAGNOSIS — F4310 Post-traumatic stress disorder, unspecified: Secondary | ICD-10-CM

## 2023-09-30 DIAGNOSIS — O0990 Supervision of high risk pregnancy, unspecified, unspecified trimester: Secondary | ICD-10-CM

## 2023-10-07 ENCOUNTER — Ambulatory Visit: Payer: MEDICAID

## 2023-10-07 ENCOUNTER — Ambulatory Visit (INDEPENDENT_AMBULATORY_CARE_PROVIDER_SITE_OTHER): Payer: MEDICAID

## 2023-10-07 ENCOUNTER — Telehealth: Payer: Self-pay

## 2023-10-07 VITALS — BP 115/72 | HR 105 | Wt 155.8 lb

## 2023-10-07 DIAGNOSIS — Z0283 Encounter for blood-alcohol and blood-drug test: Secondary | ICD-10-CM

## 2023-10-07 DIAGNOSIS — K429 Umbilical hernia without obstruction or gangrene: Secondary | ICD-10-CM

## 2023-10-07 DIAGNOSIS — Z8751 Personal history of pre-term labor: Secondary | ICD-10-CM | POA: Diagnosis not present

## 2023-10-07 DIAGNOSIS — O0990 Supervision of high risk pregnancy, unspecified, unspecified trimester: Secondary | ICD-10-CM | POA: Diagnosis not present

## 2023-10-07 DIAGNOSIS — Z3A23 23 weeks gestation of pregnancy: Secondary | ICD-10-CM | POA: Diagnosis not present

## 2023-10-07 DIAGNOSIS — G4700 Insomnia, unspecified: Secondary | ICD-10-CM | POA: Diagnosis not present

## 2023-10-07 MED ORDER — ABDOMINAL BINDER/ELASTIC SMALL MISC: 1.0000 [IU] | Freq: Every day | 0 refills | Status: DC

## 2023-10-07 MED ORDER — HYDROXYZINE HCL 25 MG PO TABS: ORAL_TABLET | ORAL | 3 refills | Status: DC

## 2023-10-07 NOTE — Telephone Encounter (Signed)
 Pt was scheduled for an anatomy scan on 09/17/2023 at 8:15 at [facility].  Pt did not arrive in time for this appt.  She was rescheduled to 10/10/2023 at 1:00 at [facility]).

## 2023-10-07 NOTE — Progress Notes (Signed)
    Return Prenatal Note   Assessment/Plan   Plan  28 y.o. [id number] at [redacted]w[redacted]d presents for follow-up OB visit. Reviewed prenatal record including previous visit note.  Supervision of high risk pregnancy, antepartum - Anatomy US rescheduled for 3/27.  - Rx for hydroxyzine refill provided to help with sleep.  - Urine sample collected today to complete NOB labs.  - Prepared for 1 hour glucola, third trimester labs, and Tdap at next visit.  - Reviewed red flag warning signs anticipatory guidance for upcoming prenatal care.   Umbilical hernia without obstruction and without gangrene - Recommended use of pregnancy support band to help with pain associated with umbilical hernia.    No orders of the defined types were placed in this encounter.  Return in about 4 weeks (around 11/04/2023) for ROB with 1 hour glucola.   Future Appointments  Date Time Provider Department Center  10/10/2023  1:00 PM [facility]    For next visit:  ROB with 1 hour gluocla, third trimester labs, and Tdap     Subjective   28 y.o. [id number] at [redacted]w[redacted]d presents for this follow-up prenatal visit.  Patient has questions about getting refill on medication to help with sleep. She also wants to know what she can do to help with pain associated with umbilical hernia.  Patient reports: Movement: Present Contractions: Not present  Objective   Flow sheet Vitals: Pulse Rate: (!) 105 BP: 115/72 Fundal Height: 23 cm Fetal Heart Rate (bpm): 140 Total weight gain: 15 lb 12.8 oz (7.167 kg)  General Appearance  No acute distress, well appearing, and well nourished Pulmonary   Normal work of breathing Neurologic   Alert and oriented to person, place, and time Psychiatric   Mood and affect within normal limits  Molly Bray, CNM  03/24/259:26 AM

## 2023-10-07 NOTE — Assessment & Plan Note (Signed)
-   Recommended use of pregnancy support band to help with pain associated with umbilical hernia.

## 2023-10-07 NOTE — Addendum Note (Signed)
 Addended by: Sheliah Hatch on: 10/07/2023 09:32 AM   Modules accepted: Orders

## 2023-10-07 NOTE — Assessment & Plan Note (Addendum)
-   Anatomy US rescheduled for 3/27.  - Rx for hydroxyzine refill provided to help with sleep.  - Urine sample collected today to complete NOB labs.  - Prepared for 1 hour glucola, third trimester labs, and Tdap at next visit.  - Reviewed red flag warning signs anticipatory guidance for upcoming prenatal care.

## 2023-10-10 ENCOUNTER — Ambulatory Visit
Admission: RE | Admit: 2023-10-10 | Discharge: 2023-10-10 | Disposition: A | Discharge status: Home / Self Care | Payer: MEDICAID | Source: Ambulatory Visit | Attending: Certified Nurse Midwife | Admitting: Certified Nurse Midwife

## 2023-10-10 DIAGNOSIS — Z363 Encounter for antenatal screening for malformations: Secondary | ICD-10-CM | POA: Diagnosis present

## 2023-10-17 NOTE — Telephone Encounter (Signed)
 Can you add pt to someone schedule tomorrow?

## 2023-10-22 ENCOUNTER — Other Ambulatory Visit: Payer: Self-pay

## 2023-10-22 DIAGNOSIS — O26899 Other specified pregnancy related conditions, unspecified trimester: Secondary | ICD-10-CM

## 2023-10-22 DIAGNOSIS — R112 Nausea with vomiting, unspecified: Secondary | ICD-10-CM

## 2023-10-22 MED ORDER — OMEPRAZOLE MAGNESIUM 20 MG PO TBEC: 20.0000 mg | DELAYED_RELEASE_TABLET | Freq: Every day | ORAL | 2 refills | Status: DC

## 2023-10-22 MED ORDER — PROMETHAZINE HCL 25 MG PO TABS: 25.0000 mg | ORAL_TABLET | Freq: Four times a day (QID) | ORAL | 1 refills | Status: DC | PRN

## 2023-11-04 ENCOUNTER — Other Ambulatory Visit: Payer: Self-pay

## 2023-11-04 ENCOUNTER — Encounter: Payer: MEDICAID | Admitting: Certified Nurse Midwife

## 2023-11-04 ENCOUNTER — Other Ambulatory Visit: Payer: MEDICAID

## 2023-11-04 ENCOUNTER — Telehealth: Payer: Self-pay | Admitting: Certified Nurse Midwife

## 2023-11-04 DIAGNOSIS — Z113 Encounter for screening for infections with a predominantly sexual mode of transmission: Secondary | ICD-10-CM

## 2023-11-04 DIAGNOSIS — D649 Anemia, unspecified: Secondary | ICD-10-CM

## 2023-11-04 DIAGNOSIS — Z131 Encounter for screening for diabetes mellitus: Secondary | ICD-10-CM

## 2023-11-04 NOTE — Telephone Encounter (Signed)
 Reached out to pt to reschedule 1 hr gtt lab that was scheduled on 11/04/2023 at 9:00 and ROB appt that was scheduled at 10:15 on 11/04/2023.  Could not leave a message bc call could not be completed as dialed.  Will send a MyChart message to pt.

## 2023-11-05 ENCOUNTER — Encounter: Payer: Self-pay | Admitting: Certified Nurse Midwife

## 2023-11-05 NOTE — Telephone Encounter (Signed)
 Reached out to pt (2x) to reschedule 1 hr gtt lab that was scheduled on 11/04/2023 at 9:00 and the ROB appt that was scheduled at 10:15 on 11/04/2023 with Alise Appl.  Left message for pt to call back to reschedule.  Will send a MyChart letter to pt.

## 2023-11-07 ENCOUNTER — Other Ambulatory Visit

## 2023-11-07 ENCOUNTER — Encounter: Admitting: Obstetrics

## 2023-11-11 ENCOUNTER — Telehealth: Payer: Self-pay

## 2023-11-11 NOTE — Telephone Encounter (Signed)
 Molly Bray

## 2023-11-12 ENCOUNTER — Telehealth: Payer: Self-pay | Admitting: Licensed Practical Nurse

## 2023-11-12 ENCOUNTER — Other Ambulatory Visit

## 2023-11-12 ENCOUNTER — Encounter: Admitting: Licensed Practical Nurse

## 2023-11-12 NOTE — Telephone Encounter (Signed)
 Reached out to pt to reschedule 11/12/2023 appts-Lab appt (7:55) and ROB appt with LMD at 8:15.  Left message for pt to call back to reschedule.

## 2023-11-13 NOTE — Telephone Encounter (Signed)
 Reached out to pt (2x) to reschedule 11/12/2023 appts-Lab appt (7:55) and ROB appt with LMD at 8:!5.  Was able to reschedule the appts for 11/21/2023-lab appt (10:20) and ROB appt with S. Free (10:55).

## 2023-11-22 ENCOUNTER — Ambulatory Visit (INDEPENDENT_AMBULATORY_CARE_PROVIDER_SITE_OTHER)

## 2023-11-22 ENCOUNTER — Other Ambulatory Visit

## 2023-11-22 VITALS — BP 116/68 | HR 128 | Wt 158.0 lb

## 2023-11-22 DIAGNOSIS — O0990 Supervision of high risk pregnancy, unspecified, unspecified trimester: Secondary | ICD-10-CM | POA: Diagnosis not present

## 2023-11-22 DIAGNOSIS — Z3A30 30 weeks gestation of pregnancy: Secondary | ICD-10-CM | POA: Diagnosis not present

## 2023-11-22 DIAGNOSIS — Z362 Encounter for other antenatal screening follow-up: Secondary | ICD-10-CM

## 2023-11-22 NOTE — Progress Notes (Signed)
    Return Prenatal Note   Assessment/Plan   Plan  [name] at [redacted]w[redacted]d presents for follow-up OB visit. Reviewed prenatal record including previous visit note.  Supervision of high risk pregnancy, antepartum - Seen in ED on 4/13 for preterm contractions, given a dose of terbutaline  and discharged.  - Reviewed need for follow up US  due to incomplete anatomy, order placed.  - Gestational diabetes screening, third trimester labs, Tdap, and BTFC completed today. - Collecting urine today for UDS that has not been completed in this pregnancy. - Reviewed kick counts and preterm labor warning signs. Instructed to call office or come to hospital with persistent headache, vision changes, regular contractions, leaking of fluid, decreased fetal movement or vaginal bleeding.   Orders Placed This Encounter  Procedures   US  OB Follow Up    Standing Status:   Future    Expiration Date:   02/22/2024    Reason for exam::   incomplete anatomy    Preferred imaging location?:   External   Return in about 2 weeks (around 12/06/2023) for ROB.   No future appointments.  For next visit:  continue with routine prenatal care     Subjective   [name] at [redacted]w[redacted]d presents for this follow-up prenatal visit.  Patient doing well. Concerned about preterm labor given her previous history.  Patient reports: Movement: Present Contractions: Not present  Objective   Flow sheet Vitals: Pulse Rate: (!) 128 BP: 116/68 Fundal Height: 29 cm Fetal Heart Rate (bpm): 145 Total weight gain: 18 lb (8.165 kg)  General Appearance  No acute distress, well appearing, and well nourished Pulmonary   Normal work of breathing Neurologic   Alert and oriented to person, place, and time Psychiatric   Mood and affect within normal limits  Verita Glassman Kailynne Ferrington, CNM  11/21/2509:15 AM

## 2023-11-22 NOTE — Assessment & Plan Note (Addendum)
-   Seen in ED on 4/13 for preterm contractions, given a dose of terbutaline  and discharged.  - Reviewed need for follow up US  due to incomplete anatomy, order placed.  - Gestational diabetes screening, third trimester labs, Tdap, and BTFC completed today. - Has not been able to provide urine sample yet in this pregnancy. Will try today while waiting for labs.  - Reviewed kick counts and preterm labor warning signs. Instructed to call office or come to hospital with persistent headache, vision changes, regular contractions, leaking of fluid, decreased fetal movement or vaginal bleeding.

## 2023-11-23 LAB — 28 WEEK RH+PANEL
Basophils Absolute: 0.1 10*3/uL (ref 0.0–0.2)
Basos: 1 %
EOS (ABSOLUTE): 0.2 10*3/uL (ref 0.0–0.4)
Eos: 2 %
Gestational Diabetes Screen: 129 mg/dL (ref 70–139)
HIV Screen 4th Generation wRfx: NONREACTIVE
Hematocrit: 38.1 % (ref 34.0–46.6)
Hemoglobin: 12.6 g/dL (ref 11.1–15.9)
Immature Grans (Abs): 0.1 10*3/uL (ref 0.0–0.1)
Immature Granulocytes: 1 %
Lymphocytes Absolute: 2.2 10*3/uL (ref 0.7–3.1)
Lymphs: 21 %
MCH: 31.2 pg (ref 26.6–33.0)
MCHC: 33.1 g/dL (ref 31.5–35.7)
MCV: 94 fL (ref 79–97)
Monocytes Absolute: 0.6 10*3/uL (ref 0.1–0.9)
Monocytes: 5 %
Neutrophils Absolute: 7.2 10*3/uL — ABNORMAL HIGH (ref 1.4–7.0)
Neutrophils: 70 %
Platelets: 314 10*3/uL (ref 150–450)
RBC: 4.04 x10E6/uL (ref 3.77–5.28)
RDW: 12.2 % (ref 11.7–15.4)
RPR Ser Ql: NONREACTIVE
WBC: 10.3 10*3/uL (ref 3.4–10.8)

## 2023-11-29 ENCOUNTER — Ambulatory Visit: Admission: RE | Admit: 2023-11-29 | Source: Ambulatory Visit

## 2023-12-05 ENCOUNTER — Encounter: Payer: Self-pay | Admitting: Obstetrics

## 2023-12-05 ENCOUNTER — Other Ambulatory Visit: Payer: Self-pay

## 2023-12-05 ENCOUNTER — Observation Stay
Admission: EM | Admit: 2023-12-05 | Discharge: 2023-12-05 | Disposition: A | Discharge status: Home / Self Care | Attending: Certified Nurse Midwife | Admitting: Certified Nurse Midwife

## 2023-12-05 DIAGNOSIS — O4193X0 Disorder of amniotic fluid and membranes, unspecified, third trimester, not applicable or unspecified: Secondary | ICD-10-CM | POA: Diagnosis present

## 2023-12-05 DIAGNOSIS — O99891 Other specified diseases and conditions complicating pregnancy: Secondary | ICD-10-CM | POA: Diagnosis not present

## 2023-12-05 DIAGNOSIS — Z3A32 32 weeks gestation of pregnancy: Secondary | ICD-10-CM | POA: Diagnosis not present

## 2023-12-05 DIAGNOSIS — M545 Low back pain, unspecified: Secondary | ICD-10-CM | POA: Diagnosis not present

## 2023-12-05 DIAGNOSIS — N898 Other specified noninflammatory disorders of vagina: Secondary | ICD-10-CM | POA: Diagnosis not present

## 2023-12-05 DIAGNOSIS — O0990 Supervision of high risk pregnancy, unspecified, unspecified trimester: Principal | ICD-10-CM

## 2023-12-05 DIAGNOSIS — O26893 Other specified pregnancy related conditions, third trimester: Secondary | ICD-10-CM | POA: Diagnosis not present

## 2023-12-05 LAB — RUPTURE OF MEMBRANE (ROM)PLUS: Rom Plus: NEGATIVE

## 2023-12-05 NOTE — OB Triage Note (Signed)
 Patient is a 28 yo, G6 P3, at 32 weeks 2 days. Patient presents with complaints of possible SROM around midnight and back pain.  Patient denies any vaginal bleeding. Patient reports +FM. Monitors applied and assessing. VSS. Initial fetal heart tone 145. Hildy Lowers, CNM notified of patients arrival to unit. Plan to complete ROM+

## 2023-12-05 NOTE — OB Triage Provider Note (Signed)
      L&D OB Triage Note  SUBJECTIVE Molly Bray is a 28 y.o. [id number] female at [redacted]w[redacted]d, EDD Estimated Date of Delivery: 01/28/24 who presented to triage with complaints of leaking of fluid around 0030, pt states it was clear in color with no odor. She states it was a gush then leaking. She notes she is having some mild back pain . She denies vaginal bleeding and is feeling good fetal movement.    OB History  Gravida Para Term Preterm AB Living  6 3 2 1 2 3   SAB IAB Ectopic Multiple Live Births  2 0 0 0 3    # Outcome Date GA Lbr Len/2nd Weight Sex Type Anes PTL Lv  6 Current           5 Term 07/14/19 [redacted]w[redacted]d / 00:08 3600 g M Vag-Spont EPI  LIV     Name: [place],BOY Maryanna     Apgar1: 8  Apgar5: 9  4 Term 08/12/17 [redacted]w[redacted]d 04:00 / 00:42 3420 g M Vag-Spont EPI  LIV     Name: Bohdan Bush     Apgar1: 8  Apgar5: 9  3 Preterm 01/06/13 [redacted]w[redacted]d  2381 g M Vag-Spont  Y LIV     Name: Emeren  2 SAB           1 SAB             No medications prior to admission.     OBJECTIVE  Nursing Evaluation:   LMP 04/23/2023 (Exact Date)    Findings: No fluid seen on pad, pt wearing a panty liner.   ROM plus negative for rupture           NST was performed and has been reviewed by me.  NST INTERPRETATION: Category II  Mode: External Baseline Rate (A): 140 bpm  Moderate variability Accelerations present Deceleration present variable x 1 FHT down to 90s x 40 sec.  Ctx: irritability   ASSESSMENT Impression:  1.  Pregnancy:  [id number] at [redacted]w[redacted]d , EDD Estimated Date of Delivery: 01/28/24 2.  Variable fetal decel x 1 , recommend prolonged monitoring for further surveillance.  3.  Offered wet prep for further testing of vaginal discharge  PLAN Pt decline wet prep and requests to leave. Discussed recommendation for further monitoring due to variable deceleration . She states she has ordered her uber and she can not cancel it as it is to late to cancel . She states she does not have  any more money and if she cancels she will not have a way home. Again reviewed recommend further monitoring she declines . PT left unit against medical advice. Pt walked off of the unit before being able to review warning signs and symptoms.    Will have office call pt to have her come into the office for further evaluation  (NST) ASAP.   Alise Appl, CNM

## 2023-12-06 ENCOUNTER — Encounter: Admitting: Advanced Practice Midwife

## 2023-12-06 ENCOUNTER — Encounter: Payer: Self-pay | Admitting: Obstetrics and Gynecology

## 2023-12-06 ENCOUNTER — Other Ambulatory Visit

## 2023-12-06 ENCOUNTER — Other Ambulatory Visit: Payer: Self-pay

## 2023-12-06 ENCOUNTER — Telehealth: Payer: Self-pay | Admitting: Advanced Practice Midwife

## 2023-12-06 ENCOUNTER — Inpatient Hospital Stay
Admission: EM | Admit: 2023-12-06 | Discharge: 2023-12-06 | Discharge status: Against Medical Advice | Attending: Emergency Medicine | Admitting: Emergency Medicine

## 2023-12-06 DIAGNOSIS — O0990 Supervision of high risk pregnancy, unspecified, unspecified trimester: Secondary | ICD-10-CM

## 2023-12-06 DIAGNOSIS — O368130 Decreased fetal movements, third trimester, not applicable or unspecified: Secondary | ICD-10-CM | POA: Diagnosis present

## 2023-12-06 DIAGNOSIS — Z3A32 32 weeks gestation of pregnancy: Secondary | ICD-10-CM | POA: Diagnosis not present

## 2023-12-06 DIAGNOSIS — Z5321 Procedure and treatment not carried out due to patient leaving prior to being seen by health care provider: Secondary | ICD-10-CM | POA: Diagnosis not present

## 2023-12-06 NOTE — Progress Notes (Signed)
 Pt states she has to leave. Explained we would like her to stay and see the CNM. She states she has to get the car back and she lives 40 min away. Pt wants to leave AMA. Signed AMA paper. CNM aware

## 2023-12-06 NOTE — Telephone Encounter (Signed)
 Reached out to pt to reschedule NST and ROB appt that was scheduled on 12/06/2023-NST at 10:30 and ROB appt with JEG at 10:55.  Could not leave a message bc voice mail was not set up.
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
## 2023-12-10 ENCOUNTER — Encounter: Payer: Self-pay | Admitting: Advanced Practice Midwife

## 2023-12-10 NOTE — Telephone Encounter (Signed)
 Reached out to pt (2x) to reschedule NST and ROB appt that was scheduled on 12/06/2023-NST at 10:30 and ROB appt with JEG at 10:55.  Left message for pt to call back.  Will send a MyChart letter to pt.

## 2023-12-23 ENCOUNTER — Ambulatory Visit (INDEPENDENT_AMBULATORY_CARE_PROVIDER_SITE_OTHER)

## 2023-12-23 VITALS — BP 109/76 | HR 120 | Wt 160.0 lb

## 2023-12-23 DIAGNOSIS — O0993 Supervision of high risk pregnancy, unspecified, third trimester: Secondary | ICD-10-CM

## 2023-12-23 DIAGNOSIS — O0990 Supervision of high risk pregnancy, unspecified, unspecified trimester: Secondary | ICD-10-CM

## 2023-12-23 DIAGNOSIS — Z3A34 34 weeks gestation of pregnancy: Secondary | ICD-10-CM | POA: Diagnosis not present

## 2023-12-23 NOTE — Progress Notes (Signed)
    Return Prenatal Note   Assessment/Plan   Plan  [name] at [redacted]w[redacted]d presents for follow-up OB visit. Reviewed prenatal record including previous visit note.  Supervision of high risk pregnancy, antepartum - Reviewed need to schedule follow up US . - Prepared for GC/CT and GBS screening at next visit. Had questions about additional STI testing. Reviewed RPR screening on admission and if she desires HIV repeat testing at that time, she can request.  - Reviewed kick counts and preterm labor warning signs. Instructed to call office or come to hospital with persistent headache, vision changes, regular contractions, leaking of fluid, decreased fetal movement or vaginal bleeding.   No orders of the defined types were placed in this encounter.  Return in about 2 weeks (around 01/06/2024) for ROB.   No future appointments.  For next visit:  ROB with GBS screening      Subjective   [name] at [redacted]w[redacted]d presents for this follow-up prenatal visit.  Patient doing well, no concerns today.  Patient reports: Movement: Present Contractions: Not present  Objective   Flow sheet Vitals: Pulse Rate: (!) 120 BP: 109/76 Fundal Height: 33 cm Fetal Heart Rate (bpm): 140 Presentation: Vertex Total weight gain: 20 lb (9.072 kg)  General Appearance  No acute distress, well appearing, and well nourished Pulmonary   Normal work of breathing Neurologic   Alert and oriented to person, place, and time Psychiatric   Mood and affect within normal limits  Molly Bray, CNM  06/09/259:15 AM

## 2023-12-23 NOTE — Assessment & Plan Note (Addendum)
-   Reviewed need to schedule follow up US . - Prepared for GC/CT and GBS screening at next visit. Had questions about additional STI testing. Reviewed RPR screening on admission and if she desires HIV repeat testing at that time, she can request.  - Reviewed kick counts and preterm labor warning signs. Instructed to call office or come to hospital with persistent headache, vision changes, regular contractions, leaking of fluid, decreased fetal movement or vaginal bleeding.

## 2024-01-02 ENCOUNTER — Encounter

## 2024-01-02 ENCOUNTER — Other Ambulatory Visit

## 2024-01-02 ENCOUNTER — Telehealth: Payer: Self-pay

## 2024-01-02 NOTE — Telephone Encounter (Signed)
 Reached out to pt to reschedule appts that were scheduled on 01/02/2024-Anatomy Scan at 8:15 and ROB appt with S. Free at 9:35. Could not leave a message bc mailbox was not set up.

## 2024-01-06 ENCOUNTER — Other Ambulatory Visit: Payer: Self-pay

## 2024-01-06 ENCOUNTER — Encounter: Payer: Self-pay | Admitting: Certified Nurse Midwife

## 2024-01-06 ENCOUNTER — Observation Stay
Admission: EM | Admit: 2024-01-06 | Discharge: 2024-01-06 | Disposition: A | Discharge status: Home / Self Care | Attending: Obstetrics and Gynecology | Admitting: Certified Nurse Midwife

## 2024-01-06 ENCOUNTER — Telehealth: Payer: Self-pay

## 2024-01-06 DIAGNOSIS — O26899 Other specified pregnancy related conditions, unspecified trimester: Secondary | ICD-10-CM | POA: Diagnosis present

## 2024-01-06 DIAGNOSIS — O1203 Gestational edema, third trimester: Secondary | ICD-10-CM | POA: Diagnosis present

## 2024-01-06 DIAGNOSIS — O0990 Supervision of high risk pregnancy, unspecified, unspecified trimester: Principal | ICD-10-CM

## 2024-01-06 DIAGNOSIS — R109 Unspecified abdominal pain: Secondary | ICD-10-CM

## 2024-01-06 DIAGNOSIS — O26893 Other specified pregnancy related conditions, third trimester: Secondary | ICD-10-CM

## 2024-01-06 DIAGNOSIS — Z3A36 36 weeks gestation of pregnancy: Secondary | ICD-10-CM

## 2024-01-06 LAB — COMPREHENSIVE METABOLIC PANEL WITH GFR
ALT: 16 U/L (ref 0–44)
AST: 19 U/L (ref 15–41)
Albumin: 2.4 g/dL — ABNORMAL LOW (ref 3.5–5.0)
Alkaline Phosphatase: 131 U/L — ABNORMAL HIGH (ref 38–126)
Anion gap: 8 (ref 5–15)
BUN: 13 mg/dL (ref 6–20)
CO2: 21 mmol/L — ABNORMAL LOW (ref 22–32)
Calcium: 8.4 mg/dL — ABNORMAL LOW (ref 8.9–10.3)
Chloride: 108 mmol/L (ref 98–111)
Creatinine, Ser: 0.5 mg/dL (ref 0.44–1.00)
GFR, Estimated: >60 mL/min (ref >60)
Glucose, Bld: 111 mg/dL — ABNORMAL HIGH (ref 70–99)
Potassium: 3.7 mmol/L (ref 3.5–5.1)
Sodium: 137 mmol/L (ref 135–145)
Total Bilirubin: 0.5 mg/dL (ref 0.0–1.2)
Total Protein: 5.6 g/dL — ABNORMAL LOW (ref 6.5–8.1)

## 2024-01-06 LAB — URINALYSIS, ROUTINE W REFLEX MICROSCOPIC
Bilirubin Urine: NEGATIVE
Glucose, UA: NEGATIVE mg/dL
Hgb urine dipstick: NEGATIVE
Ketones, ur: NEGATIVE mg/dL
Nitrite: NEGATIVE
Protein, ur: 30 mg/dL — AB
Specific Gravity, Urine: 1.029 (ref 1.005–1.030)
pH: 5 (ref 5.0–8.0)

## 2024-01-06 LAB — CBC
HCT: 30.1 % — ABNORMAL LOW (ref 36.0–46.0)
Hemoglobin: 10.3 g/dL — ABNORMAL LOW (ref 12.0–15.0)
MCH: 30 pg (ref 26.0–34.0)
MCHC: 34.2 g/dL (ref 30.0–36.0)
MCV: 87.8 fL (ref 80.0–100.0)
Platelets: 277 10*3/uL (ref 150–400)
RBC: 3.43 MIL/uL — ABNORMAL LOW (ref 3.87–5.11)
RDW: 12.9 % (ref 11.5–15.5)
WBC: 9.4 10*3/uL (ref 4.0–10.5)
nRBC: 0 % (ref 0.0–0.2)

## 2024-01-06 LAB — WET PREP, GENITAL
Clue Cells Wet Prep HPF POC: NONE SEEN
Sperm: NONE SEEN
Trich, Wet Prep: NONE SEEN
WBC, Wet Prep HPF POC: <10 (ref <10)
Yeast Wet Prep HPF POC: NONE SEEN

## 2024-01-06 LAB — PROTEIN / CREATININE RATIO, URINE
Creatinine, Urine: 347 mg/dL
Protein Creatinine Ratio: 0.08 mg/mg{creat} (ref 0.00–0.15)
Total Protein, Urine: 27 mg/dL

## 2024-01-06 LAB — CHLAMYDIA/NGC RT PCR (ARMC ONLY)
Chlamydia Tr: NOT DETECTED
N gonorrhoeae: NOT DETECTED

## 2024-01-06 LAB — RAPID HIV SCREEN (HIV 1/2 AB+AG)
HIV 1/2 Antibodies: NONREACTIVE
HIV-1 P24 Antigen - HIV24: NONREACTIVE

## 2024-01-06 MED ORDER — ACETAMINOPHEN 500 MG PO TABS: 1000.0000 mg | ORAL_TABLET | Freq: Four times a day (QID) | ORAL | Status: DC | PRN

## 2024-01-06 MED ORDER — LACTATED RINGERS IV SOLN: 500.0000 mL | INTRAVENOUS | Status: DC | PRN

## 2024-01-06 MED ORDER — ONDANSETRON HCL 4 MG/2ML IJ SOLN: 4.0000 mg | Freq: Four times a day (QID) | INTRAMUSCULAR | Status: DC | PRN

## 2024-01-06 MED ORDER — SOD CITRATE-CITRIC ACID 500-334 MG/5ML PO SOLN: 30.0000 mL | ORAL | Status: DC | PRN

## 2024-01-06 NOTE — OB Triage Note (Signed)
Pt discharged home per order.   Pt stable and ambulatory and an After Visit Summary was printed and given to the patient. Discharge education completed with patient/family including follow up instructions, appointments, and medication list. Pt received labor and bleeding precautions. Patient able to verbalize understanding, all questions fully answered upon discharge.   Pt discharged home via personal vehicle with support person.  

## 2024-01-06 NOTE — Telephone Encounter (Signed)
 Reached out to pt (2x) to reschedule appts that were scheduled on 01/02/2024-Anatomy scan at 8:15 and ROB appt with S. Free at 9:35.  Could not leave a message bc call could not be completed at this time.  Will send a MyChart letter.

## 2024-01-06 NOTE — Discharge Summary (Signed)
 LABOR & DELIVERY OB TRIAGE NOTE  SUBJECTIVE  HPI Molly Bray is a 28 y.o. [phone] at [redacted]w[redacted]d who presents to Labor & Delivery for swelling in her legs, right sided pain that has been going on for several weeks but has worsened today. Endorses fetal movement, denies loss of fluid, vaginal bleeding, headache or visual changes. Desires STI screening collected with labs today.  OB History     Gravida  6   Para  3   Term  2   Preterm  1   AB  2   Living  3      SAB  2   IAB      Ectopic      Multiple  0   Live Births  3           Scheduled Meds: Continuous Infusions:  lactated ringers      PRN Meds:.acetaminophen , lactated ringers , ondansetron , sodium citrate-citric acid   OBJECTIVE  BP 107/64   Pulse 95   Temp 98.3 F (36.8 C) (Oral)   Resp 18   LMP 04/23/2023 (Exact Date)   General: A&Ox4, NAD Heart: regular rate Lungs: normal work of breathing Abdomen: tender to palpation on right ribs, no evidence of injury, gravid. Cervical exam:   deferred  NST I reviewed the NST and it was reactive.  Baseline: 135 Variability: moderate Accelerations: present, 15x15 Decelerations: absent Toco: irregular, 3-79m, mild, soft resting tone Category I  Results for orders placed or performed during the hospital encounter of 01/06/24 (from the past 24 hours)  Urinalysis, Routine w reflex microscopic -Urine, Clean Catch     Status: Abnormal   Collection Time: 01/06/24  1:43 PM  Result Value Ref Range   Color, Urine AMBER (A) YELLOW   APPearance CLOUDY (A) CLEAR   Specific Gravity, Urine 1.029 1.005 - 1.030   pH 5.0 5.0 - 8.0   Glucose, UA NEGATIVE NEGATIVE mg/dL   Hgb urine dipstick NEGATIVE NEGATIVE   Bilirubin Urine NEGATIVE NEGATIVE   Ketones, ur NEGATIVE NEGATIVE mg/dL   Protein, ur 30 (A) NEGATIVE mg/dL   Nitrite NEGATIVE NEGATIVE   Leukocytes,Ua TRACE (A) NEGATIVE   RBC / HPF 0-5 0 - 5 RBC/hpf   WBC, UA 11-20 0 - 5 WBC/hpf   Bacteria, UA MANY  (A) NONE SEEN   Squamous Epithelial / HPF 6-10 0 - 5 /HPF   Mucus PRESENT    Ca Oxalate Crys, UA PRESENT   Protein / creatinine ratio, urine     Status: None   Collection Time: 01/06/24  1:43 PM  Result Value Ref Range   Creatinine, Urine 347 mg/dL   Total Protein, Urine 27 mg/dL   Protein Creatinine Ratio 0.08 0.00 - 0.15 mg/mg[Cre]  Chlamydia/NGC rt PCR ([facility] only)     Status: None   Collection Time: 01/06/24  1:43 PM   Specimen: Urine, Clean Catch  Result Value Ref Range   Specimen source GC/Chlam ENDOCERVICAL    Chlamydia Tr NOT DETECTED NOT DETECTED   N gonorrhoeae NOT DETECTED NOT DETECTED  Wet prep, genital     Status: None   Collection Time: 01/06/24  1:43 PM   Specimen: Urine, Clean Catch  Result Value Ref Range   Yeast Wet Prep HPF POC NONE SEEN NONE SEEN   Trich, Wet Prep NONE SEEN NONE SEEN   Clue Cells Wet Prep HPF POC NONE SEEN NONE SEEN   WBC, Wet Prep HPF POC <10 <10   Sperm NONE SEEN   CBC  Status: Abnormal   Collection Time: 01/06/24  2:59 PM  Result Value Ref Range   WBC 9.4 4.0 - 10.5 K/uL   RBC 3.43 (L) 3.87 - 5.11 MIL/uL   Hemoglobin 10.3 (L) 12.0 - 15.0 g/dL   HCT 69.8 (L) 63.9 - 53.9 %   MCV 87.8 80.0 - 100.0 fL   MCH 30.0 26.0 - 34.0 pg   MCHC 34.2 30.0 - 36.0 g/dL   RDW 87.0 88.4 - 84.4 %   Platelets 277 150 - 400 K/uL   nRBC 0.0 0.0 - 0.2 %  Comprehensive metabolic panel     Status: Abnormal   Collection Time: 01/06/24  2:59 PM  Result Value Ref Range   Sodium 137 135 - 145 mmol/L   Potassium 3.7 3.5 - 5.1 mmol/L   Chloride 108 98 - 111 mmol/L   CO2 21 (L) 22 - 32 mmol/L   Glucose, Bld 111 (H) 70 - 99 mg/dL   BUN 13 6 - 20 mg/dL   Creatinine, Ser 9.49 0.44 - 1.00 mg/dL   Calcium  8.4 (L) 8.9 - 10.3 mg/dL   Total Protein 5.6 (L) 6.5 - 8.1 g/dL   Albumin 2.4 (L) 3.5 - 5.0 g/dL   AST 19 15 - 41 U/L   ALT 16 0 - 44 U/L   Alkaline Phosphatase 131 (H) 38 - 126 U/L   Total Bilirubin 0.5 0.0 - 1.2 mg/dL   GFR, Estimated >39 >39 mL/min    Anion gap 8 5 - 15      ASSESSMENT Impression  1) Pregnancy at H3E7876, [redacted]w[redacted]d, Estimated Date of Delivery: 01/28/24 2) Reassuring maternal/fetal status 3) No evidence of pre-eclampsia based on normotensive blood pressures, normal CBC, CMP & UP/C 4) Musculoskeletal discomforts of pregnancy 5) concern for STI exposure  PLAN Discharge home with preterm labor, fetal movement & pre-eclampsia precautions. Comfort measures for MSK pain reviewed. Encouraged increased hydration, elevation of legs. Offered to make ROB appointment prior to discharge, due to transportation issues she needs to check with her ride prior to making next visit. Non-urgent medical transportation phone number for [employer] provided. STI screening negative, RPR not yet resulted. GBS pending. Harlene LITTIE Cisco, CNM 01/06/24  3:41 PM

## 2024-01-06 NOTE — OB Triage Note (Signed)
 G6P3 presents to L&D triage w/ right upper abdominal pain and increased swelling in her feet. She denies vision disturbances, headache, ctx's, vaginal bleeding. She endorses fetal movement. Fetal monitors applied and assessing, Jayne CNM aware of pt arrival. BP's cycling Q15, initial BP was 113/70. Labs being collected and processed including PIH eval and STD screening.

## 2024-01-06 NOTE — Telephone Encounter (Signed)
 Pt calling triage stating she has signs of preeclampsia. Hand and leg swelling and having sharp pain under boob. States if she lays down flat pain is not horrible, but once she gets up its bad. States baby movement is good with a little decrease. Denies leakage of fluid, pelvic pain, and/or vag spotting bleeding. Advised to have someone drive her to ER.

## 2024-01-07 LAB — RPR: RPR Ser Ql: NONREACTIVE

## 2024-01-09 LAB — CULTURE, BETA STREP (GROUP B ONLY)

## 2024-01-21 ENCOUNTER — Encounter: Admitting: Obstetrics

## 2024-01-25 ENCOUNTER — Other Ambulatory Visit: Payer: Self-pay

## 2024-01-25 ENCOUNTER — Inpatient Hospital Stay
Admission: EM | Admit: 2024-01-25 | Discharge: 2024-01-27 | DRG: 807 | Disposition: A | Discharge status: Home / Self Care | Attending: Licensed Practical Nurse | Admitting: Licensed Practical Nurse

## 2024-01-25 ENCOUNTER — Encounter: Payer: Self-pay | Admitting: Obstetrics

## 2024-01-25 DIAGNOSIS — Z833 Family history of diabetes mellitus: Secondary | ICD-10-CM | POA: Diagnosis not present

## 2024-01-25 DIAGNOSIS — O99334 Smoking (tobacco) complicating childbirth: Principal | ICD-10-CM

## 2024-01-25 DIAGNOSIS — K429 Umbilical hernia without obstruction or gangrene: Secondary | ICD-10-CM | POA: Diagnosis not present

## 2024-01-25 DIAGNOSIS — O0933 Supervision of pregnancy with insufficient antenatal care, third trimester: Secondary | ICD-10-CM | POA: Diagnosis not present

## 2024-01-25 DIAGNOSIS — Z3A39 39 weeks gestation of pregnancy: Secondary | ICD-10-CM

## 2024-01-25 DIAGNOSIS — O99344 Other mental disorders complicating childbirth: Secondary | ICD-10-CM

## 2024-01-25 DIAGNOSIS — O479 False labor, unspecified: Principal | ICD-10-CM | POA: Diagnosis present

## 2024-01-25 DIAGNOSIS — O99413 Diseases of the circulatory system complicating pregnancy, third trimester: Secondary | ICD-10-CM | POA: Diagnosis not present

## 2024-01-25 DIAGNOSIS — F1190 Opioid use, unspecified, uncomplicated: Secondary | ICD-10-CM | POA: Diagnosis present

## 2024-01-25 DIAGNOSIS — F17210 Nicotine dependence, cigarettes, uncomplicated: Secondary | ICD-10-CM | POA: Diagnosis present

## 2024-01-25 DIAGNOSIS — F419 Anxiety disorder, unspecified: Secondary | ICD-10-CM

## 2024-01-25 DIAGNOSIS — O99324 Drug use complicating childbirth: Secondary | ICD-10-CM | POA: Diagnosis present

## 2024-01-25 DIAGNOSIS — I959 Hypotension, unspecified: Secondary | ICD-10-CM

## 2024-01-25 DIAGNOSIS — O9962 Diseases of the digestive system complicating childbirth: Secondary | ICD-10-CM | POA: Diagnosis not present

## 2024-01-25 DIAGNOSIS — O26893 Other specified pregnancy related conditions, third trimester: Secondary | ICD-10-CM | POA: Diagnosis present

## 2024-01-25 LAB — TYPE AND SCREEN
ABO/RH(D): O POS
Antibody Screen: NEGATIVE

## 2024-01-25 LAB — CBC
HCT: 36.5 % (ref 36.0–46.0)
Hemoglobin: 12.4 g/dL (ref 12.0–15.0)
MCH: 29.7 pg (ref 26.0–34.0)
MCHC: 34 g/dL (ref 30.0–36.0)
MCV: 87.5 fL (ref 80.0–100.0)
Platelets: 279 K/uL (ref 150–400)
RBC: 4.17 MIL/uL (ref 3.87–5.11)
RDW: 13.3 % (ref 11.5–15.5)
WBC: 15.1 K/uL — ABNORMAL HIGH (ref 4.0–10.5)
nRBC: 0 % (ref 0.0–0.2)

## 2024-01-25 LAB — URINE DRUG SCREEN, QUALITATIVE (ARMC ONLY)
Amphetamines, Ur Screen: POSITIVE — AB
Barbiturates, Ur Screen: NOT DETECTED
Benzodiazepine, Ur Scrn: NOT DETECTED
Cannabinoid 50 Ng, Ur ~~LOC~~: NOT DETECTED
Cocaine Metabolite,Ur ~~LOC~~: NOT DETECTED
MDMA (Ecstasy)Ur Screen: NOT DETECTED
Methadone Scn, Ur: NOT DETECTED
Opiate, Ur Screen: NOT DETECTED
Phencyclidine (PCP) Ur S: NOT DETECTED
Tricyclic, Ur Screen: NOT DETECTED

## 2024-01-25 MED ORDER — LIDOCAINE HCL (PF) 1 % IJ SOLN: 30.0000 mL | INTRAMUSCULAR | Status: DC | PRN

## 2024-01-25 MED ORDER — ONDANSETRON HCL 4 MG PO TABS: 4.0000 mg | ORAL_TABLET | ORAL | Status: DC | PRN

## 2024-01-25 MED ORDER — OXYTOCIN 10 UNIT/ML IJ SOLN
INTRAMUSCULAR | Status: AC
  Filled 2024-01-25: qty 1

## 2024-01-25 MED ORDER — DOCUSATE SODIUM 100 MG PO CAPS
100.0000 mg | ORAL_CAPSULE | Freq: Two times a day (BID) | ORAL | Status: DC
  Administered 2024-01-26: 100 mg via ORAL
  Filled 2024-01-25 (×3): qty 1

## 2024-01-25 MED ORDER — COCONUT OIL OIL: 1.0000 | TOPICAL_OIL | Status: DC | PRN

## 2024-01-25 MED ORDER — LACTATED RINGERS IV SOLN: INTRAVENOUS | Status: DC

## 2024-01-25 MED ORDER — ONDANSETRON HCL 4 MG/2ML IJ SOLN: 4.0000 mg | Freq: Four times a day (QID) | INTRAMUSCULAR | Status: DC | PRN

## 2024-01-25 MED ORDER — OXYTOCIN-SODIUM CHLORIDE 30-0.9 UT/500ML-% IV SOLN
INTRAVENOUS | Status: AC
  Filled 2024-01-25: qty 500

## 2024-01-25 MED ORDER — ZOLPIDEM TARTRATE 5 MG PO TABS: 5.0000 mg | ORAL_TABLET | Freq: Every evening | ORAL | Status: DC | PRN

## 2024-01-25 MED ORDER — PRENATAL MULTIVITAMIN CH
1.0000 | ORAL_TABLET | Freq: Every day | ORAL | Status: DC
  Administered 2024-01-25: 1 via ORAL
  Filled 2024-01-25 (×2): qty 1

## 2024-01-25 MED ORDER — IBUPROFEN 600 MG PO TABS
600.0000 mg | ORAL_TABLET | Freq: Four times a day (QID) | ORAL | Status: DC
  Administered 2024-01-25 – 2024-01-26 (×4): 600 mg via ORAL
  Filled 2024-01-25 (×6): qty 1

## 2024-01-25 MED ORDER — OXYCODONE HCL 5 MG PO TABS: 5.0000 mg | ORAL_TABLET | ORAL | Status: DC | PRN

## 2024-01-25 MED ORDER — BENZOCAINE-MENTHOL 20-0.5 % EX AERO
1.0000 | INHALATION_SPRAY | CUTANEOUS | Status: DC | PRN
  Administered 2024-01-25: 1 via TOPICAL
  Filled 2024-01-25: qty 56

## 2024-01-25 MED ORDER — DIPHENHYDRAMINE HCL 25 MG PO CAPS: 25.0000 mg | ORAL_CAPSULE | Freq: Four times a day (QID) | ORAL | Status: DC | PRN

## 2024-01-25 MED ORDER — OXYTOCIN BOLUS FROM INFUSION: 333.0000 mL | Freq: Once | INTRAVENOUS | Status: DC

## 2024-01-25 MED ORDER — OXYTOCIN-SODIUM CHLORIDE 30-0.9 UT/500ML-% IV SOLN: 2.5000 [IU]/h | INTRAVENOUS | Status: DC

## 2024-01-25 MED ORDER — SOD CITRATE-CITRIC ACID 500-334 MG/5ML PO SOLN: 30.0000 mL | ORAL | Status: DC | PRN

## 2024-01-25 MED ORDER — DIBUCAINE (PERIANAL) 1 % EX OINT: 1.0000 | TOPICAL_OINTMENT | CUTANEOUS | Status: DC | PRN

## 2024-01-25 MED ORDER — WITCH HAZEL-GLYCERIN EX PADS: 1.0000 | MEDICATED_PAD | CUTANEOUS | Status: DC | PRN

## 2024-01-25 MED ORDER — LIDOCAINE HCL (PF) 1 % IJ SOLN
INTRAMUSCULAR | Status: AC
  Filled 2024-01-25: qty 30

## 2024-01-25 MED ORDER — ONDANSETRON HCL 4 MG/2ML IJ SOLN: 4.0000 mg | INTRAMUSCULAR | Status: DC | PRN

## 2024-01-25 MED ORDER — SIMETHICONE 80 MG PO CHEW: 80.0000 mg | CHEWABLE_TABLET | ORAL | Status: DC | PRN

## 2024-01-25 MED ORDER — LACTATED RINGERS IV SOLN: 500.0000 mL | INTRAVENOUS | Status: DC | PRN

## 2024-01-25 MED ORDER — ACETAMINOPHEN 500 MG PO TABS
1000.0000 mg | ORAL_TABLET | Freq: Four times a day (QID) | ORAL | Status: DC
  Administered 2024-01-25 – 2024-01-26 (×4): 1000 mg via ORAL
  Filled 2024-01-25 (×6): qty 2

## 2024-01-25 MED ORDER — OXYCODONE HCL 5 MG PO TABS: 10.0000 mg | ORAL_TABLET | ORAL | Status: DC | PRN

## 2024-01-25 NOTE — Discharge Summary (Signed)
 Postpartum Discharge Summary  Date of Service updated***     Patient Name: Molly Bray DOB: [birth year] MRN: [id number]  Date of admission: 01/25/2024 Delivery date:01/25/2024 Delivering provider: DELINDA MELNICK MARIE Date of discharge: 01/25/2024  Admitting diagnosis: Uterine contractions [O47.9] Intrauterine pregnancy: [redacted]w[redacted]d     Secondary diagnosis:  Principal Problem:   Uterine contractions  Additional problems:  Hypotension, Umbilical Hernia,  Anxiety, hx of Fentanyl  and Heroine use currently denies drug use.      Discharge diagnosis: Term Pregnancy Delivered                                              Post partum procedures:{Postpartum procedures:23558} Augmentation: N/A Complications: None  Hospital course: Onset of Labor With Vaginal Delivery      28 y.o. yo H3E7876 at 101w4d was admitted in Active Labor on 01/25/2024. Labor course was complicated byNA  Membrane Rupture Time/Date: 4:50 AM,01/25/2024  Delivery Method:Vaginal, Spontaneous Operative Delivery:N/A Episiotomy: None Lacerations:    Patient had a postpartum course complicated by ***.  She is ambulating, tolerating a regular diet, passing flatus, and urinating well. Patient is discharged home in stable condition on 01/25/24.  Newborn Data: Birth date:01/25/2024 Birth time:5:02 AM Gender:Female Living status:Living Apgars:8 ,9  Weight:3610 g  Magnesium  Sulfate received: No BMZ received: No Rhophylac:No MMR:{MMR:30440033} T-DaP:{Tdap:23962} Flu: {Qol:76036} RSV Vaccine received: No Transfusion:{Transfusion received:30440034} Immunizations administered: Immunization History  Administered Date(s) Administered   Tdap 06/28/2017, 05/22/2019    Physical exam  Vitals:   01/25/24 0546  BP: 127/80  Pulse: 80  Resp: 17  Temp: 98.4 F (36.9 C)  TempSrc: Oral   General: {Exam; general:21111117} Lochia: {Desc; appropriate/inappropriate:30686::appropriate} Uterine Fundus: {Desc;  firm/soft:30687} Incision: {Exam; incision:21111123} DVT Evaluation: {Exam; icu:7888877} Labs: Lab Results  Component Value Date   WBC 15.1 (H) 01/25/2024   HGB 12.4 01/25/2024   HCT 36.5 01/25/2024   MCV 87.5 01/25/2024   PLT 279 01/25/2024      Latest Ref Rng & Units 01/06/2024    2:59 PM  CMP  Glucose 70 - 99 mg/dL 888   BUN 6 - 20 mg/dL 13   Creatinine 9.55 - 1.00 mg/dL 9.49   Sodium 864 - 854 mmol/L 137   Potassium 3.5 - 5.1 mmol/L 3.7   Chloride 98 - 111 mmol/L 108   CO2 22 - 32 mmol/L 21   Calcium  8.9 - 10.3 mg/dL 8.4   Total Protein 6.5 - 8.1 g/dL 5.6   Total Bilirubin 0.0 - 1.2 mg/dL 0.5   Alkaline Phos 38 - 126 U/L 131   AST 15 - 41 U/L 19   ALT 0 - 44 U/L 16    Edinburgh Score:    09/11/2019    4:00 PM  Edinburgh Postnatal Depression Scale Screening Tool  I have been able to laugh and see the funny side of things. 0  I have looked forward with enjoyment to things. 0  I have blamed myself unnecessarily when things went wrong. 1  I have been anxious or worried for no good reason. 3  I have felt scared or panicky for no good reason. 3  Things have been getting on top of me. 1  I have been so unhappy that I have had difficulty sleeping. 2  I have felt sad or miserable. 0  I have been so unhappy that I have been  crying. 0  The thought of harming myself has occurred to me. 0  Edinburgh Postnatal Depression Scale Total 10      Data saved with a previous flowsheet row definition      After visit meds:  Allergies as of 01/25/2024       Reactions   Amoxicillin Anaphylaxis   Vancomycin  Itching   Patient with obvious redmans syndrome   Penicillins Swelling   Has patient had a PCN reaction causing immediate rash, facial/tongue/throat swelling, SOB or lightheadedness with hypotension: Yes Has patient had a PCN reaction causing severe rash involving mucus membranes or skin necrosis: No Has patient had a PCN reaction that required hospitalization No Has  patient had a PCN reaction occurring within the last 10 years: {Yes/No:30480221} not sure  If all of the above answers are NO, then may proceed with Cephalosporin use.   Silver Sulfadiazine Other (See Comments)   Pain/burning sensation     Med Rec must be completed prior to using this [facility]***        Discharge home in stable condition Infant Feeding: Bottle Infant Disposition:{CHL IP OB HOME WITH FNUYZM:76418} Discharge instruction: per After Visit Summary and Postpartum booklet. Activity: Advance as tolerated. Pelvic rest for 6 weeks.  Diet: routine diet Anticipated Birth Control: Unsure considering BTL, would like it done as the same time as her hernia repair  Postpartum Appointment:2 weeks Additional Postpartum F/U: Postpartum Depression checkup Future Appointments: Future Appointments  Date Time Provider Department Center  01/31/2024  9:55 AM Jayne Harlene CROME, CNM [facility] None   Follow up Visit:  Follow-up Information     Ehsan Corvin, Jinnie Jansky, CNM Follow up in 2 week(s).   Specialty: Obstetrics and Gynecology Why: 2 and 6 wk PP visit.   in person at 2wks w/ MD if tubal ligation desired Contact information: [address] [place] [address] [phone]                     01/25/2024 [name] Zoie Sarin, CNM
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
## 2024-01-25 NOTE — H&P (Cosign Needed Addendum)
 Molly Bray is a 28 y.o. female presenting for uterine contractions. Contractions started about 20 minutes before she arrived, her water broke once she arrived.  Molly Bray was brought in by EMS, she reported an urge to push as they were about to enter the elevator. PT had a SVB of viable female at 95.   Molly Bray started her prenatal care at 18weeks. She has had intermittent care. Her pregnancy is complicated by Hypotension, Umbilical Hernia,  tobacco use, Anxiety, hx of Fentanyl  and Heroine use currently denies drug use.   OB History     Gravida  6   Para  3   Term  2   Preterm  1   AB  2   Living  3      SAB  2   IAB      Ectopic      Multiple  0   Live Births  3          Past Medical History:  Diagnosis Date   ADHD    Anxiety    Asthma    Chronic kidney disease    KIDNEY REFLUX   Depression    GERD (gastroesophageal reflux disease)    WITH PREGNANCY ONLY   Headache    MIGRAINES   History of methicillin resistant staphylococcus aureus (MRSA) 2008   IBS (irritable bowel syndrome)    constipation   Pinched nerve    PTSD (post-traumatic stress disorder)    Pyelonephritis    Recurrent UTI    Past Surgical History:  Procedure Laterality Date   CHOLECYSTECTOMY N/A 03/07/2018   Procedure: LAPAROSCOPIC CHOLECYSTECTOMY;  Surgeon: Tye Millet, DO;  Location: [facility] ORS;  Service: General;  Laterality: N/A;   galbladder removed  2019   KIDNEY SURGERY     Family History: family history includes ADD / ADHD in her mother and sister; COPD in her father and mother; Diabetes in her father, maternal aunt, and mother; Learning disabilities in her sister. Social History:  reports that she has been smoking cigarettes and e-cigarettes. She has a 8 pack-year smoking history. She has never used smokeless tobacco. She reports that she does not currently use alcohol. She reports current drug use. Drugs: Heroin and Fentanyl .     Maternal Diabetes: No Genetic  Screening: Normal Maternal Ultrasounds/Referrals: Other: suboptimal  views of RVOT, repeat US  not done  Fetal Ultrasounds or other Referrals:  None Maternal Substance Abuse:  Yes:  Type: Smoker, Other: Fentanyl , Heroine  Significant Maternal Medications:  None Significant Maternal Lab Results:  Group B Strep negative Number of Prenatal Visits:greater than 3 verified prenatal visits Maternal Vaccinations:NA  Other Comments:  None  Review of Systems History see HPI    Last menstrual period 04/23/2023, unknown if currently breastfeeding. Maternal Exam:  Introitus: Normal vulva.   Physical Exam Constitutional:      Appearance: Normal appearance.  Cardiovascular:     Rate and Rhythm: Normal rate.  Pulmonary:     Effort: Pulmonary effort is normal.  Abdominal:     Tenderness: There is no abdominal tenderness.  Genitourinary:    General: Normal vulva.  Musculoskeletal:     Cervical back: Normal range of motion.     Right lower leg: No edema.     Left lower leg: No edema.  Skin:    General: Skin is warm.  Neurological:     General: No focal deficit present.     Mental Status: She is alert.  Psychiatric:  Mood and Affect: Mood normal.     Prenatal labs: ABO, Rh: O/Positive/-- (02/12 1420) Antibody: Negative (02/12 1420) Rubella: <0.90 (02/12 1420) RPR: NON REACTIVE (06/23 1459)  HBsAg: Negative (02/12 1420)  HIV: NON REACTIVE (06/23 1459)  GBS:     Assessment/Plan: H3E7876 s/p SVB at 39wks4d  -Pt taken to LDR2 for assessment and management of the third stage  -Labs ordered -Unable to obtain urine sample with straight catheter, declined repeat cathe    Molly Bray [facility] 01/25/2024, 6:00 AM

## 2024-01-25 NOTE — Discharge Instructions (Signed)

## 2024-01-26 LAB — CBC
HCT: 28.4 % — ABNORMAL LOW (ref 36.0–46.0)
Hemoglobin: 9.6 g/dL — ABNORMAL LOW (ref 12.0–15.0)
MCH: 30 pg (ref 26.0–34.0)
MCHC: 33.8 g/dL (ref 30.0–36.0)
MCV: 88.8 fL (ref 80.0–100.0)
Platelets: 255 K/uL (ref 150–400)
RBC: 3.2 MIL/uL — ABNORMAL LOW (ref 3.87–5.11)
RDW: 13.4 % (ref 11.5–15.5)
WBC: 13.8 K/uL — ABNORMAL HIGH (ref 4.0–10.5)
nRBC: 0 % (ref 0.0–0.2)

## 2024-01-26 NOTE — Progress Notes (Signed)
 Pt requesting FOB to have second band. This nurse applied to FOB and instructed about visitation

## 2024-01-26 NOTE — Progress Notes (Signed)
 [facility]  Subjective:  Doing well postpartum day 1; she is tolerating regular diet, pain is controlled with PO medications, ambulating and voiding without difficulty. No concerns at this time.  Objective:  Vital signs in last 24 hours: Temp:  [98 F (36.7 C)-98.5 F (36.9 C)] 98.5 F (36.9 C) (07/13 0834) Pulse Rate:  [90-104] 96 (07/13 0834) Resp:  [18] 18 (07/13 0834) BP: (107-116)/(67-87) 110/67 (07/13 0834) SpO2:  [94 %-99 %] 95 % (07/13 0834)    General: NAD Pulmonary: no increased work of breathing Abdomen: non-distended, non-tender, fundus firm at level of umbilicus Extremities: no edema, no erythema, no tenderness  Results for orders placed or performed during the hospital encounter of 01/25/24 (from the past 72 hours)  CBC     Status: Abnormal   Collection Time: 01/25/24  5:24 AM  Result Value Ref Range   WBC 15.1 (H) 4.0 - 10.5 K/uL   RBC 4.17 3.87 - 5.11 MIL/uL   Hemoglobin 12.4 12.0 - 15.0 g/dL   HCT 63.4 63.9 - 53.9 %   MCV 87.5 80.0 - 100.0 fL   MCH 29.7 26.0 - 34.0 pg   MCHC 34.0 30.0 - 36.0 g/dL   RDW 86.6 88.4 - 84.4 %   Platelets 279 150 - 400 K/uL   nRBC 0.0 0.0 - 0.2 %    Comment: Performed at [facility], [address] [place], [place] [address]  Type and screen [facility]     Status: None   Collection Time: 01/25/24  5:25 AM  Result Value Ref Range   ABO/RH(D) O POS    Antibody Screen NEG    Sample Expiration      01/28/2024,2359 Performed at [facility], [address] [place], [place] [address]   Urine Drug Screen, Qualitative ([facility] only)     Status: Abnormal   Collection Time: 01/25/24  2:37 PM  Result Value Ref Range   Tricyclic, Ur Screen NONE DETECTED NONE DETECTED   Amphetamines, Ur Screen POSITIVE (A) NONE DETECTED    Comment: (NOTE) Trazodone is metabolized in vivo to several metabolites, including pharmacologically active m-CPP, which is excreted in the urine. Immunoassay  screens for amphetamines and MDMA have potential cross-reactivity with these compounds and may provide false positive  results.     MDMA (Ecstasy)Ur Screen NONE DETECTED NONE DETECTED   Cocaine Metabolite,Ur [place] NONE DETECTED NONE DETECTED   Opiate, Ur Screen NONE DETECTED NONE DETECTED   Phencyclidine (PCP) Ur S NONE DETECTED NONE DETECTED   Cannabinoid 50 Ng, Ur  NONE DETECTED NONE DETECTED   Barbiturates, Ur Screen NONE DETECTED NONE DETECTED   Benzodiazepine, Ur Scrn NONE DETECTED NONE DETECTED   Methadone Scn, Ur NONE DETECTED NONE DETECTED    Comment: (NOTE) Tricyclics + metabolites, urine    Cutoff 1000 ng/mL Amphetamines + metabolites, urine  Cutoff 1000 ng/mL MDMA (Ecstasy), urine              Cutoff 500 ng/mL Cocaine Metabolite, urine          Cutoff 300 ng/mL Opiate + metabolites, urine        Cutoff 300 ng/mL Phencyclidine (PCP), urine         Cutoff 25 ng/mL Cannabinoid, urine                 Cutoff 50 ng/mL Barbiturates + metabolites, urine  Cutoff 200 ng/mL Benzodiazepine, urine  Cutoff 200 ng/mL Methadone, urine                   Cutoff 300 ng/mL  The urine drug screen provides only a preliminary, unconfirmed analytical test result and should not be used for non-medical purposes. Clinical consideration and professional judgment should be applied to any positive drug screen result due to possible interfering substances. A more specific alternate chemical method must be used in order to obtain a confirmed analytical result. Gas chromatography / mass spectrometry (GC/MS) is the preferred confirm atory method. Performed at [facility], [address] [place], [place] [address]   CBC     Status: Abnormal   Collection Time: 01/26/24  5:26 AM  Result Value Ref Range   WBC 13.8 (H) 4.0 - 10.5 K/uL   RBC 3.20 (L) 3.87 - 5.11 MIL/uL   Hemoglobin 9.6 (L) 12.0 - 15.0 g/dL   HCT 71.5 (L) 63.9 - 53.9 %   MCV 88.8 80.0 - 100.0 fL   MCH 30.0 26.0 -  34.0 pg   MCHC 33.8 30.0 - 36.0 g/dL   RDW 86.5 88.4 - 84.4 %   Platelets 255 150 - 400 K/uL   nRBC 0.0 0.0 - 0.2 %    Comment: Performed at [facility], [address] [place], [place] [address]    Assessment:   28 y.o. [id number] postpartum day # 1  Plan:    1) Acute blood loss anemia - hemodynamically stable and asymptomatic- not clinically significant for this admission. - po ferrous sulfate  2) Blood Type --/--/O POS (07/12 0525) / Rubella <0.90 (02/12 1420) / Varicella Immune  3) TDAP status declines  4) Feeding plan formula  5)  Education given regarding options for contraception, as well as compatibility with breast feeding if applicable.  Patient considering tubal ligation for contraception.  6) Disposition: continue current care   Slater Rains, CNM [facility] 01/26/2024 1:53 PM

## 2024-01-26 NOTE — TOC CM/SW Note (Signed)
..  Transition of Care [facility]) - Inpatient Brief Assessment   Patient Details  Name: Molly Bray MRN: [id number] Date of Birth: 06/07/1996  Transition of Care [facility]) CM/SW Contact:    Edsel DELENA Fischer, LCSW Phone Number: 01/26/2024, 1:07 PM   Clinical Narrative:  SW contacted DSS CPS at [phone]. SW spoke with Lauraine.  SW provided pt name, address on file, contact number pt provided during sw consult, sex and ethnicity of baby and that mom tested positive for Amphetamines .  Phone was disconnected. SW called DSS again.  Waiting on call back.  SW received call back from [place].  Report completed.  Transition of Care Asessment:

## 2024-01-26 NOTE — TOC Progression Note (Signed)
 Transition of Care (TOC) - Progression Note   SW spoke with Lauraine- DSS CPS at [phone]. Lauraine stated that she spoke with her supervisor.  Since baby does not have any withdrawals reported, report will not be screen in at this time however if baby tests positive to contact DSS. Nursing staff notified.   Patient Details  Name: Molly Bray MRN: [id number] Date of Birth: 1995-10-03  Transition of Care [facility]) CM/SW Contact  Edsel DELENA Fischer, LCSW Phone Number: 01/26/2024, 3:23 PM  Clinical Narrative:         Barriers to Discharge: Continued Medical Work up  Expected Discharge Plan and Services In-house Referral: Clinical Social Work     Living arrangements for the past 2 months:  (pt did not disclsoure address and only stated she lives in [place], [place])                                       Social Determinants of Health (SDOH) Interventions SDOH Screenings   Food Insecurity: No Food Insecurity (01/25/2024)  Housing: High Risk (01/25/2024)  Transportation Needs: Unmet Transportation Needs (01/25/2024)  Utilities: Not At Risk (01/25/2024)  Alcohol Screen: Low Risk  (04/28/2020)  Depression (PHQ2-9): Low Risk  (04/28/2020)  Financial Resource Strain: Low Risk  (04/23/2019)  Physical Activity: Unknown (04/23/2019)  Social Connections: Moderately Isolated (04/23/2019)  Stress: Stress Concern Present (04/23/2019)  Tobacco Use: High Risk (01/25/2024)    Readmission Risk Interventions     No data to display

## 2024-01-27 NOTE — TOC CM/SW Note (Addendum)
 Patient has orders to discharge home today. Cord blood results are still pending. CSW will continue to monitor.  Lauraine Carpen, CSW [phone]  9:56 am: CSW tried calling CPS. No answer. Will try again later.  Lauraine Carpen, CSW [phone]  11:13 am: CSW is still trying to reach CPS. Have been on hold for extended periods of time.  Lauraine Carpen, CSW [phone]  3:03 pm: CSW made a new CPS report.  Lauraine Carpen, CSW [phone]

## 2024-01-27 NOTE — Progress Notes (Signed)
 Discussed with patient at 9am that baby's birth certificate and [place] Depression Scale need to be filled out. Patient stated that she will not fill out the birth certificate at this time. States she is waiting to talk to the father of the baby. At 2:40 pm, patient still hasn't filled out forms. Escalated situation to management. Informed patient that it is unsafe to keep baby in the bed, especially while she's sleeping. Patient continues to keep baby in the bed with her. Explained to patient when I took baby's temperature that it was on the low end of normal and that baby doesn't regulate body temperature well yet and educated patient on the need to keep baby covered up. Management entered patient's room and baby was laying on the bed in only a diaper.

## 2024-01-27 NOTE — Progress Notes (Signed)
 Discussed with patient discharge.  Patient stated that she didn't know why she was being discharged and not her infant.  I told her it was for monitoring for withdrawal, which the providers have discussed with her prior.  Patient asked withdrawal from what?  Patient has also been educated on this by providers prior to this.

## 2024-01-27 NOTE — Progress Notes (Signed)
 Pt discharged, infant staying for ESC.  Discharge instructions, prescriptions and follow up appointment given to and reviewed with pt. Pt verbalized understanding. Patient staying in room with infant/

## 2024-01-31 ENCOUNTER — Encounter: Admitting: Certified Nurse Midwife

## 2024-02-11 ENCOUNTER — Telehealth (INDEPENDENT_AMBULATORY_CARE_PROVIDER_SITE_OTHER): Admitting: Licensed Practical Nurse

## 2024-02-11 DIAGNOSIS — Z1332 Encounter for screening for maternal depression: Secondary | ICD-10-CM | POA: Diagnosis not present

## 2024-02-11 DIAGNOSIS — F53 Postpartum depression: Secondary | ICD-10-CM | POA: Diagnosis not present

## 2024-02-11 MED ORDER — SERTRALINE HCL 50 MG PO TABS: 50.0000 mg | ORAL_TABLET | Freq: Every day | ORAL | 2 refills | Status: AC

## 2024-02-11 NOTE — Progress Notes (Unsigned)
 Virtual Visit via Video Note  I connected with Molly Bray on 02/11/24 at  3:35 PM EDT by a video enabled telemedicine application and verified that I am speaking with the correct person using two identifiers.  Location: Patient:  a friend's house in [place] .  Provider: office, in [place], [place]    I discussed the limitations of evaluation and management by telemedicine and the availability of in person appointments. The patient expressed understanding and agreed to proceed.  History of Present Illness: SVB 01/25/24 precipitous birth in hallway of ED, first degree not repaired, baby boy 3619 grams  Pt was not able to take infant home, infant with CPS Molly Bray  reports she has had a vaginal odor that smells like death since since she gave birth, her bleeding is on and off and brown in color, she does have an occasional cramp.  Sleep is all over the place, sometimes sleep too much other times she cannot sleep d/t depression Appetite is decreased No concerns with voiding or stooling  Her milk has dried up She is currently staying at a friends place, feels safe.  She is trying to get into [facility] treatment program  Mood: Molly Bray is having a difficult time with the fact that CPS has her baby. Her mood has  been all over the place, sometimes she is overly emotionally and other times she has no emotions at all.  She admits that did have thoughts of self harm once or twice immediately after she left the hospital. She does not have a plan, she is 100% sure she will nt harm herself. She would like to try medication, she has used it in the past. She has not left the house.   Observations/Objective: GEN: tired appearing but NAD EPDS 15   Assessment and Plan: Postpartum 2 wk PPD   Follow Up Instructions: Concern for PPD: discussed seeking treatment with [facility] psych. Will start Zoloft  today 25mg  daily x7 days and then increase to 50mg  daily. There is room to  increase dose past 50mg .  Continue to make an effort to get [facility].   May go outside for exercise.   Front desk messaged to schedule pt for in office visit to assess vaginal odor.    I discussed the assessment and treatment plan with the patient. The patient was provided an opportunity to ask questions and all were answered. The patient agreed with the plan and demonstrated an understanding of the instructions.   The patient was advised to call back or seek an in-person evaluation if the symptoms worsen or if the condition fails to improve as anticipated.  I provided 20 minutes of non-face-to-face time during this encounter.   Molly Bray Molly Bray, CNM

## 2024-02-12 ENCOUNTER — Telehealth: Payer: Self-pay

## 2024-02-12 ENCOUNTER — Ambulatory Visit: Admitting: Certified Nurse Midwife

## 2024-02-12 NOTE — Telephone Encounter (Signed)
 Left message for patient to call back, patient has an appt today at 4:15PM and would like to move appt up to a sooner time this afternoon. KW

## 2024-02-13 ENCOUNTER — Encounter: Payer: Self-pay | Admitting: Licensed Practical Nurse

## 2024-03-11 ENCOUNTER — Ambulatory Visit: Admitting: Licensed Practical Nurse

## 2024-03-24 ENCOUNTER — Ambulatory Visit: Admitting: Licensed Practical Nurse

## 2024-04-16 NOTE — Progress Notes (Deleted)
   OBSTETRICS POSTPARTUM CLINIC PROGRESS NOTE  Subjective:     Molly Bray is a 28 y.o. [phone] female who presents for a postpartum visit. She is 12 weeks postpartum following a spontaneous vaginal delivery. I have reviewed the prenatal and intrapartum course. The delivery was at 39.4 gestational weeks.  Anesthesia: none. Postpartum course has been difficult. Baby is with CPS.  Patient has been trying to get into the [facility] program.  Bleeding: patient {HAS HAS WNU:81165} not resumed menses, with No LMP recorded.. Bowel function is {normal:32111}. Bladder function is {normal:32111}. Patient {is/is not:9024} sexually active. Contraception method desired is {contraceptive method:5051}. Postpartum depression screening: {neg default:13464::negative}.  EDPS score is ***.    The following portions of the patient's history were reviewed and updated as appropriate: allergies, current medications, past family history, past medical history, past social history, past surgical history, and problem list.  Review of Systems {ros; complete:30496}   Objective:    There were no vitals taken for this visit.  General:  alert and no distress   Breasts:  inspection negative, no nipple discharge or bleeding, no masses or nodularity palpable  Lungs: clear to auscultation bilaterally  Heart:  regular rate and rhythm, S1, S2 normal, no murmur, click, rub or gallop  Abdomen: soft, non-tender; bowel sounds normal; no masses,  no organomegaly.  ***Well healed Pfannenstiel incision   Vulva:  normal  Vagina: normal vagina, no discharge, exudate, lesion, or erythema  Cervix:  no cervical motion tenderness and no lesions  Corpus: normal size, contour, position, consistency, mobility, non-tender  Adnexa:  normal adnexa and no mass, fullness, tenderness  Rectal Exam: Not performed.         Labs:  Lab Results  Component Value Date   HGB 9.6 (L) 01/26/2024     Assessment:   No diagnosis found.    Plan:    1. Contraception: {method:5051} 2. Will check Hgb for h/o postpartum anemia of less than 10.  3. Follow up in: {1-10:13787} {time; units:19136} or as needed.    Vicci Rollo BRAVO, RN [facility]

## 2024-04-21 ENCOUNTER — Ambulatory Visit: Admitting: Obstetrics

## 2024-04-21 DIAGNOSIS — Z124 Encounter for screening for malignant neoplasm of cervix: Secondary | ICD-10-CM

## 2024-04-21 DIAGNOSIS — Z113 Encounter for screening for infections with a predominantly sexual mode of transmission: Secondary | ICD-10-CM

## 2024-05-08 NOTE — Progress Notes (Deleted)
 GYNECOLOGY ANNUAL PHYSICAL EXAM NOTE  Subjective:    Molly Bray is a 28 y.o. [phone] female who presents for an annual exam.  The patient {is/is not/has never been:13135} sexually active. The patient participates in regular exercise: {yes/no/not asked:9010}. Has the patient ever been transfused or tattooed?: {yes/no/not asked:9010}. The patient reports that there {is/is not:9024} domestic violence in her life. The patient has completed the Gardasil vaccine: {yes/no:311178}  The patient has the following complaints today: Patient requests STD screen and birth control  Menstrual History: Menarche age: *** No LMP recorded.     Gynecologic History:  Contraception: {method:5051} History of STI's: Trich 2024 Last Pap: 09/11/2019. Results were: normal.   History of abnormal pap(s): *** Last mammogram: NA       OB History  Gravida Para Term Preterm AB Living  6 4 3 1 2 4   SAB IAB Ectopic Multiple Live Births  2 0 0 0 4    # Outcome Date GA Lbr Len/2nd Weight Sex Type Anes PTL Lv  6 Term 01/25/24 [redacted]w[redacted]d  7 lb 15.3 oz (3.61 kg) M Vag-Spont None  LIV     Name: Boy Rishita Petron     Apgar1: 8  Apgar5: 9  5 Term 07/14/19 [redacted]w[redacted]d / 00:08 7 lb 15 oz (3.6 kg) M Vag-Spont EPI  LIV     Name: Matto,BOY Brittini     Apgar1: 8  Apgar5: 9  4 Term 08/12/17 [redacted]w[redacted]d 04:00 / 00:42 7 lb 8.6 oz (3.42 kg) M Vag-Spont EPI  LIV     Name: Wendling,BOY Celisse     Apgar1: 8  Apgar5: 9  3 Preterm 01/06/13 [redacted]w[redacted]d  5 lb 4 oz (2.381 kg) M Vag-Spont  Y LIV     Name: Emeren  2 SAB           1 SAB             Past Medical History:  Diagnosis Date   ADHD    Anxiety    Asthma    Chronic kidney disease    KIDNEY REFLUX   Depression    GERD (gastroesophageal reflux disease)    WITH PREGNANCY ONLY   Headache    MIGRAINES   History of methicillin resistant staphylococcus aureus (MRSA) 2008   IBS (irritable bowel syndrome)    constipation   Pinched nerve    PTSD (post-traumatic stress  disorder)    Pyelonephritis    Recurrent UTI     Past Surgical History:  Procedure Laterality Date   CHOLECYSTECTOMY N/A 03/07/2018   Procedure: LAPAROSCOPIC CHOLECYSTECTOMY;  Surgeon: Tye Millet, DO;  Location: [facility] ORS;  Service: General;  Laterality: N/A;   galbladder removed  2019   KIDNEY SURGERY      Family History  Problem Relation Age of Onset   COPD Mother    Diabetes Mother    ADD / ADHD Mother    ADD / ADHD Sister    Learning disabilities Sister    Diabetes Maternal Aunt    COPD Father    Diabetes Father    Hypertension Neg Hx    Ovarian cancer Neg Hx    Breast cancer Neg Hx    Heart disease Neg Hx     Social History   Socioeconomic History   Marital status: Legally Separated    Spouse name: Not on file   Number of children: Not on file   Years of education: Not on file   Highest education level: Not  on file  Occupational History   Not on file  Tobacco Use   Smoking status: Every Day    Current packs/day: 1.00    Average packs/day: 1 pack/day for 8.0 years (8.0 ttl pk-yrs)    Types: Cigarettes, E-cigarettes   Smokeless tobacco: Never  Vaping Use   Vaping status: Some Days  Substance and Sexual Activity   Alcohol use: Not Currently    Comment: last use age 2 per pt   Drug use: Yes    Types: Heroin, Fentanyl     Comment: last use 02/24/23 snorted   Sexual activity: Yes    Partners: Male    Birth control/protection: None    Comment: planning tubal  Other Topics Concern   Not on file  Social History Narrative   Not on file   Social Drivers of Health   Financial Resource Strain: Low Risk  (04/23/2019)   Overall Financial Resource Strain (CARDIA)    Difficulty of Paying Living Expenses: Not hard at all  Food Insecurity: No Food Insecurity (01/25/2024)   Hunger Vital Sign    Worried About Running Out of Food in the Last Year: Never true    Ran Out of Food in the Last Year: Never true  Transportation Needs: Unmet Transportation Needs (01/25/2024)    PRAPARE - Transportation    Lack of Transportation (Medical): Yes    Lack of Transportation (Non-Medical): Yes  Physical Activity: Unknown (04/23/2019)   Exercise Vital Sign    Days of Exercise per Week: Patient declined    Minutes of Exercise per Session: Patient declined  Stress: Stress Concern Present (04/23/2019)   [employer] of Occupational Health - Occupational Stress Questionnaire    Feeling of Stress : Very much  Social Connections: Moderately Isolated (04/23/2019)   Social Connection and Isolation Panel    Frequency of Communication with Friends and Family: More than three times a week    Frequency of Social Gatherings with Friends and Family: Once a week    Attends Religious Services: More than 4 times per year    Active Member of [employer] or Organizations: No    Attends Banker Meetings: Never    Marital Status: Separated  Intimate Partner Violence: Not At Risk (01/25/2024)   Humiliation, Afraid, Rape, and Kick questionnaire    Fear of Current or Ex-Partner: No    Emotionally Abused: No    Physically Abused: No    Sexually Abused: No    Current Outpatient Medications on File Prior to Visit  Medication Sig Dispense Refill   omeprazole  (PRILOSEC  OTC) 20 MG tablet Take 20 mg by mouth daily.     Prenatal Vit-Fe Fumarate-FA (MULTIVITAMIN-PRENATAL) 27-0.8 MG TABS tablet Take 1 tablet by mouth daily at 12 noon.     sertraline  (ZOLOFT ) 50 MG tablet Take 1 tablet (50 mg total) by mouth daily. Take 25mg  (half a tablet) for 7 days then increase to 50mg  (1 tablet) daily 30 tablet 2   No current facility-administered medications on file prior to visit.    Allergies  Allergen Reactions   Amoxicillin Anaphylaxis   Vancomycin  Itching    Patient with obvious redmans syndrome   Penicillins Swelling    Has patient had a PCN reaction causing immediate rash, facial/tongue/throat swelling, SOB or lightheadedness with hypotension: Yes Has patient had a PCN reaction  causing severe rash involving mucus membranes or skin necrosis: No Has patient had a PCN reaction that required hospitalization No Has patient had a PCN reaction occurring within  the last 10 years: {Yes/No:30480221} not sure  If all of the above answers are NO, then may proceed with Cephalosporin use.   Silver Sulfadiazine Other (See Comments)    Pain/burning sensation     Review of Systems Constitutional: negative for chills, fatigue, fevers and sweats Eyes: negative for irritation, redness and visual disturbance Ears, nose, mouth, throat, and face: negative for hearing loss, nasal congestion, snoring and tinnitus Respiratory: negative for asthma, cough, sputum Cardiovascular: negative for chest pain, dyspnea, exertional chest pressure/discomfort, irregular heart beat, palpitations and syncope Gastrointestinal: negative for abdominal pain, change in bowel habits, nausea and vomiting Genitourinary: negative for abnormal menstrual periods, genital lesions, sexual problems and vaginal discharge, dysuria and urinary incontinence Integument/breast: negative for breast lump, breast tenderness and nipple discharge Hematologic/lymphatic: negative for bleeding and easy bruising Musculoskeletal:negative for back pain and muscle weakness Neurological: negative for dizziness, headaches, vertigo and weakness Endocrine: negative for diabetic symptoms including polydipsia, polyuria and skin dryness Allergic/Immunologic: negative for hay fever and urticaria      Objective:  not currently breastfeeding. There is no height or weight on file to calculate BMI.    General Appearance:    Alert, cooperative, no distress, appears stated age  Head:    Normocephalic, without obvious abnormality, atraumatic  Eyes:    Conjunctiva/corneas clear, EOMs intact bilaterally  Ears:    Normal external ear canals bilaterally  Nose:   Nares normal  Throat:   Lips, mucosa, and tongue normal; teeth and gums normal   Neck:   Supple, symmetrical, trachea midline, no adenopathy; thyroid: no enlargement/tenderness/nodules; no carotid bruit or JVD  Back:     ROM normal, no CVA tenderness  Lungs:     Clear to auscultation bilaterally, respirations unlabored  Chest Wall:    No tenderness or deformity   Heart:    Regular rate and rhythm, S1 and S2 normal, no appreciated murmur, rub or gallop  Breast Exam:    No tenderness, masses, or nipple abnormality; no skin retraction, dimpling or nipple discharge.  Abdomen:     Soft, non-tender, bowel sounds active all four quadrants, no masses, no organomegaly.    Genitalia:    Pelvic: external genitalia normal, vagina without lesions, discharge, or tenderness, rectovaginal septum  normal. Cervix normal in appearance, no cervical motion tenderness, no adnexal masses or tenderness.  Uterus normal size, shape, mobile, regular contours, nontender.  Rectal:    Normal external sphincter.  No hemorrhoids appreciated. Internal exam not done.   Extremities:   Extremities normal, atraumatic, no cyanosis or edema  Pulses:   2+ and symmetric all extremities  Skin:   Skin color, texture, turgor normal, no rashes or lesions  Lymph nodes:   Cervical, supraclavicular, and axillary nodes normal  Neurologic:   CNII-XII grossly intact, normal strength, sensation and reflexes throughout   .  Labs:  Lab Results  Component Value Date   WBC 13.8 (H) 01/26/2024   HGB 9.6 (L) 01/26/2024   HCT 28.4 (L) 01/26/2024   MCV 88.8 01/26/2024   PLT 255 01/26/2024    Lab Results  Component Value Date   CREATININE 0.50 01/06/2024   BUN 13 01/06/2024   NA 137 01/06/2024   K 3.7 01/06/2024   CL 108 01/06/2024   CO2 21 (L) 01/06/2024    Lab Results  Component Value Date   ALT 16 01/06/2024   AST 19 01/06/2024   ALKPHOS 131 (H) 01/06/2024   BILITOT 0.5 01/06/2024    Lab Results  Component  Value Date   TSH 0.470 08/28/2023     Assessment:   No diagnosis found.   Plan:    Zeppelin Beckstrand is a 28 y.o. [phone] female here today for her annual exam, doing well.  Pap: done with cotesting today  *** w/rflx today Mammogram: ordered***due *** Labs: ***A1C, CMP, HepC, Lipid panel, Vit D, TSH PHQ-2 = ***, discussed coping techniques; RTC if worsens or develops concern Contraception: *** Vaccines: Gardasil complete Healthy lifestyle modifications discussed: multivitamin, diet, exercise, sunscreen. Emphasized importance of regular physical activity.  Folic Acid recommendation reviewed.  All questions answered to patient's satisfaction.   Follow up 1 yr for annual, sooner prn.    Estil Mangle, DO [facility]

## 2024-05-12 ENCOUNTER — Ambulatory Visit: Admitting: Obstetrics

## 2024-05-12 DIAGNOSIS — Z01419 Encounter for gynecological examination (general) (routine) without abnormal findings: Secondary | ICD-10-CM

## 2024-05-12 DIAGNOSIS — Z124 Encounter for screening for malignant neoplasm of cervix: Secondary | ICD-10-CM

## 2024-05-12 DIAGNOSIS — Z113 Encounter for screening for infections with a predominantly sexual mode of transmission: Secondary | ICD-10-CM

## 2024-05-18 NOTE — ED Provider Notes (Signed)
 [facility] Emergency Department Provider Note   History, MDM, ED Course   History: Molly Bray is a 28 y.o. female with with a past medical history of chronic back pain, migraines, HLD, anxiety, PTSD, bipolar 1 disorder, fentanyl  and heroine use who presents for an off balance sensation. The patient reports 3 days of vomiting (which spontaneously resolved after the first day), a waxing and waning off-balance sensation, and intermittent headaches. She further endorses ear pain, and blood from her ear after sticking her finger in her left ear. The patient notes breaking her tooth 2 days ago such that while talking, the tooth cuts her tongue. No history of vertigo. She does not go to a land. The patient smokes cigarettes (0.5-1 PPD). No EtOH or recreational drug use. Denies shortness of breath, cough, dysuria, hematuria, abdominal pain, neck stiffness, runny nose, fevers or chills.   Pertinent exam: On exam, the patient appears nontoxic and in no acute distress. Vital signs are remarkable for initial tachycardia to 104 but otherwise within normal limits. Patient is afebrile and hemodynamically stable. Physical exam notable for dental fracture of a right mandibular molar. Area of excoriation to inferior aspect of left ear canal with some surrounding dried blood. TM clear bilaterally. No nystagmus. No nuchal rigidity. Left-sided mastoid tenderness to palpation without overlying erythema. Left lateral neck tenderness to palpation. Strong carotid pulses. No midline C-spine tenderness to palpation. No step offs or deformities.  Normal strength and sensation throughout. Cranial nerves 2-12 intact.  Impression/Ddx: 28 year old female with history and physical exam as above.  Based on history I am concerned for some sort of vestibular syndrome.  She has some history elements that suggest a potential central cause.  Is presently setting of what sounds like violent retching and then development of  left-sided neck pain I am potentially concerned for some sort of vascular abnormality that could be causing her symptoms.  I discussed this with the patient in detail and that the next step to evaluate for this would be obtaining IV access and performing CT imaging.  She states that she does not want this and would prefer some medication to make her feel better.  I discussed the risks of not having this imaging including worsening symptoms, stroke, intracranial hemorrhage, long-term disability without being able to evaluate for these.  Despite understanding the risks and the details of the CT scan she does not want to proceed with imaging.  Will plan to administer meclizine and then reassess.  Diagnostic work-up as below: Orders Placed This Encounter  Procedures   RAPID INFLUENZA/RSV/COVID PCR   XR Chest 2 views   CBC w/ Differential   Comprehensive metabolic panel   ECG 12 Lead    Progress Notes: I attempted to reassess the patient after meclizine administration.  Patient was not in the room.  Staff could not find her in the emergency department.  It appears that she had eloped in the emergency department without being able to be reassessed. __________________________________________________________________  The case was discussed with the attending physician who is in agreement with the above assessment and plan.  Additional Medical Decision Making   - Any discussion of this patient's case/presentation between myself and consultants, admitting teams, or other team members has been documented above. - Imaging and other studies, if performed, that were available during my care of the patient were independently reviewed and interpreted by me and considered in my medical decision making as documented above. - External records reviewed: 05/18/24 [facility] Internal Med  note for PMH - Consideration of admission, observation, transfer, or escalation of care: Considered imaging as discussed above  MDM.  History   Chief Complaint:  Chief Complaint  Patient presents with   Dizziness    History of Present Illness:  As documented above.  Additional history during this encounter provided by: Boyfriend at bedside.  Past Medical History: Past Medical History[1]  Medications:  No current facility-administered medications for this encounter.  Current Outpatient Medications:    acetaminophen  (TYLENOL ) 325 MG tablet, Take 2 tablets (650 mg total) by mouth every four (4) hours as needed for pain., Disp: 30 tablet, Rfl: 0   bacitracin 500 unit/gram ointment, Apply 1 application topically daily., Disp: 120 g, Rfl: 2   cyclobenzaprine  HCl (FLEXERIL  ORAL), Take 10 mg by mouth two (2) times a day as needed. , Disp: , Rfl:    eye patch Pads, 1 patch by Miscellaneous route daily as needed., Disp: 50 each, Rfl: 0   gabapentin (NEURONTIN) 400 MG capsule, Take 400 mg by mouth Three (3) times a day., Disp: , Rfl:    ibuprofen  (MOTRIN ) 600 MG tablet, Take 1 tablet (600 mg total) by mouth every six (6) hours as needed for pain., Disp: 30 tablet, Rfl: 0   ibuprofen  (MOTRIN ) 600 MG tablet, Take 1 tablet (600 mg total) by mouth every eight (8) hours as needed for pain., Disp: 10 tablet, Rfl: 0   JUNEL FE 1/20, 28, 1 mg-20 mcg (21)/75 mg (7) per tablet, Take 1 tablet by mouth daily., Disp: , Rfl:    naproxen  (NAPROSYN ) 500 MG tablet, Take 1 tablet (500 mg total) by mouth 2 (two) times a day with meals., Disp: 28 tablet, Rfl: 0   oxyCODONE  (ROXICODONE ) 10 mg immediate release tablet, Take 1 tablet (10 mg) by mouth every 4 hours as needed for pain, Disp: 30 tablet, Rfl: 0 Discharge Medication List as of 05/18/2024  8:34 PM     CONTINUE these medications which have NOT CHANGED   Details  acetaminophen  (TYLENOL ) 325 MG tablet Take 2 tablets (650 mg total) by mouth every four (4) hours as needed for pain., Starting Sat 07/19/2018, No Print    bacitracin 500 unit/gram ointment Apply 1 application  topically daily., Starting Sat 07/19/2018, Normal    cyclobenzaprine  HCl (FLEXERIL  ORAL) Take 10 mg by mouth two (2) times a day as needed. , Historical Med    eye patch Pads 1 patch by Miscellaneous route daily as needed., Starting Fri 10/09/2019, Normal    gabapentin (NEURONTIN) 400 MG capsule Take 400 mg by mouth Three (3) times a day., Historical Med    !! ibuprofen  (MOTRIN ) 600 MG tablet Take 1 tablet (600 mg total) by mouth every six (6) hours as needed for pain., Starting Thu 01/07/2020, Normal    !! ibuprofen  (MOTRIN ) 600 MG tablet Take 1 tablet (600 mg total) by mouth every eight (8) hours as needed for pain., Starting Wed 04/10/2023, Normal    JUNEL FE 1/20, 28, 1 mg-20 mcg (21)/75 mg (7) per tablet Take 1 tablet by mouth daily., Starting Mon 09/28/2019, Historical Med    naproxen  (NAPROSYN ) 500 MG tablet Take 1 tablet (500 mg total) by mouth 2 (two) times a day with meals., Starting Sat 07/19/2018, Normal    oxyCODONE  (ROXICODONE ) 10 mg immediate release tablet Take 1 tablet (10 mg) by mouth every 4 hours as needed for pain, Normal     !! - Potential duplicate medications found. Please discuss with provider.  Allergies:  Amoxicillin, Penicillins, Vancomycin , and Silvadene [silver sulfadiazine]  Past Surgical History:  Past Surgical History[2]  Social History:  Social History   Tobacco Use   Smoking status: Every Day    Current packs/day: 0.50    Types: Cigarettes   Smokeless tobacco: Never  Substance Use Topics   Alcohol use: Not Currently    Family History: Family History[3]   Physical Exam   Vital Signs:   BP 137/103   Pulse 102   Temp 36.4 C (97.5 F) (Skin)   Resp 16   LMP 05/18/2024 (Exact Date)   SpO2 100%   Breastfeeding No   General: Alert and oriented. Well-appearing and in no distress. Skin: Skin is warm and well-perfused. No rashes noted. HEENT: Normocephalic and atraumatic, moist mucous membranes, no nasal drainage, no intra-oral lesions  or erythema. Dental fracture of a right mandibular molar. Area of excoriation to inferior aspect of left ear canal with some surrounding dried blood. TM clear bilaterally. No nystagmus. No nuchal rigidity. Left-sided mastoid tenderness to palpation without overlying erythema. Left lateral neck tenderness to palpation.  Lungs: Normal respiratory effort. Breath sounds clear bilaterally without wheezes, crackles, or rhonchi. Heart: Rate as above, regular rhythm. No murmurs appreciated. Strong carotid pulses. Abdomen: Soft and non-tender to palpation, non-distended. Back: No midline C-spine tenderness to palpation. No step offs or deformities.  Extremities: No peripheral edema or clubbing. Pulses are normal and symmetric in upper and lower extremities. Neurological: Normal speech and language. No gross focal neurologic deficits are appreciated. Normal strength and sensation throughout. Cranial nerves 2-12 intact.  Psychiatric: Mood and affect are normal. Normal speech and behavior.   Radiology   XR Chest 2 views  Final Result  No radiographic evidence of acute cardiopulmonary process.            Labs   Labs Reviewed  COMPREHENSIVE METABOLIC PANEL - Abnormal; Notable for the following components:      Result Value   Chloride 97 (*)    CO2 35.2 (*)    BUN 6 (*)    Total Bilirubin <0.2 (*)    All other components within normal limits  INFLUENZA/RSV/COVID PCR - Normal   Narrative:    This test was performed using the Cepheid Xpert Xpress SARS-CoV-2/Flu/RSV plus assay, which has been validated by the CLIA-certified, CAP-inspected [facility]. FDA has granted Emergency Use Authorization for this test. Negative results do not preclude infection and should be interpreted along with clinical observations, patient history, and epidemiological information. Information for providers and patients can be found here:  [email or web address]  CBC W/ DIFFERENTIAL   Narrative:    The following orders were created for panel order CBC w/ Differential.                 Procedure                               Abnormality         Status                                    ---------                               -----------         ------  CBC w/ Differential[[id number]]                             Final result                                               Please view results for these tests on the individual orders.  CBC W/ AUTO DIFF   _____________________________________________________________________  Please note - This documentation was generated using dictation and/or voice recognition software, and as such, may contain spelling or other transcription errors. Any questions regarding the content of this documentation should be directed to the individual who electronically signed.  Documentation assistance was provided by Abigail Gillikin, Scribe on May 18, 2024 at 5:52 PM for Donnice Found, MD.  Documentation assistance was provided by the scribe in my presence.  The documentation recorded by the scribe has been reviewed by me and accurately reflects the services I personally performed.        [1] Past Medical History: Diagnosis Date   Anxiety    Bipolar 1 disorder    (CMS-HCC)    Chronic back pain    Hyperlipidemia    PTSD (post-traumatic stress disorder)    Vitamin D insufficiency   [2] Past Surgical History: Procedure Laterality Date   CHOLECYSTECTOMY     EYE SURGERY     kidney reflux     SKIN GRAFT    [3] No family history on file.  Found Donnice SAUNDERS, MD Resident 05/19/24 [phone]

## 2024-06-05 NOTE — ED Provider Notes (Signed)
 LWBS/AMA Patient Follow-Up Call  I attempted to contact patient at [phone] to discuss ED visit after LWBS after triage Patient's phone number is no longer working.

## 2024-07-19 ENCOUNTER — Emergency Department
Admission: EM | Admit: 2024-07-19 | Discharge: 2024-07-19 | Disposition: A | Discharge status: Home / Self Care | Attending: Emergency Medicine | Admitting: Emergency Medicine

## 2024-07-19 DIAGNOSIS — F1910 Other psychoactive substance abuse, uncomplicated: Secondary | ICD-10-CM | POA: Diagnosis present

## 2024-07-19 LAB — COMPREHENSIVE METABOLIC PANEL WITH GFR
ALT: 9 U/L (ref 0–44)
AST: 18 U/L (ref 15–41)
Albumin: 4.2 g/dL (ref 3.5–5.0)
Alkaline Phosphatase: 92 U/L (ref 38–126)
Anion gap: 12 (ref 5–15)
BUN: 8 mg/dL (ref 6–20)
CO2: 28 mmol/L (ref 22–32)
Calcium: 8.8 mg/dL — ABNORMAL LOW (ref 8.9–10.3)
Chloride: 99 mmol/L (ref 98–111)
Creatinine, Ser: 0.69 mg/dL (ref 0.44–1.00)
GFR, Estimated: >60 mL/min
Glucose, Bld: 125 mg/dL — ABNORMAL HIGH (ref 70–99)
Potassium: 3.7 mmol/L (ref 3.5–5.1)
Sodium: 138 mmol/L (ref 135–145)
Total Bilirubin: 0.2 mg/dL (ref 0.0–1.2)
Total Protein: 6.6 g/dL (ref 6.5–8.1)

## 2024-07-19 LAB — CBC WITH DIFFERENTIAL/PLATELET
Abs Immature Granulocytes: 0.02 K/uL (ref 0.00–0.07)
Basophils Absolute: 0.1 K/uL (ref 0.0–0.1)
Basophils Relative: 1 %
Eosinophils Absolute: 0.3 K/uL (ref 0.0–0.5)
Eosinophils Relative: 4 %
HCT: 42.3 % (ref 36.0–46.0)
Hemoglobin: 13.5 g/dL (ref 12.0–15.0)
Immature Granulocytes: 0 %
Lymphocytes Relative: 38 %
Lymphs Abs: 3.6 K/uL (ref 0.7–4.0)
MCH: 29.5 pg (ref 26.0–34.0)
MCHC: 31.9 g/dL (ref 30.0–36.0)
MCV: 92.4 fL (ref 80.0–100.0)
Monocytes Absolute: 0.6 K/uL (ref 0.1–1.0)
Monocytes Relative: 7 %
Neutro Abs: 4.9 K/uL (ref 1.7–7.7)
Neutrophils Relative %: 50 %
Platelets: 315 K/uL (ref 150–400)
RBC: 4.58 MIL/uL (ref 3.87–5.11)
RDW: 14.5 % (ref 11.5–15.5)
WBC: 9.6 K/uL (ref 4.0–10.5)
nRBC: 0 % (ref 0.0–0.2)

## 2024-07-19 LAB — SALICYLATE LEVEL: Salicylate Lvl: <7 mg/dL — ABNORMAL LOW (ref 7.0–30.0)

## 2024-07-19 LAB — MAGNESIUM: Magnesium: 2 mg/dL (ref 1.7–2.4)

## 2024-07-19 LAB — ACETAMINOPHEN LEVEL: Acetaminophen (Tylenol), Serum: <10 ug/mL — ABNORMAL LOW (ref 10–30)

## 2024-07-19 LAB — LIPASE, BLOOD: Lipase: 22 U/L (ref 11–51)

## 2024-07-19 MED ORDER — ONDANSETRON HCL 4 MG/2ML IJ SOLN
4.0000 mg | Freq: Once | INTRAMUSCULAR | Status: AC
  Administered 2024-07-19: 4 mg via INTRAVENOUS
  Filled 2024-07-19: qty 2

## 2024-07-19 MED ORDER — LACTATED RINGERS IV BOLUS
1000.0000 mL | Freq: Once | INTRAVENOUS | Status: AC
  Administered 2024-07-19: 1000 mL via INTRAVENOUS

## 2024-07-19 NOTE — ED Triage Notes (Signed)
 Patient presents to the ED via EMS for overdose. Patient reports of cannabis and cocaine use this evening. Currently answering questions appropriately, drowsy appearance on assessment, VSS, airway patent.

## 2024-07-19 NOTE — ED Notes (Addendum)
 Patient refusing to provide urine sample and requesting to be discharged. MD made aware.

## 2024-07-19 NOTE — ED Notes (Addendum)
 This RN entered patient's room. Patient demanding the doctor's name to be written down for her stating she's going to make a complaint of her care today. Patient also demanding name of RN that put her IV in. IV access removed, bleeding controlled. Discharge papers and coca cola given to patient.

## 2024-07-19 NOTE — ED Provider Notes (Addendum)
 "  [facility] Provider Note    Event Date/Time   First MD Initiated Contact with Patient 07/19/24 2125     (approximate)   History   Drug Overdose   HPI  Molly Bray is a 29 y.o. female who presents to the ED for evaluation of Drug Overdose   I reviewed outside ED visit from 2 months ago.  History of chronic pain syndrome, anxiety, bipolar and polysubstance abuse.  Patient presents to the ED from a private residence for evaluation of possible drug overdose.  Patient reports that she was at her friend's house waiting for her ride to come pick her up and bring her back home.  Reports using cocaine and cannabis products without IVDU.  No intent of self-harm.  She reports being on her menstrual period, sat on the toilet in the bathroom and must of fallen asleep.   Physical Exam   Triage Vital Signs: ED Triage Vitals  Encounter Vitals Group     BP      Girls Systolic BP Percentile      Girls Diastolic BP Percentile      Boys Systolic BP Percentile      Boys Diastolic BP Percentile      Pulse      Resp      Temp      Temp src      SpO2      Weight      Height      Head Circumference      Peak Flow      Pain Score      Pain Loc      Pain Education      Exclude from Growth Chart     Most recent vital signs: Vitals:   07/19/24 2137  BP: 109/81  Pulse: (!) 102  Resp: 12  Temp: 98.1 F (36.7 C)  SpO2: 100%    General: Awake, no distress.  Somewhat sleepy but with vocal stimulation, keeps her eyes open and has a conversation. CV:  Good peripheral perfusion.  Resp:  Normal effort.  Abd:  No distention.  Soft and nontender MSK:  No deformity noted.  Neuro:  No focal deficits appreciated. Other:     ED Results / Procedures / Treatments   Labs (all labs ordered are listed, but only abnormal results are displayed) Labs Reviewed  ACETAMINOPHEN  LEVEL - Abnormal; Notable for the following components:      Result Value    Acetaminophen  (Tylenol ), Serum <10 (*)    All other components within normal limits  SALICYLATE LEVEL - Abnormal; Notable for the following components:   Salicylate Lvl <7.0 (*)    All other components within normal limits  COMPREHENSIVE METABOLIC PANEL WITH GFR - Abnormal; Notable for the following components:   Glucose, Bld 125 (*)    Calcium  8.8 (*)    All other components within normal limits  LIPASE, BLOOD  CBC WITH DIFFERENTIAL/PLATELET  MAGNESIUM   URINALYSIS, ROUTINE W REFLEX MICROSCOPIC  URINE DRUG SCREEN  POC URINE PREG, ED    EKG   RADIOLOGY   Official radiology report(s): No results found.  PROCEDURES and INTERVENTIONS:  Procedures  Medications  lactated ringers  bolus 1,000 mL (1,000 mLs Intravenous New Bag/Given 07/19/24 2202)  ondansetron  (ZOFRAN ) injection 4 mg (4 mg Intravenous Given 07/19/24 2202)     IMPRESSION / MDM / ASSESSMENT AND PLAN / ED COURSE  I reviewed the triage vital signs and the nursing notes.  Differential diagnosis includes, but is not limited to, intentional overdose, accidental overdose, polysubstance abuse  {Patient presents with symptoms of an acute illness or injury that is potentially life-threatening.  Patient presents after concern for possible accidental drug overdose.  She is awake and alert on arrival to the ED, somewhat sleepy and mildly intoxicated but generally looks well.  No indication for Narcan .  She denies self-harm.  Provide supportive measures with fluids and antiemetics, she is tolerating p.o. intake.  Awaiting UA/pregnancy test at the time of signout to oncoming physician.  Anticipate outpatient management.  Around the time of signout and went to reassess the patient, and as below, she is refusing any further workup or management.  Seems to have capacity, does look more awake than initial arrival.  I have a low suspicion for any acute medical pathology anyways.  Clinical Course as of 07/19/24 2252  [place] Jul 19, 2024   2213 Reassessed, awake and alert. Law enforcement at bedside talking with patient [DS]  2252 Reassessed, patient requesting discharge.  Reports that she is fine and has no need to be here.  We discussed return precautions [DS]    Clinical Course User Index [DS] Claudene Rover, MD     FINAL CLINICAL IMPRESSION(S) / ED DIAGNOSES   Final diagnoses:  Polysubstance abuse (HCC)     Rx / DC Orders   ED Discharge Orders     None        Note:  This document was prepared using Dragon voice recognition software and may include unintentional dictation errors.   Claudene Rover, MD 07/19/24 2250    Claudene Rover, MD 07/19/24 2253  "

## 2024-07-27 ENCOUNTER — Ambulatory Visit: Admitting: Obstetrics & Gynecology
